# Patient Record
Sex: Female | Born: 1975 | Race: White | Hispanic: No | Marital: Single | Smoking: Never smoker
Health system: Southern US, Community
[De-identification: ages and names within clinical notes are randomized; demographics above are authoritative.]

## PROBLEM LIST (undated history)

## (undated) DIAGNOSIS — F32A Depression, unspecified: Secondary | ICD-10-CM

## (undated) DIAGNOSIS — G43909 Migraine, unspecified, not intractable, without status migrainosus: Secondary | ICD-10-CM

## (undated) DIAGNOSIS — D649 Anemia, unspecified: Secondary | ICD-10-CM

## (undated) DIAGNOSIS — R112 Nausea with vomiting, unspecified: Secondary | ICD-10-CM

## (undated) DIAGNOSIS — R011 Cardiac murmur, unspecified: Secondary | ICD-10-CM

## (undated) DIAGNOSIS — F329 Major depressive disorder, single episode, unspecified: Secondary | ICD-10-CM

## (undated) DIAGNOSIS — E039 Hypothyroidism, unspecified: Secondary | ICD-10-CM

## (undated) DIAGNOSIS — I509 Heart failure, unspecified: Secondary | ICD-10-CM

## (undated) DIAGNOSIS — Z8719 Personal history of other diseases of the digestive system: Secondary | ICD-10-CM

## (undated) DIAGNOSIS — I10 Essential (primary) hypertension: Secondary | ICD-10-CM

## (undated) DIAGNOSIS — F419 Anxiety disorder, unspecified: Secondary | ICD-10-CM

## (undated) DIAGNOSIS — Z9889 Other specified postprocedural states: Secondary | ICD-10-CM

## (undated) DIAGNOSIS — K219 Gastro-esophageal reflux disease without esophagitis: Secondary | ICD-10-CM

## (undated) HISTORY — PX: SMALL INTESTINE SURGERY: SHX150

## (undated) HISTORY — DX: Migraine, unspecified, not intractable, without status migrainosus: G43.909

## (undated) HISTORY — PX: BUNIONECTOMY: SHX129

## (undated) HISTORY — PX: CHOLECYSTECTOMY: SHX55

## (undated) HISTORY — DX: Heart failure, unspecified: I50.9

## (undated) HISTORY — DX: Cardiac murmur, unspecified: R01.1

## (undated) HISTORY — PX: HERNIA REPAIR: SHX51

## (undated) HISTORY — PX: TONSILLECTOMY: SUR1361

## (undated) HISTORY — PX: ABDOMINAL HYSTERECTOMY: SHX81

## (undated) HISTORY — PX: PARTIAL GASTRECTOMY: SHX2172

## (undated) HISTORY — PX: HIATAL HERNIA REPAIR: SHX195

---

## 1998-08-28 ENCOUNTER — Other Ambulatory Visit: Admission: RE | Admit: 1998-08-28 | Discharge: 1998-08-28 | Payer: Self-pay | Admitting: Obstetrics and Gynecology

## 1999-04-03 ENCOUNTER — Other Ambulatory Visit: Admission: RE | Admit: 1999-04-03 | Discharge: 1999-04-03 | Payer: Self-pay | Admitting: Obstetrics and Gynecology

## 1999-11-11 ENCOUNTER — Inpatient Hospital Stay (HOSPITAL_COMMUNITY): Admission: AD | Admit: 1999-11-11 | Discharge: 1999-11-14 | Payer: Self-pay | Admitting: Obstetrics and Gynecology

## 1999-12-21 ENCOUNTER — Other Ambulatory Visit: Admission: RE | Admit: 1999-12-21 | Discharge: 1999-12-21 | Payer: Self-pay | Admitting: Obstetrics and Gynecology

## 2002-06-04 ENCOUNTER — Other Ambulatory Visit: Admission: RE | Admit: 2002-06-04 | Discharge: 2002-06-04 | Payer: Self-pay | Admitting: Obstetrics and Gynecology

## 2003-07-15 ENCOUNTER — Other Ambulatory Visit: Admission: RE | Admit: 2003-07-15 | Discharge: 2003-07-15 | Payer: Self-pay | Admitting: Obstetrics and Gynecology

## 2005-04-12 ENCOUNTER — Ambulatory Visit: Payer: Self-pay | Admitting: Internal Medicine

## 2006-09-02 ENCOUNTER — Ambulatory Visit: Payer: Self-pay | Admitting: Internal Medicine

## 2006-09-19 ENCOUNTER — Ambulatory Visit: Payer: Self-pay | Admitting: Internal Medicine

## 2006-09-19 ENCOUNTER — Encounter: Payer: Self-pay | Admitting: Internal Medicine

## 2006-09-21 ENCOUNTER — Ambulatory Visit (HOSPITAL_COMMUNITY): Admission: RE | Admit: 2006-09-21 | Discharge: 2006-09-21 | Payer: Self-pay | Admitting: Internal Medicine

## 2006-10-30 ENCOUNTER — Emergency Department: Payer: Self-pay | Admitting: Emergency Medicine

## 2006-11-02 ENCOUNTER — Emergency Department: Payer: Self-pay | Admitting: Emergency Medicine

## 2006-11-06 ENCOUNTER — Emergency Department: Payer: Self-pay | Admitting: Emergency Medicine

## 2006-11-13 ENCOUNTER — Emergency Department: Payer: Self-pay | Admitting: Unknown Physician Specialty

## 2006-11-27 ENCOUNTER — Emergency Department: Payer: Self-pay | Admitting: Emergency Medicine

## 2006-12-20 ENCOUNTER — Ambulatory Visit: Payer: Self-pay | Admitting: Internal Medicine

## 2006-12-26 ENCOUNTER — Ambulatory Visit (HOSPITAL_COMMUNITY): Admission: RE | Admit: 2006-12-26 | Discharge: 2006-12-26 | Payer: Self-pay | Admitting: Surgery

## 2007-01-02 ENCOUNTER — Encounter: Admission: RE | Admit: 2007-01-02 | Discharge: 2007-02-21 | Payer: Self-pay | Admitting: Surgery

## 2007-01-05 ENCOUNTER — Ambulatory Visit (HOSPITAL_COMMUNITY): Admission: RE | Admit: 2007-01-05 | Discharge: 2007-01-05 | Payer: Self-pay | Admitting: Surgery

## 2007-01-17 ENCOUNTER — Ambulatory Visit: Payer: Self-pay | Admitting: Internal Medicine

## 2007-01-17 ENCOUNTER — Encounter: Payer: Self-pay | Admitting: Internal Medicine

## 2007-02-12 ENCOUNTER — Ambulatory Visit: Payer: Self-pay | Admitting: Internal Medicine

## 2007-03-20 ENCOUNTER — Ambulatory Visit (HOSPITAL_COMMUNITY): Admission: RE | Admit: 2007-03-20 | Discharge: 2007-03-20 | Payer: Self-pay | Admitting: Surgery

## 2007-05-31 ENCOUNTER — Ambulatory Visit (HOSPITAL_COMMUNITY): Admission: RE | Admit: 2007-05-31 | Discharge: 2007-05-31 | Payer: Self-pay | Admitting: Surgery

## 2007-09-05 ENCOUNTER — Ambulatory Visit: Payer: Self-pay | Admitting: Internal Medicine

## 2007-09-05 DIAGNOSIS — K589 Irritable bowel syndrome without diarrhea: Secondary | ICD-10-CM | POA: Insufficient documentation

## 2007-09-05 DIAGNOSIS — L409 Psoriasis, unspecified: Secondary | ICD-10-CM | POA: Insufficient documentation

## 2007-09-05 DIAGNOSIS — I10 Essential (primary) hypertension: Secondary | ICD-10-CM | POA: Insufficient documentation

## 2007-09-05 DIAGNOSIS — F341 Dysthymic disorder: Secondary | ICD-10-CM | POA: Insufficient documentation

## 2007-09-05 DIAGNOSIS — E039 Hypothyroidism, unspecified: Secondary | ICD-10-CM | POA: Insufficient documentation

## 2007-09-05 DIAGNOSIS — D131 Benign neoplasm of stomach: Secondary | ICD-10-CM | POA: Insufficient documentation

## 2007-09-05 DIAGNOSIS — A048 Other specified bacterial intestinal infections: Secondary | ICD-10-CM | POA: Insufficient documentation

## 2007-09-05 DIAGNOSIS — G43909 Migraine, unspecified, not intractable, without status migrainosus: Secondary | ICD-10-CM | POA: Insufficient documentation

## 2007-09-05 DIAGNOSIS — Z8679 Personal history of other diseases of the circulatory system: Secondary | ICD-10-CM | POA: Insufficient documentation

## 2008-05-24 HISTORY — PX: PARTIAL GASTRECTOMY: SHX2172

## 2010-02-17 ENCOUNTER — Ambulatory Visit: Payer: Self-pay

## 2010-10-06 NOTE — Assessment & Plan Note (Signed)
[facility]                         GASTROENTEROLOGY OFFICE NOTE   NAME:Carol Mahoney, Carol Mahoney                          MRN:          [id number]  DATE:12/20/2006                            DOB:          1975/08/09    CHIEF COMPLAINT:  Discuss surgery, and irritable bowel acting up.   [name] has been having problems with lower abdominal cramps and diarrhea  as with her irritable bowel before.  She is under some stress, trying to  buy a house.  There is some work stress, and she is contemplating  surgery with Dr. Ezzard Standing.  She has an intrathoracic stomach, essentially,  or at least a large portion of her stomach in her chest, with a  foreshortened esophagus.  Dr. Ezzard Standing has suggested that laparoscopic  obesity surgery with bypass would actually improve her chances of hiatal  hernia repair being more durable and also help with her life and  reducing morbidity and mortality.  She is probably going to do this.  She is concerned about the possibility of exacerbation of irritable  bowel problems.  Her mother had the surgery, so she knows that diet  restriction is an important part of that process.  She is not having any  bleeding or fever or chills.   MEDICATIONS:  1. Listed and reviewed in the chart.  2. Remains on Nexium daily.  3. Wellbutrin has been discontinued since I saw her last.  4. Not using any antispasmodics, though using some intermittent      Imodium.   PAST MEDICAL HISTORY:  See September 02, 2006 note.  See EGD, September 19, 2006, as well as upper GI series.  These show her large hiatal hernia.  There were Helicobacter pylori organisms present, September 20, 2006.  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  She is not complaining of  dysphagia at this time.   PHYSICAL EXAMINATION:  GENERAL:  Obese.  No acute distress.  VITAL SIGNS:  Weight 261 pounds, height 5 feet 7 inches, pulse 92,  blood  pressure 110/66.   ASSESSMENT:  1. Hiatal hernia, large.  2. Obesity.  3. Foreshortened esophagus.  4. Diarrhea, probably from irritable bowel.  She has never had a      colonoscopy.   PLAN:  1. I think a colonoscopy with random biopsies makes sense, given her      chronic intermittent diarrhea.  I suspect this is irritable bowel,      but if she is about to undergo the surgery mentioned above, it      would be prudent and reasonable to perform a colonoscopy and take      random biopsies.  2. Dicyclomine 10-20 mg q.6 h. p.r.n. crampy abdominal pain and      diarrhea.  She is going to take it daily right now.  3. Regarding the surgery, I think this is reasonable and actually      makes sense, given her overall problems.  I do not know of any  increased incidence of irritable bowel problems after this.  I have      told her this, though I told her she certainly could have change in      bowels and other abdominal symptoms, but usually there is an      adaptation phase.  She should ask Dr. Ezzard Standing further about this as      well.     Iva Boop, MD,FACG  Electronically Signed    CEG/MedQ  DD: 12/20/2006  DT: 12/21/2006  Job #: [id number]   cc:   [place]ell [place]. Ezzard Standing, M.D.

## 2010-10-09 NOTE — Assessment & Plan Note (Signed)
[facility]                         GASTROENTEROLOGY OFFICE NOTE   NAME:Carol Mahoney, Carol Mahoney                          MRN:          [id number]  DATE:09/02/2006                            DOB:          1976/01/18    REFERRING PHYSICIAN:  Dale Hosmer   REASON FOR CONSULTATION:  Dysphagia.   ASSESSMENT:  Dysphagia in the setting of chronic gastroesophageal reflux  disease.   RECOMMENDATIONS AND PLAN:  1. Schedule upper GI endoscopy with possible esophageal dilation. The      risks, benefits and indications are explained.  2. Continue Nexium, samples given to help her go to the b.i.d. dosing      Dr. Lorin Picket recommended.  3. She also has irritable bowel syndrome which seems to be under      reasonable control with dietary restriction of fiber i.e. low fiber      diet and occasional Imodium. Will monitor that.   HISTORY:  This is a 35 year old, white woman that I know from previous  irritable bowel and reflux disease evaluation in 2006. I have not seen  her since then. She has been to see Dr. Lorin Picket on August 17, 2006 and was  complaining of intermittent solid food dysphagia and food moving down  slowly. This has not really been a problem before. Nexium helps her  heartburn quite a bit but if she misses a dose she has a terrible acid  rebound and surge that is not pleasant to say the least. Intermittent  diarrhea as described above generally controlled by avoiding salad and  fiber, etc. Imodium will help with the diarrhea. She thinks that is  reasonable at this point. She has a stressful life being a single mom  and divorced as well as a stressful job. She has been very nauseous  lately. She says her stomach feels empty and she will end up eating and  she is gaining weight because of that. The nausea does not seem to be  related to the Nexium which she has been on for about a year. When she  was seen here previously, she was on Protonix.   MEDICATIONS:  1.  Triamterene/HCTZ 37.5/25 mg daily.  2. Synthroid 0.125 mg daily.  3. Lexapro 20 mg daily.  4. Wellbutrin XL 300 mg daily.  5. Nexium 40 mg daily.  6. Ambien CR12.5 mg 1/2 at bedtime.  7. Imitrex p.r.n.  8. Phenergan p.r.n.   ALLERGIES:  PENICILLIN and SULFA.   PAST MEDICAL HISTORY:  1. Irritable bowel syndrome.  2. Gastroesophageal reflux disease since teenage years.  Previous      endoscopy and pH probe testing in [place] (Dr. Sharolyn Douglas).  3. Hypertension.  4. Obesity.  5. Psoriasis.  6. Tonsillectomy.  7. Hypothyroidism with previous Hashimoto thyroiditis.  8. Allergies and sinus problems.  9. Prior tonsillectomy.  10.Migraine headaches.  11.Anxiety and depression.   FAMILY HISTORY:  An aunt had rectal cancer, an uncle had diabetes. No  first degree relatives with problems. Review is otherwise negative.   SOCIAL HISTORY:  She is divorced. She does drink caffeine in the form  of  coke as that helps her nausea at times. She has one daughter whose 6 and  who is strong willed. No alcohol, tobacco or drugs.   REVIEW OF SYSTEMS:  Positive for insomnia and chronic back pain. All  other systems negative except as mentioned above.   PHYSICAL EXAMINATION:  VITAL SIGNS:  Height 5 foot 7, weight 266 pounds,  blood pressure 126/80, pulse 80.  GENERAL:  She is an obese, white woman in no acute distress.  EYES:  Anicteric.  ENT:  Normal mouth, nose and pharynx.  NECK:  Supple, no thyromegaly or mass.  CHEST:  Clear.  HEART:  S1, S2. No murmurs, rubs or gallops.  ABDOMEN:  Soft, obese, nontender, no organomegaly or mass.  LYMPHATIC:  No neck or supraclavicular nodes.  EXTREMITIES:  No edema in the lower extremities.  SKIN:  Warm and dry, no rash.  PSYCH:  She is alert and oriented x3.   Note, she has this nausea problem as well as heartburn issues. I did  give her a prescription for promethazine 25 mg #12 with 1 refill to be  used as needed for severe nausea as she requested that. All  of this  could be related to reflux disease though I think there is some  irritable bowel overlay. Will need to look for the possibility of a  stricture and possible empiric dilation would be considered at her  endoscopy.   I appreciate the opportunity to care for this patient.     Iva Boop, MD,FACG  Electronically Signed    CEG/MedQ  DD: 09/02/2006  DT: 09/02/2006  Job #: [id number]   cc:   Dale Gen[place]
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
## 2011-02-01 DIAGNOSIS — W57XXXA Bitten or stung by nonvenomous insect and other nonvenomous arthropods, initial encounter: Secondary | ICD-10-CM | POA: Insufficient documentation

## 2011-02-16 DIAGNOSIS — Z9049 Acquired absence of other specified parts of digestive tract: Secondary | ICD-10-CM | POA: Insufficient documentation

## 2011-02-16 DIAGNOSIS — K449 Diaphragmatic hernia without obstruction or gangrene: Secondary | ICD-10-CM | POA: Insufficient documentation

## 2011-02-16 DIAGNOSIS — I7300 Raynaud's syndrome without gangrene: Secondary | ICD-10-CM | POA: Insufficient documentation

## 2011-02-16 DIAGNOSIS — K219 Gastro-esophageal reflux disease without esophagitis: Secondary | ICD-10-CM | POA: Insufficient documentation

## 2011-03-03 ENCOUNTER — Other Ambulatory Visit: Payer: Self-pay | Admitting: Internal Medicine

## 2011-03-04 ENCOUNTER — Inpatient Hospital Stay: Payer: Self-pay | Admitting: Specialist

## 2011-10-14 ENCOUNTER — Encounter (HOSPITAL_COMMUNITY): Payer: Self-pay | Admitting: Pharmacist

## 2011-10-20 ENCOUNTER — Encounter (HOSPITAL_COMMUNITY)
Admission: RE | Admit: 2011-10-20 | Discharge: 2011-10-20 | Disposition: A | Discharge status: Home / Self Care | Payer: Managed Care, Other (non HMO) | Source: Ambulatory Visit | Attending: Obstetrics and Gynecology | Admitting: Obstetrics and Gynecology

## 2011-10-20 ENCOUNTER — Encounter (HOSPITAL_COMMUNITY): Payer: Self-pay

## 2011-10-20 ENCOUNTER — Other Ambulatory Visit: Payer: Self-pay | Admitting: Obstetrics and Gynecology

## 2011-10-20 HISTORY — DX: Gastro-esophageal reflux disease without esophagitis: K21.9

## 2011-10-20 HISTORY — DX: Depression, unspecified: F32.A

## 2011-10-20 HISTORY — DX: Nausea with vomiting, unspecified: R11.2

## 2011-10-20 HISTORY — DX: Personal history of other diseases of the digestive system: Z87.19

## 2011-10-20 HISTORY — DX: Anxiety disorder, unspecified: F41.9

## 2011-10-20 HISTORY — DX: Major depressive disorder, single episode, unspecified: F32.9

## 2011-10-20 HISTORY — DX: Hypothyroidism, unspecified: E03.9

## 2011-10-20 HISTORY — DX: Anemia, unspecified: D64.9

## 2011-10-20 HISTORY — DX: Other specified postprocedural states: Z98.890

## 2011-10-20 HISTORY — DX: Essential (primary) hypertension: I10

## 2011-10-20 LAB — BASIC METABOLIC PANEL
BUN: 13 mg/dL (ref 6–23)
CO2: 34 mEq/L — ABNORMAL HIGH (ref 19–32)
Chloride: 96 mEq/L (ref 96–112)
GFR calc non Af Amer: >90 mL/min (ref >90)
Glucose, Bld: 76 mg/dL (ref 70–99)
Potassium: 3.2 mEq/L — ABNORMAL LOW (ref 3.5–5.1)
Sodium: 135 mEq/L (ref 135–145)

## 2011-10-20 LAB — CBC
HCT: 30.8 % — ABNORMAL LOW (ref 36.0–46.0)
Hemoglobin: 9.2 g/dL — ABNORMAL LOW (ref 12.0–15.0)
MCHC: 29.9 g/dL — ABNORMAL LOW (ref 30.0–36.0)
RBC: 4.43 MIL/uL (ref 3.87–5.11)

## 2011-10-20 LAB — SURGICAL PCR SCREEN: Staphylococcus aureus: POSITIVE — AB

## 2011-10-20 NOTE — Pre-Procedure Instructions (Signed)
I informed Dr. Arby Barrette of pt's low K+ result of 3.2 ( protocol is to notify anes.if K+ < 3.1) but surgery is week away and potential for further decrease of K+ is possible. Dr Arby Barrette instructed me to notify Dr. Billy Coast and have him order K-Dur 20 for one week. I notified pt first to obtain pharmacy info and pt prefers to have her PMD handle K+ supplement because this is a chronic issue for her & the PMD is aware of this. I notified Dr. Silvestre Mesi' office [facility]) to make them aware.

## 2011-10-20 NOTE — Pre-Procedure Instructions (Signed)
Pt is allergic to Chlorhexedine so I did not give her the Bactoshield skin prep.

## 2011-10-20 NOTE — Patient Instructions (Signed)
YOUR PROCEDURE IS SCHEDULED ON:10/29/11  ENTER THROUGH THE MAIN ENTRANCE OF [facility] AT:1145am  USE DESK PHONE AND DIAL [phone] TO INFORM us OF YOUR ARRIVAL  CALL [phone] IF YOU HAVE ANY QUESTIONS OR PROBLEMS PRIOR TO YOUR ARRIVAL.  REMEMBER: DO NOT EAT AFTER MIDNIGHT :Thursday  SPECIAL INSTRUCTIONS:clear liquids ok until 9am on Fri   YOU MAY BRUSH YOUR TEETH THE MORNING OF SURGERY   TAKE THESE MEDICINES THE DAY OF SURGERY WITH SIP OF WATER:Pristiq, Synthroid, Maxide, Protonix   DO NOT WEAR JEWELRY, EYE MAKEUP, LIPSTICK OR DARK FINGERNAIL POLISH DO NOT WEAR LOTIONS  DO NOT SHAVE FOR 48 HOURS PRIOR TO SURGERY  YOU WILL NOT BE ALLOWED TO DRIVE YOURSELF HOME.  NAME OF DRIVER:mother: Queen Blossom

## 2011-10-22 NOTE — Pre-Procedure Instructions (Signed)
Received call from pt.:( voice mail) she said her PMD wants me to call in RX to pt pharmacy,so I have left msg for Carol Mahoney to ask Dr. Billy Coast to call in rx for K-Dur as originally advised by Dr. Arby Barrette

## 2011-10-28 NOTE — H&P (Signed)
NAME:  Carol Mahoney, PAPIN NO.:  [id number]  MEDICAL RECORD NO.:  [id number]  LOCATION:  [facility]                         FACILITY:  [facility]  PHYSICIAN:  Lenoard Aden, M.D.DATE OF BIRTH:  06-08-1975  DATE OF ADMISSION:  10/12/2011 DATE OF DISCHARGE:                             HISTORY & PHYSICAL   CHIEF COMPLAINT:  Dysmenorrhea and menorrhagia.  HISTORY OF PRESENT ILLNESS:  She is a 36 year old Caucasian female G 1, P 1, who presents with worsening dysmenorrhea and menorrhagia for definitive therapy.  MEDICATIONS:  Include potassium supplementation, Pristiq, Xanax p.r.n., Protonix, HCTZ, multivitamin, Imitrex, p.r.n. and Synthroid.  She has K2, iodine, Carafate, maintain, sulfa, penicillin, ChloraPrep One-Step and vancomycin.  FAMILY HISTORY:  Rectal cancer, diabetes, varicosities.  ALLERGIES:  Normal hypertension and MI.  PAST SURGICAL HISTORY:  Vaginal delivery x1, history of cholecystectomy, partial gastrectomy, endoscopy, foot surgery and tonsillectomy.  MEDICAL HISTORY:  Remarkable for obstructive cardiomyopathy, otherwise normal cardiac preoperatively.  PHYSICAL EXAM:  General:  This is a well-developed, well-nourished white female weight female.  Height 67 inches, weight of 187 pounds.  HEENT normal.  Neck:  Supple.  Full range of motion.  Lungs:  Clear.  Heart: Regular RR.  Abdomen:  Soft, nontender.  Pelvic exam revealed an anteflexed uterus.  No adnexal masses.  Extremities:  No cords. Neurological:  Nonfocal.  Skin is intact.  IMPRESSION:  Severe dysmenorrhea, menorrhagia for definitive therapy.  PLAN:  Admit.  To proceed with TAH/bilateral salpingectomy.  Risks of anesthesia, infection, bleeding, abdominal incision is discussed. Delayed versus immediate complications to include bowel or bladder injury noted.  The cardiac records obtained discussed with the patient cleared for operative procedure.  The patient acknowledges risks of surgery and  will proceed.    Lenoard Aden, M.D.    RJT/MEDQ  D:  10/28/2011  T:  10/28/2011  Job:  [id number]

## 2011-10-29 ENCOUNTER — Encounter (HOSPITAL_COMMUNITY)
Admission: RE | Disposition: A | Discharge status: Home / Self Care | Payer: Self-pay | Source: Ambulatory Visit | Attending: Obstetrics and Gynecology

## 2011-10-29 ENCOUNTER — Ambulatory Visit (HOSPITAL_COMMUNITY)
Admission: RE | Admit: 2011-10-29 | Discharge: 2011-10-30 | Disposition: A | Discharge status: Home / Self Care | Payer: Managed Care, Other (non HMO) | Source: Ambulatory Visit | Attending: Obstetrics and Gynecology | Admitting: Obstetrics and Gynecology

## 2011-10-29 ENCOUNTER — Ambulatory Visit (HOSPITAL_COMMUNITY): Payer: Managed Care, Other (non HMO) | Admitting: Anesthesiology

## 2011-10-29 ENCOUNTER — Encounter (HOSPITAL_COMMUNITY): Payer: Self-pay | Admitting: Anesthesiology

## 2011-10-29 ENCOUNTER — Encounter (HOSPITAL_COMMUNITY): Payer: Self-pay | Admitting: *Deleted

## 2011-10-29 DIAGNOSIS — D649 Anemia, unspecified: Secondary | ICD-10-CM | POA: Insufficient documentation

## 2011-10-29 DIAGNOSIS — Z01812 Encounter for preprocedural laboratory examination: Secondary | ICD-10-CM | POA: Insufficient documentation

## 2011-10-29 DIAGNOSIS — N946 Dysmenorrhea, unspecified: Secondary | ICD-10-CM | POA: Insufficient documentation

## 2011-10-29 DIAGNOSIS — N920 Excessive and frequent menstruation with regular cycle: Secondary | ICD-10-CM | POA: Insufficient documentation

## 2011-10-29 DIAGNOSIS — I10 Essential (primary) hypertension: Secondary | ICD-10-CM | POA: Insufficient documentation

## 2011-10-29 DIAGNOSIS — N803 Endometriosis of pelvic peritoneum, unspecified: Secondary | ICD-10-CM | POA: Insufficient documentation

## 2011-10-29 DIAGNOSIS — Z01818 Encounter for other preprocedural examination: Secondary | ICD-10-CM | POA: Insufficient documentation

## 2011-10-29 LAB — ABO/RH: ABO/RH(D): A POS

## 2011-10-29 LAB — TYPE AND SCREEN
ABO/RH(D): A POS
Antibody Screen: NEGATIVE

## 2011-10-29 SURGERY — ROBOTIC ASSISTED TOTAL HYSTERECTOMY
Anesthesia: General | Site: Abdomen | Wound class: Clean Contaminated

## 2011-10-29 MED ORDER — TRAMADOL HCL 50 MG PO TABS: 50.0000 mg | ORAL_TABLET | Freq: Four times a day (QID) | ORAL | Status: DC | PRN

## 2011-10-29 MED ORDER — LACTATED RINGERS IR SOLN
Status: DC | PRN
  Administered 2011-10-29: 3000 mL

## 2011-10-29 MED ORDER — MIDAZOLAM HCL 5 MG/5ML IJ SOLN
INTRAMUSCULAR | Status: DC | PRN
  Administered 2011-10-29: 2 mg via INTRAVENOUS

## 2011-10-29 MED ORDER — LIDOCAINE HCL (CARDIAC) 20 MG/ML IV SOLN
INTRAVENOUS | Status: DC | PRN
  Administered 2011-10-29: 75 mg via INTRAVENOUS

## 2011-10-29 MED ORDER — BUPIVACAINE HCL (PF) 0.25 % IJ SOLN
INTRAMUSCULAR | Status: AC
  Filled 2011-10-29: qty 30

## 2011-10-29 MED ORDER — DEXAMETHASONE SODIUM PHOSPHATE 4 MG/ML IJ SOLN
INTRAMUSCULAR | Status: DC | PRN
  Administered 2011-10-29: 10 mg via INTRAVENOUS

## 2011-10-29 MED ORDER — HYDROMORPHONE HCL PF 1 MG/ML IJ SOLN: 0.2500 mg | INTRAMUSCULAR | Status: DC | PRN

## 2011-10-29 MED ORDER — PHENYLEPHRINE HCL 10 MG/ML IJ SOLN
INTRAMUSCULAR | Status: DC | PRN
  Administered 2011-10-29: 120 ug via INTRAVENOUS
  Administered 2011-10-29: 40 ug via INTRAVENOUS

## 2011-10-29 MED ORDER — LEVOTHYROXINE SODIUM 100 MCG PO TABS
100.0000 ug | ORAL_TABLET | Freq: Every day | ORAL | Status: DC
  Administered 2011-10-30: 100 ug via ORAL
  Filled 2011-10-29 (×2): qty 1

## 2011-10-29 MED ORDER — NEOSTIGMINE METHYLSULFATE 1 MG/ML IJ SOLN
INTRAMUSCULAR | Status: DC | PRN
  Administered 2011-10-29: 3 mg via INTRAVENOUS

## 2011-10-29 MED ORDER — ATROPINE SULFATE 0.4 MG/ML IJ SOLN
INTRAMUSCULAR | Status: DC | PRN
  Administered 2011-10-29: 0.4 mg via INTRAVENOUS

## 2011-10-29 MED ORDER — MEPERIDINE HCL 25 MG/ML IJ SOLN: 6.2500 mg | INTRAMUSCULAR | Status: DC | PRN

## 2011-10-29 MED ORDER — ZOLPIDEM TARTRATE 5 MG PO TABS: 5.0000 mg | ORAL_TABLET | Freq: Every evening | ORAL | Status: DC | PRN

## 2011-10-29 MED ORDER — CEFAZOLIN SODIUM 1-5 GM-% IV SOLN
INTRAVENOUS | Status: AC
  Administered 2011-10-29: 1 g via INTRAVENOUS
  Filled 2011-10-29: qty 50

## 2011-10-29 MED ORDER — PROPOFOL 10 MG/ML IV EMUL
INTRAVENOUS | Status: AC
  Filled 2011-10-29: qty 20

## 2011-10-29 MED ORDER — GLYCOPYRROLATE 0.2 MG/ML IJ SOLN
INTRAMUSCULAR | Status: AC
  Filled 2011-10-29: qty 1

## 2011-10-29 MED ORDER — ROCURONIUM BROMIDE 50 MG/5ML IV SOLN
INTRAVENOUS | Status: AC
  Filled 2011-10-29: qty 2

## 2011-10-29 MED ORDER — FENTANYL CITRATE 0.05 MG/ML IJ SOLN
INTRAMUSCULAR | Status: AC
  Administered 2011-10-29: 50 ug via INTRAVENOUS
  Filled 2011-10-29: qty 2

## 2011-10-29 MED ORDER — MIDAZOLAM HCL 2 MG/2ML IJ SOLN
INTRAMUSCULAR | Status: AC
  Filled 2011-10-29: qty 2

## 2011-10-29 MED ORDER — DEXAMETHASONE SODIUM PHOSPHATE 10 MG/ML IJ SOLN
INTRAMUSCULAR | Status: AC
  Filled 2011-10-29: qty 1

## 2011-10-29 MED ORDER — PHENYLEPHRINE HCL 10 MG/ML IJ SOLN
10000.0000 ug | INTRAVENOUS | Status: DC | PRN
  Administered 2011-10-29: 6.67 ug/min via INTRAVENOUS

## 2011-10-29 MED ORDER — ONDANSETRON HCL 4 MG/2ML IJ SOLN: 4.0000 mg | Freq: Four times a day (QID) | INTRAMUSCULAR | Status: DC | PRN

## 2011-10-29 MED ORDER — DIPHENHYDRAMINE HCL 50 MG/ML IJ SOLN: 12.5000 mg | Freq: Four times a day (QID) | INTRAMUSCULAR | Status: DC | PRN

## 2011-10-29 MED ORDER — OXYCODONE-ACETAMINOPHEN 5-325 MG PO TABS
1.0000 | ORAL_TABLET | ORAL | Status: DC | PRN
  Administered 2011-10-30: 1 via ORAL
  Filled 2011-10-29: qty 1

## 2011-10-29 MED ORDER — NEOSTIGMINE METHYLSULFATE 1 MG/ML IJ SOLN
INTRAMUSCULAR | Status: AC
  Filled 2011-10-29: qty 10

## 2011-10-29 MED ORDER — ROCURONIUM BROMIDE 100 MG/10ML IV SOLN
INTRAVENOUS | Status: DC | PRN
  Administered 2011-10-29 (×2): 10 mg via INTRAVENOUS
  Administered 2011-10-29: 40 mg via INTRAVENOUS

## 2011-10-29 MED ORDER — DIPHENHYDRAMINE HCL 12.5 MG/5ML PO ELIX
12.5000 mg | ORAL_SOLUTION | Freq: Four times a day (QID) | ORAL | Status: DC | PRN
  Filled 2011-10-29: qty 5

## 2011-10-29 MED ORDER — HYDROMORPHONE HCL PF 1 MG/ML IJ SOLN
INTRAMUSCULAR | Status: AC
  Administered 2011-10-29: 0.5 mg
  Filled 2011-10-29: qty 1

## 2011-10-29 MED ORDER — KCL-LACTATED RINGERS-D5W 20 MEQ/L IV SOLN
INTRAVENOUS | Status: DC
  Administered 2011-10-29 – 2011-10-30 (×2): via INTRAVENOUS
  Filled 2011-10-29 (×10): qty 1000

## 2011-10-29 MED ORDER — ONDANSETRON HCL 4 MG/2ML IJ SOLN
INTRAMUSCULAR | Status: AC
  Filled 2011-10-29: qty 2

## 2011-10-29 MED ORDER — PHENYLEPHRINE 40 MCG/ML (10ML) SYRINGE FOR IV PUSH (FOR BLOOD PRESSURE SUPPORT)
PREFILLED_SYRINGE | INTRAVENOUS | Status: AC
  Filled 2011-10-29: qty 5

## 2011-10-29 MED ORDER — SODIUM CHLORIDE 0.9 % IJ SOLN: 9.0000 mL | INTRAMUSCULAR | Status: DC | PRN

## 2011-10-29 MED ORDER — KETOROLAC TROMETHAMINE 30 MG/ML IJ SOLN
INTRAMUSCULAR | Status: DC | PRN
  Administered 2011-10-29: 30 mg via INTRAVENOUS

## 2011-10-29 MED ORDER — HYDROMORPHONE 0.3 MG/ML IV SOLN
INTRAVENOUS | Status: DC
  Administered 2011-10-29: 0.8 mg via INTRAVENOUS
  Administered 2011-10-29: 18:00:00 via INTRAVENOUS
  Administered 2011-10-30: 0.99 mg via INTRAVENOUS
  Filled 2011-10-29: qty 25

## 2011-10-29 MED ORDER — LIDOCAINE HCL (CARDIAC) 20 MG/ML IV SOLN
INTRAVENOUS | Status: AC
  Filled 2011-10-29: qty 5

## 2011-10-29 MED ORDER — GLYCOPYRROLATE 0.2 MG/ML IJ SOLN
INTRAMUSCULAR | Status: DC | PRN
  Administered 2011-10-29: .4 mg via INTRAVENOUS

## 2011-10-29 MED ORDER — ONDANSETRON HCL 4 MG/2ML IJ SOLN
INTRAMUSCULAR | Status: DC | PRN
  Administered 2011-10-29: 4 mg via INTRAVENOUS

## 2011-10-29 MED ORDER — CEFAZOLIN SODIUM 1-5 GM-% IV SOLN: 1.0000 g | INTRAVENOUS | Status: DC

## 2011-10-29 MED ORDER — BUPIVACAINE HCL (PF) 0.25 % IJ SOLN
INTRAMUSCULAR | Status: DC | PRN
  Administered 2011-10-29: 9 mL

## 2011-10-29 MED ORDER — KETOROLAC TROMETHAMINE 30 MG/ML IJ SOLN
INTRAMUSCULAR | Status: AC
  Filled 2011-10-29: qty 1

## 2011-10-29 MED ORDER — FENTANYL CITRATE 0.05 MG/ML IJ SOLN
INTRAMUSCULAR | Status: DC | PRN
  Administered 2011-10-29: 100 ug via INTRAVENOUS
  Administered 2011-10-29: 50 ug via INTRAVENOUS
  Administered 2011-10-29: 100 ug via INTRAVENOUS

## 2011-10-29 MED ORDER — METOCLOPRAMIDE HCL 5 MG/ML IJ SOLN: 10.0000 mg | Freq: Once | INTRAMUSCULAR | Status: DC | PRN

## 2011-10-29 MED ORDER — FENTANYL CITRATE 0.05 MG/ML IJ SOLN
INTRAMUSCULAR | Status: AC
  Filled 2011-10-29: qty 5

## 2011-10-29 MED ORDER — FENTANYL CITRATE 0.05 MG/ML IJ SOLN
25.0000 ug | INTRAMUSCULAR | Status: DC | PRN
  Administered 2011-10-29 (×3): 50 ug via INTRAVENOUS

## 2011-10-29 MED ORDER — LACTATED RINGERS IV SOLN
INTRAVENOUS | Status: DC | PRN
  Administered 2011-10-29 (×2): via INTRAVENOUS

## 2011-10-29 MED ORDER — NALOXONE HCL 0.4 MG/ML IJ SOLN: 0.4000 mg | INTRAMUSCULAR | Status: DC | PRN

## 2011-10-29 MED ORDER — PROPOFOL 10 MG/ML IV EMUL
INTRAVENOUS | Status: DC | PRN
  Administered 2011-10-29: 150 mg via INTRAVENOUS

## 2011-10-29 SURGICAL SUPPLY — 76 items
ADH SKN CLS APL DERMABOND .7 (GAUZE/BANDAGES/DRESSINGS) ×2
BAG URINE DRAINAGE (UROLOGICAL SUPPLIES) ×3 IMPLANT
BARRIER ADHS 3X4 INTERCEED (GAUZE/BANDAGES/DRESSINGS) IMPLANT
BRR ADH 4X3 ABS CNTRL BYND (GAUZE/BANDAGES/DRESSINGS)
CABLE HIGH FREQUENCY MONO STRZ (ELECTRODE) ×3 IMPLANT
CATH FOLEY 3WAY  5CC 16FR (CATHETERS) ×1
CATH FOLEY 3WAY 5CC 16FR (CATHETERS) ×2 IMPLANT
CHLORAPREP W/TINT 26ML (MISCELLANEOUS) ×1 IMPLANT
CLOTH BEACON ORANGE TIMEOUT ST (SAFETY) ×3 IMPLANT
CONT PATH 16OZ SNAP LID 3702 (MISCELLANEOUS) ×3 IMPLANT
COVER MAYO STAND STRL (DRAPES) ×3 IMPLANT
COVER TABLE BACK 60X90 (DRAPES) ×6 IMPLANT
COVER TIP SHEARS 8 DVNC (MISCELLANEOUS) ×2 IMPLANT
COVER TIP SHEARS 8MM DA VINCI (MISCELLANEOUS) ×1
DECANTER SPIKE VIAL GLASS SM (MISCELLANEOUS) ×3 IMPLANT
DERMABOND ADVANCED (GAUZE/BANDAGES/DRESSINGS) ×1
DERMABOND ADVANCED .7 DNX12 (GAUZE/BANDAGES/DRESSINGS) ×2 IMPLANT
DRAPE HUG U DISPOSABLE (DRAPE) ×3 IMPLANT
DRAPE LG THREE QUARTER DISP (DRAPES) ×6 IMPLANT
DRAPE MONITOR DA VINCI (DRAPE) IMPLANT
DRAPE WARM FLUID 44X44 (DRAPE) ×3 IMPLANT
ELECT REM PT RETURN 9FT ADLT (ELECTROSURGICAL) ×3
ELECTRODE REM PT RTRN 9FT ADLT (ELECTROSURGICAL) ×2 IMPLANT
EVACUATOR SMOKE 8.L (FILTER) ×3 IMPLANT
GAUZE VASELINE 3X9 (GAUZE/BANDAGES/DRESSINGS) IMPLANT
GLOVE BIO SURGEON STRL SZ7.5 (GLOVE) ×9 IMPLANT
GLOVE BIOGEL PI IND STRL 7.0 (GLOVE) ×2 IMPLANT
GLOVE BIOGEL PI IND STRL 7.5 (GLOVE) ×2 IMPLANT
GLOVE BIOGEL PI INDICATOR 7.0 (GLOVE) ×2
GLOVE BIOGEL PI INDICATOR 7.5 (GLOVE) ×2
GLOVE NEODERM STER SZ 7 (GLOVE) ×6 IMPLANT
GOWN STRL REIN XL XLG (GOWN DISPOSABLE) ×20 IMPLANT
GYRUS RUMI II 2.5CM BLUE (DISPOSABLE)
GYRUS RUMI II 3.5CM BLUE (DISPOSABLE)
GYRUS RUMI II 4.0CM BLUE (DISPOSABLE)
KIT ACCESSORY DA VINCI DISP (KITS) ×1
KIT ACCESSORY DVNC DISP (KITS) ×2 IMPLANT
KIT DISP ACCESSORY 4 ARM (KITS) IMPLANT
NDL INSUFFLATION 14GA 120MM (NEEDLE) ×1 IMPLANT
NEEDLE INSUFFLATION 14GA 120MM (NEEDLE) ×3 IMPLANT
PACK LAVH (CUSTOM PROCEDURE TRAY) ×3 IMPLANT
PAD PREP 24X48 CUFFED NSTRL (MISCELLANEOUS) ×6 IMPLANT
PLUG CATH AND CAP STER (CATHETERS) ×3 IMPLANT
PROTECTOR NERVE ULNAR (MISCELLANEOUS) ×6 IMPLANT
RUMI II 3.0CM BLUE KOH-EFFICIE (DISPOSABLE) ×2 IMPLANT
RUMI II GYRUS 2.5CM BLUE (DISPOSABLE) IMPLANT
RUMI II GYRUS 3.5CM BLUE (DISPOSABLE) IMPLANT
RUMI II GYRUS 4.0CM BLUE (DISPOSABLE) IMPLANT
SET CYSTO W/LG BORE CLAMP LF (SET/KITS/TRAYS/PACK) IMPLANT
SET IRRIG TUBING LAPAROSCOPIC (IRRIGATION / IRRIGATOR) ×3 IMPLANT
SOLUTION ELECTROLUBE (MISCELLANEOUS) ×3 IMPLANT
SPONGE LAP 18X18 X RAY DECT (DISPOSABLE) IMPLANT
SUT VIC AB 0 CT1 27 (SUTURE) ×6
SUT VIC AB 0 CT1 27XBRD ANBCTR (SUTURE) ×4 IMPLANT
SUT VIC AB 0 CT1 27XBRD ANTBC (SUTURE) IMPLANT
SUT VICRYL 0 UR6 27IN ABS (SUTURE) ×3 IMPLANT
SUT VICRYL RAPIDE 4/0 PS 2 (SUTURE) ×6 IMPLANT
SUT VLOC 180 0 9IN  GS21 (SUTURE) ×1
SUT VLOC 180 0 9IN GS21 (SUTURE) ×1 IMPLANT
SYR 50ML LL SCALE MARK (SYRINGE) ×3 IMPLANT
SYRINGE 10CC LL (SYRINGE) ×3 IMPLANT
SYSTEM CONVERTIBLE TROCAR (TROCAR) IMPLANT
TIP UTERINE 5.1X6CM LAV DISP (MISCELLANEOUS) IMPLANT
TIP UTERINE 6.7X10CM GRN DISP (MISCELLANEOUS) IMPLANT
TIP UTERINE 6.7X6CM WHT DISP (MISCELLANEOUS) IMPLANT
TIP UTERINE 6.7X8CM BLUE DISP (MISCELLANEOUS) ×2 IMPLANT
TOWEL OR 17X24 6PK STRL BLUE (TOWEL DISPOSABLE) ×9 IMPLANT
TROCAR DISP BLADELESS 8 DVNC (TROCAR) ×2 IMPLANT
TROCAR DISP BLADELESS 8MM (TROCAR) ×1
TROCAR XCEL 12X100 BLDLESS (ENDOMECHANICALS) IMPLANT
TROCAR XCEL NON-BLD 5MMX100MML (ENDOMECHANICALS) ×3 IMPLANT
TROCAR Z-THREAD 12X150 (TROCAR) ×3 IMPLANT
TROCAR Z-THREAD FIOS 12X100MM (TROCAR) IMPLANT
TUBING FILTER THERMOFLATOR (ELECTROSURGICAL) ×3 IMPLANT
WARMER LAPAROSCOPE (MISCELLANEOUS) ×3 IMPLANT
WATER STERILE IRR 1000ML POUR (IV SOLUTION) ×9 IMPLANT

## 2011-10-29 NOTE — Anesthesia Postprocedure Evaluation (Signed)
  Anesthesia Post-op Note  Patient: Carol Mahoney  Procedure(s) Performed: Procedure(s) (LRB): ROBOTIC ASSISTED TOTAL HYSTERECTOMY (N/A) ROBOTIC ASSISTED SALPINGO OOPHERECTOMY (N/A)  Patient Location: PACU  Anesthesia Type: General  Level of Consciousness: awake, alert  and oriented  Airway and Oxygen Therapy: Patient Spontanous Breathing  Post-op Pain: none  Post-op Assessment: Post-op Vital signs reviewed, Patient's Cardiovascular Status Stable, Respiratory Function Stable, Patent Airway, No signs of Nausea or vomiting and Pain level controlled  Post-op Vital Signs: Reviewed and stable  Complications: No apparent anesthesia complications

## 2011-10-29 NOTE — Progress Notes (Signed)
Patient ID: Carol Mahoney, female   DOB: Nov 23, 1975, 36 y.o.   MRN: [id number] Patient seen and examined. Consent witnessed and signed. No changes noted. Update completed.

## 2011-10-29 NOTE — Progress Notes (Signed)
PAL in Right Radial artery discontinued per order by Dr. Malen Gauze without incidence.  Pressure applied for approximately 4-5 minutes.  Pressure dressing applied after bleeding stopped.  Patient had good cap refill and pulses after discontinuation.  Tolerated well with no adverse effects.

## 2011-10-29 NOTE — OR Nursing (Signed)
Sodium Chloride 0.9% flush dispensed to field.  used.

## 2011-10-29 NOTE — Progress Notes (Signed)
Phone call to Dr Billy Coast re:PCN allergy.  Okay to give Ancef pre-op as ordered.

## 2011-10-29 NOTE — Anesthesia Preprocedure Evaluation (Signed)
Anesthesia Evaluation  Patient identified by MRN, date of birth, ID band Patient awake    Reviewed: Allergy & Precautions, H&P , Patient's Chart, lab work & pertinent test results, reviewed documented beta blocker date and time   History of Anesthesia Complications (+) PONV  Airway Mallampati: II TM Distance: >3 FB Neck ROM: full    Dental No notable dental hx.    Pulmonary  breath sounds clear to auscultation  Pulmonary exam normal       Cardiovascular hypertension, Pt. 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 Cleared by cardiologist ) Rhythm:regular Rate:Normal     Neuro/Psych    GI/Hepatic GERD-  ,  Endo/Other    Renal/GU      Musculoskeletal   Abdominal   Peds  Hematology  (+) anemia ,   Anesthesia Other Findings   Reproductive/Obstetrics                           Anesthesia Physical Anesthesia Plan  ASA: III  Anesthesia Plan: General   Post-op Pain Management:    Induction: Intravenous  Airway Management Planned: Oral ETT  Additional Equipment:   Intra-op Plan:   Post-operative Plan:   Informed Consent: I have reviewed the patients History and Physical, chart, labs and discussed the procedure including the risks, benefits and alternatives for the proposed anesthesia with the patient or authorized representative who has indicated his/her understanding and acceptance.   Dental Advisory Given  Plan Discussed with: CRNA and Surgeon  Anesthesia Plan Comments: (Plan to keep patient well hydrated, and high afterload. Plan 2 IV's, a-line, T&S for anemia.   Discussed  general anesthesia, including possible nausea, instrumentation of airway, sore throat,pulmonary aspiration, etc . I asked if the were any outstanding questions, or  concerns before we proceeded. )        Anesthesia Quick Evaluation

## 2011-10-29 NOTE — Op Note (Signed)
NAME:  Carol Mahoney, Carol Mahoney NO.:  [id number]  MEDICAL RECORD NO.:  [id number]  LOCATION:  9303                          FACILITY:  [facility]  PHYSICIAN:  Lenoard Aden, M.D.DATE OF BIRTH:  1975/11/23  DATE OF PROCEDURE: DATE OF DISCHARGE:                              OPERATIVE REPORT   SURGEON:  Lenoard Aden, MD  DESCRIPTION OF PROCEDURE:  After being apprised of risks of anesthesia, infection, bleeding, injury to abdominal organs, need for repair, delayed versus immediate complications to include bowel and bladder injury, possible need for repair, the patient was brought to the operating room and was administered general anesthetic without complications.  Prepped and draped in sterile fashion.  Foley catheter placed.  RUMI retractor was placed for vagina.  Infraumbilical incision was made with a scalpel.  Veress needle was placed, opening pressure -4 noted, 4.5 liters of CO2 was insufflated without difficulty.  Trocar was placed atraumatically.  Visualization reveals a normal atraumatic trocar entry.  Normal liver, gallbladder bled, normal appendix encased in adhesions in the right lower quadrant, but otherwise appearing normal. There was a normal-sized uterus, normal ovaries, normal anterior- posterior cul-de-sac, possible small evidence of endometriosis in the cul-de-sac.  At this time, robotic ports were placed on the right and left, 3 cm above the anterior-superior iliac spine and a 5-mm assistant port placed in the left upper quadrant.  The robot was then docked after establishing deep Trendelenburg position and standard right side docking.  At this time, attention was turned to the console whereby the ureter was identified to be peristalsing normally bilaterally.  The left tube was traced along the mesosalpinx, cauterized using the PK forceps and divided.  The retroperitoneal space was entered and the triangle bordered by the iliac vessels, the round  ligament and the infundibulopelvic vessels.  The ureter was further identified.  The tubo- ovarian ligament was clamped and cut.  The ureter was exposed on the medial leaf of the peritoneum and the round ligament was divided.  The bladder flap was developed sharply and the uterine vessels on the left were skeletonized.  On the right side, the mesosalpinx was then again cauterized and divided the triangle on the right side as anterior retroperitoneal space was entered.  The ureter was identified peristalsing along the medial leaf of the peritoneum.  The round ligament was divided, cauterized and divided.  The bladder flap was further developed.  The RUMI cup was identified after further development of the bladder flap.  The uterine vessels were bilaterally cauterized and divided.  The superior portion of the RUMI cup was cauterized in a circumferential fashion exposing the cup.  This specimen was retracted into the vagina.  The cuff was then irrigated, hemostasis was achieved and the cuff was closed using a 0 V-Loc suture, Two-layer closure.  Culdoplasty suture was placed as well for McCall culdoplasty without difficulty.  Good hemostasis was achieved.  Irrigation was accomplished.  Please note that upon removal of the specimen, the endometriosis that was apparent was removed with the specimen.  Good hemostasis was noted.  Irrigation was accomplished.  All instruments were removed directly and under direct visualization, the trocars  were also removed under direct visualization.  CO2 was released.  Good hemostasis was assured.  Vaginal exam postprocedure revealed the cuff to be well approximated.  Incisions were closed using 0 Vicryl, 4-0 Vicryl, and Dermabond.  The patient tolerated the procedure well, was awakened and transferred to recovery in good condition.     Lenoard Aden, M.D.     RJT/MEDQ  D:  10/29/2011  T:  10/29/2011  Job:  [id number]

## 2011-10-29 NOTE — Transfer of Care (Signed)
Immediate Anesthesia Transfer of Care Note  Patient: Carol Mahoney  Procedure(s) Performed: Procedure(s) (LRB): ROBOTIC ASSISTED TOTAL HYSTERECTOMY (N/A) ROBOTIC ASSISTED SALPINGO OOPHERECTOMY (N/A)  Patient Location: PACU  Anesthesia Type: General  Level of Consciousness: awake, alert  and oriented  Airway & Oxygen Therapy: Patient Spontanous Breathing and Patient connected to nasal cannula oxygen  Post-op Assessment: Report given to PACU RN and Post -op Vital signs reviewed and stable  Post vital signs: stable  Complications: No apparent anesthesia complications

## 2011-10-29 NOTE — Op Note (Signed)
10/29/2011  3:48 PM  PATIENT:  Carol Mahoney  36 y.o. female  PRE-OPERATIVE DIAGNOSIS:  Menorrhagia, Anemia  POST-OPERATIVE DIAGNOSIS:  Menorrhagia, Anemia  PROCEDURE:  Procedure(s): ROBOTIC ASSISTED TOTAL HYSTERECTOMY ROBOTIC ASSISTED SALPINGECTOMY (BILATERAL) Removal of [place] endometriosis Culdeplasty  SURGEON:  Surgeon(s): Lenoard Aden, MD  ASSISTANTSCherly Hensen MD  ANESTHESIA:   local and general  ESTIMATED BLOOD LOSS: * No blood loss amount entered *   DRAINS: Urinary Catheter (Foley)   LOCAL MEDICATIONS USED:  MARCAINE     SPECIMEN:  Source of Specimen:  uterus, cervix and tubes  DISPOSITION OF SPECIMEN:  PATHOLOGY  COUNTS:  YES  DICTATION #: [id number]  PLAN OF CARE: DC home in am  PATIENT DISPOSITION:  PACU - hemodynamically stable.

## 2011-10-30 ENCOUNTER — Encounter (HOSPITAL_COMMUNITY): Payer: Self-pay | Admitting: *Deleted

## 2011-10-30 LAB — CBC
Hemoglobin: 8.4 g/dL — ABNORMAL LOW (ref 12.0–15.0)
MCH: 20.7 pg — ABNORMAL LOW (ref 26.0–34.0)
Platelets: 152 10*3/uL (ref 150–400)
RBC: 4.06 MIL/uL (ref 3.87–5.11)
WBC: 4.3 10*3/uL (ref 4.0–10.5)

## 2011-10-30 MED ORDER — ONDANSETRON 4 MG PO TBDP: 4.0000 mg | ORAL_TABLET | Freq: Three times a day (TID) | ORAL | Status: AC | PRN

## 2011-10-30 MED ORDER — TRAMADOL HCL 50 MG PO TABS: 50.0000 mg | ORAL_TABLET | Freq: Four times a day (QID) | ORAL | Status: AC | PRN

## 2011-10-30 MED ORDER — OXYCODONE-ACETAMINOPHEN 5-325 MG PO TABS: 1.0000 | ORAL_TABLET | ORAL | Status: AC | PRN

## 2011-10-30 NOTE — Addendum Note (Signed)
Addendum  created 10/30/11 0800 by Renford Dills, CRNA   Modules edited:Notes Section

## 2011-10-30 NOTE — Progress Notes (Signed)
1 Day Post-Op Procedure(s) (LRB): ROBOTIC ASSISTED TOTAL HYSTERECTOMY (N/A) ROBOTIC ASSISTED SALPINGO OOPHERECTOMY (N/A)  Subjective: Patient reports nausea, incisional pain, tolerating PO, + flatus and no problems voiding.    Objective: BP 91/52  Pulse 75  Temp(Src) 97.9 F (36.6 C) (Oral)  Resp 17  Ht 5\' 7"  (1.702 m)  Wt 63.05 kg (139 lb)  BMI 21.77 kg/m2  SpO2 100%  LMP 10/08/2011  CBC    Component Value Date/Time   WBC 4.3 10/30/2011 0543   RBC 4.06 10/30/2011 0543   HGB 8.4* 10/30/2011 0543   HCT 28.0* 10/30/2011 0543   PLT 152 10/30/2011 0543   MCV 69.0* 10/30/2011 0543   MCH 20.7* 10/30/2011 0543   MCHC 30.0 10/30/2011 0543   RDW 15.7* 10/30/2011 0543     I have reviewed patient's vital signs, intake and output, medications and labs.  General: alert, cooperative and appears stated age Resp: clear to auscultation bilaterally and normal percussion bilaterally Cardio: regular rate and rhythm, S1, S2 normal, no murmur, click, rub or gallop GI: soft, non-tender; bowel sounds normal; no masses,  no organomegaly and incision: clean, dry and intact Extremities: extremities normal, atraumatic, no cyanosis or edema Vaginal Bleeding: minimal  Assessment: s/p Procedure(s) (LRB): ROBOTIC ASSISTED TOTAL HYSTERECTOMY (N/A) ROBOTIC ASSISTED SALPINGO OOPHERECTOMY (N/A): stable  Plan: Advance diet Encourage ambulation Advance to PO medication Discontinue IV fluids Discharge home  LOS: 1 day    Carol Mahoney 10/30/2011, 11:25 AM

## 2011-10-30 NOTE — Discharge Instructions (Signed)
Laparoscopic Assisted Vaginal Hysterectomy Care After Refer to this sheet in the next few weeks. These instructions provide you with information on caring for yourself after your procedure. Your caregiver may also give you more specific instructions. Your treatment has been planned according to current medical practices, but problems sometimes occur. Call your caregiver if you have any problems or questions after your procedure. HOME CARE INSTRUCTIONS Healing will take time. You may have discomfort, tenderness, swelling, and bruising at the surgical site for a couple of weeks. This is normal and will get better as time goes on.  Only take over-the-counter or prescription medicines for pain, discomfort, or fever as directed by your caregiver.   Do not take aspirin. It can cause bleeding.   Do not drive when taking pain medicine.   Follow your caregiver's advice regarding diet, exercise, lifting, driving, and general activities.   Resume your usual diet as directed and allowed.   Get plenty of rest and sleep.   Do not douche, use tampons, or have sexual intercourse for at least 6 weeks, or until your caregiver gives you permission.   Change your bandages (dressings) as directed by your caregiver.   Monitor your temperature and notify your caregiver of a fever.   Take showers instead of baths for 2 to 3 weeks.   Do not drink alcohol until your caregiver gives you permission.   If you develop constipation, you may take a mild laxative with your caregiver's permission. Bran foods may help with constipation problems. Drinking enough fluids to keep your urine clear or pale yellow may help as well.   Try to have someone home with you for 1 or 2 weeks to help around the house.   Keep all your follow-up appointments as directed by your caregiver.  SEEK MEDICIAL CARE IF:   You have swelling, redness, or increasing pain in the wound.   You have pus coming from the wound.   You notice a bad  smell coming from the wound or bandage (dressing).   You have swelling, redness, or pain from the intravenous (IV) site.   Your wound breaks open.   You feel dizzy or lightheaded.   You have pain or bleeding when you urinate.   You have persistent diarrhea.   You have persistent nausea and vomiting.   You have abnormal vaginal discharge.   You have a rash.   You have any type of abnormal reaction or develop an allergy to your medicine.   You have poor pain control with your prescribed medicine.  SEEK IMMEDIATE MEDICIAL CARE IF:   You have a fever 100.4 or greater.    You have severe abdominal pain.   You have chest pain.   You have shortness of breath.   You faint.   You have pain, swelling, or redness of your leg.   You have heavy vaginal bleeding with blood clots.  MAKE SURE YOU:  Understand these instructions.   Will watch your condition.   Will get help right away if you are not doing well or get worse.  Document Released: 04/29/2011 Document Reviewed: 04/26/2011 Exit[employer] Patient Information 2012 [employer]. 

## 2011-10-30 NOTE — Anesthesia Postprocedure Evaluation (Signed)
  Anesthesia Post-op Note  Patient: Carol Mahoney  Procedure(s) Performed: Procedure(s) (LRB): ROBOTIC ASSISTED TOTAL HYSTERECTOMY (N/A) ROBOTIC ASSISTED SALPINGO OOPHERECTOMY (N/A)  Patient Location: [facility]  Anesthesia Type: General  Level of Consciousness: awake  Airway and Oxygen Therapy: Patient Spontanous Breathing  Post-op Pain: mild  Post-op Assessment: Patient's Cardiovascular Status Stable and Respiratory Function Stable  Post-op Vital Signs: stable  Complications: No apparent anesthesia complications

## 2011-10-30 NOTE — Progress Notes (Signed)
Discharge instructions reviewed with patient.  Patient states understanding  Of home care and signs/symptoms to report to MD.  No home equipment needed.  Wheelchair to car with staff without incident. Discharged home with mother.

## 2012-03-10 ENCOUNTER — Other Ambulatory Visit: Payer: Self-pay | Admitting: Internal Medicine

## 2012-03-10 ENCOUNTER — Other Ambulatory Visit: Payer: Self-pay | Admitting: *Deleted

## 2012-03-10 MED ORDER — LEVOTHYROXINE SODIUM 125 MCG PO TABS: 125.0000 ug | ORAL_TABLET | Freq: Every day | ORAL | Status: DC

## 2012-03-10 MED ORDER — TRIAMTERENE-HCTZ 37.5-25 MG PO TABS: 0.5000 | ORAL_TABLET | Freq: Every day | ORAL | Status: DC

## 2012-03-10 MED ORDER — PANTOPRAZOLE SODIUM 40 MG PO TBEC: 40.0000 mg | DELAYED_RELEASE_TABLET | Freq: Every day | ORAL | Status: DC

## 2012-03-10 NOTE — Telephone Encounter (Signed)
Rx sent to pharmacy   

## 2012-03-10 NOTE — Telephone Encounter (Signed)
Ok for protonix and synthroid for 90 day supply

## 2012-03-10 NOTE — Telephone Encounter (Signed)
Pt called needing refill on  protonix 40mg  (generic) 1 daily  Synthroid 1 daily  90 day supply at time  [facility]

## 2012-03-10 NOTE — Telephone Encounter (Signed)
Rx's sent to [facility], pt informed.

## 2012-03-10 NOTE — Telephone Encounter (Signed)
Ok to refill triam/hctz 37.5/25 1/2 tab q day #30 with no refills.

## 2012-03-10 NOTE — Telephone Encounter (Signed)
R'cd fax from [facility] for refill of Triamterene/HCTZ 37.5-25 sig: take 1/2 tablet by mouth daily-pt has upcoming appointment 04/06/2012. Please advise on refill.

## 2012-04-06 ENCOUNTER — Ambulatory Visit (INDEPENDENT_AMBULATORY_CARE_PROVIDER_SITE_OTHER): Payer: Managed Care, Other (non HMO) | Admitting: Internal Medicine

## 2012-04-06 ENCOUNTER — Encounter: Payer: Self-pay | Admitting: Internal Medicine

## 2012-04-06 VITALS — BP 108/68 | HR 63 | Temp 98.1°F | Ht 67.0 in | Wt 136.5 lb

## 2012-04-06 DIAGNOSIS — R197 Diarrhea, unspecified: Secondary | ICD-10-CM

## 2012-04-06 DIAGNOSIS — D649 Anemia, unspecified: Secondary | ICD-10-CM

## 2012-04-06 DIAGNOSIS — A048 Other specified bacterial intestinal infections: Secondary | ICD-10-CM

## 2012-04-06 DIAGNOSIS — I10 Essential (primary) hypertension: Secondary | ICD-10-CM

## 2012-04-06 DIAGNOSIS — E039 Hypothyroidism, unspecified: Secondary | ICD-10-CM

## 2012-04-06 DIAGNOSIS — G43909 Migraine, unspecified, not intractable, without status migrainosus: Secondary | ICD-10-CM

## 2012-04-06 DIAGNOSIS — F341 Dysthymic disorder: Secondary | ICD-10-CM

## 2012-04-06 DIAGNOSIS — I421 Obstructive hypertrophic cardiomyopathy: Secondary | ICD-10-CM

## 2012-04-06 DIAGNOSIS — K589 Irritable bowel syndrome without diarrhea: Secondary | ICD-10-CM

## 2012-04-06 NOTE — Patient Instructions (Addendum)
It was nice seeing you today.  I am glad you have been doing ok.  Let me know if problems.

## 2012-04-09 ENCOUNTER — Encounter: Payer: Self-pay | Admitting: Internal Medicine

## 2012-04-09 DIAGNOSIS — D508 Other iron deficiency anemias: Secondary | ICD-10-CM | POA: Insufficient documentation

## 2012-04-09 DIAGNOSIS — I421 Obstructive hypertrophic cardiomyopathy: Secondary | ICD-10-CM | POA: Insufficient documentation

## 2012-04-09 NOTE — Assessment & Plan Note (Signed)
Check cbc to confirm hgb stable.     

## 2012-04-09 NOTE — Assessment & Plan Note (Signed)
Headaches are doing well.  Has imitrex if needed.  Follow.

## 2012-04-09 NOTE — Assessment & Plan Note (Signed)
On Protonix.  Follow.  

## 2012-04-09 NOTE — Assessment & Plan Note (Signed)
Intermittent flares with her bowels.  Desires referral for further evaluation.  Will refer to Dr Natalia Leatherwood.

## 2012-04-09 NOTE — Assessment & Plan Note (Signed)
Blood pressure under good control on Triam/HCTZ.  Follow.

## 2012-04-09 NOTE — Assessment & Plan Note (Signed)
On replacement.  Follow TSH.   

## 2012-04-09 NOTE — Assessment & Plan Note (Signed)
Seeing Dr Evelene Croon.  On Cymbalta now.  Has trazodone to help her sleep.  Appears to be doing better.  Still with increased family stress.  Follow.

## 2012-04-09 NOTE — Assessment & Plan Note (Signed)
Stable.  Sees cardiology at [facility].  Follow.

## 2012-04-09 NOTE — Progress Notes (Signed)
Subjective:    Patient ID: Carol Mahoney, female    DOB: May 11, 1976, 36 y.o.   MRN: [id number]  HPI 36 year old female with past history of hypothyroidism, anxiety/depression, hypertrophic obstructive cardiomyopathy and IBS who comes in today for a scheduled follow up.  She has continued to have intermittent flares with her bowels.  Has had extensive w/up in the past.  Has had EGD x 2 and colonoscopy.  Has been diagnosed with IBS.  Is s/p Roux-en-Y.  On Protonix.  Started Cymbalta two weeks ago and she feels her bowels may be a little better.  States she is not sure this is related to the medicine, because her bowel symptoms fluctuate.  She is eating and drinking.  She has had intermittent problems with hives.  First occurred at the beginning of October - after driving.  Medrol dose pack resolved.  Again reoccurred after a long drive, but has occurred in more mild flares - at other times.  She states when her bowels are better, the hives are not a problem.  No flare now.  No new soaps, detergents or other new exposures.  Still increased stress with her family situation.   Past Medical History  Diagnosis Date  . Hypertrophic obstructive cardiomyopathy     mitral regurgitation, LAE - followed at [facility]  . Hypertension   . Hypothyroidism   . Anemia   . GERD (gastroesophageal reflux disease)   . H/O hiatal hernia   . Migraine headache   . Anxiety   . Depression   . PONV (postoperative nausea and vomiting)     Review of Systems Patient denies any headache, lightheadedness or dizziness.  No chest pain, tightness or palpitations.  No increased shortness of breath, cough or congestion.  No nausea or vomiting.  Nocurrent abdominal pain or cramping.  Bowels as outlined.  No urine change.  Seeing Dr Evelene Croon.  On Cymbalta now and uses Xanax prn and Trazodone to help her sleep.       Objective:   Physical Exam Filed Vitals:   04/06/12 1157  BP: 108/68  Pulse: 63  Temp: 98.1 F (27.37 C)   36 year old  female in no acute distress.   HEENT:  Nares - clear.  OP- without lesions or erythema.  NECK:  Supple, nontender.  No audible bruit.   HEART:  Appears to be regular.  II/VI systolic murmur.   LUNGS:  Without crackles or wheezing audible.  Respirations even and unlabored.   RADIAL PULSE:  Equal bilaterally.  ABDOMEN:  Soft, nontender.  No audible abdominal bruit.   EXTREMITIES:  No increased edema to be present.                     Assessment & Plan:  HIVES.  Not present today.  No new exposures.  She did get a new car with heated seats.  States it occurs at other times - not just when she has driven on a long trip.  Will have to monitor.  Keep a diary of occurrences -so that we can see if there is a possible trigger.    GI.  Persistent intermittent flares.  Increased abdominal discomfort and loose bowel.  Better currently.  Has had EGD x 2 and colonoscopies in the past.  Was diagnosed with IBS.  Desires to have another opinion and further evaluation to confirm nothing more going on.  Will schedule an appt with Dr Natalia Leatherwood for further evaluation and treatment.  GYN.  Is s/p hysterectomy.  Sees GYN.  Had follow up 7/13.    ELECTROLYTE ABNORMALITY.  Problems with low potassium.  Recheck.  Has stopped her potassium supplements.    HEALTH MAINTENANCE.  Breasts, pelvic and pap smears are done at [facility].  States she is up to date.  Follow.

## 2012-05-08 ENCOUNTER — Telehealth: Payer: Self-pay | Admitting: Internal Medicine

## 2012-05-08 NOTE — Telephone Encounter (Signed)
The cardiologist is starting patient on new medication tomorrow. He wants an ekg done on Friday after she has been on med for a few days. Patient says that Gavin Potters will do it if you can't.

## 2012-05-08 NOTE — Telephone Encounter (Signed)
Patient wanting an EKG to be done on Friday December 20th and sent to her Cardiologist at [facility]. Can you do this for this patient.

## 2012-05-08 NOTE — Telephone Encounter (Signed)
Is she having problems.  I don't understand.  She sees cardiology regularly.  Are they seeing her.  They will probably do their own ekg.  It is not that I mind doing the EKG - just need more info - especially confirm no problems.

## 2012-05-09 NOTE — Telephone Encounter (Signed)
She needs to be seen, but I need to know who needs the ekg and to make sure someone will be there at the cardiology office to review - if we are going to do here.

## 2012-05-09 NOTE — Telephone Encounter (Signed)
Left message with appointment date and time on cell phone

## 2012-05-09 NOTE — Telephone Encounter (Signed)
She can come in at 11:45 on Friday 05/12/12.  Thanks.  Will need ekg.  Please schedule appt and notify pt

## 2012-05-09 NOTE — Telephone Encounter (Signed)
Pt called back she was having problems with pharmacy to get her rx.  She stated she needs the ekg 3 days after starting meds.  Made appointment for 12/30 to see you, but patient wanted to know if she needed to see you or can she just come in and have ekg

## 2012-05-10 NOTE — Telephone Encounter (Signed)
Dr Greig Castilla wang @ [facility].  Appointment with you dec 30 @ 2

## 2012-05-10 NOTE — Telephone Encounter (Signed)
Spoke to [place] [phone].  She said they would be in the clinic on 12.30/13 and to fax ekg to [facility] - [phone]

## 2012-05-12 ENCOUNTER — Ambulatory Visit: Payer: Managed Care, Other (non HMO) | Admitting: Internal Medicine

## 2012-05-18 ENCOUNTER — Other Ambulatory Visit: Payer: Self-pay | Admitting: Internal Medicine

## 2012-05-18 NOTE — Telephone Encounter (Signed)
Sent in to pharmacy.  

## 2012-05-22 ENCOUNTER — Encounter: Payer: Self-pay | Admitting: Internal Medicine

## 2012-05-22 ENCOUNTER — Ambulatory Visit: Payer: Managed Care, Other (non HMO) | Admitting: Internal Medicine

## 2012-05-22 ENCOUNTER — Ambulatory Visit (INDEPENDENT_AMBULATORY_CARE_PROVIDER_SITE_OTHER): Payer: Managed Care, Other (non HMO) | Admitting: Internal Medicine

## 2012-05-22 VITALS — BP 112/64 | HR 58 | Temp 97.0°F | Resp 16 | Wt 136.8 lb

## 2012-05-22 DIAGNOSIS — F341 Dysthymic disorder: Secondary | ICD-10-CM

## 2012-05-22 DIAGNOSIS — I421 Obstructive hypertrophic cardiomyopathy: Secondary | ICD-10-CM

## 2012-05-22 DIAGNOSIS — I428 Other cardiomyopathies: Secondary | ICD-10-CM

## 2012-05-22 DIAGNOSIS — D649 Anemia, unspecified: Secondary | ICD-10-CM

## 2012-05-22 DIAGNOSIS — I10 Essential (primary) hypertension: Secondary | ICD-10-CM

## 2012-05-24 ENCOUNTER — Telehealth: Payer: Self-pay | Admitting: Internal Medicine

## 2012-05-24 NOTE — Telephone Encounter (Signed)
Pt notified of [facility] labs from 05/22/12.  Hgb 9.2 (stable from last check).  Is iron deficient.  Already had GI appt.  Will start Integra.  rx called [facility] to [facility] (#[facility] with 4 refills).  Take one per day.  Recheck cbc and ferritin in one month.

## 2012-05-29 ENCOUNTER — Encounter: Payer: Self-pay | Admitting: Internal Medicine

## 2012-05-29 NOTE — Assessment & Plan Note (Signed)
Started on Norpace.  Check EKG today.  Forwarded to Dr Regino Schultze.  Discussed with his nurse practitioner regarding her ongoing symptoms.

## 2012-05-29 NOTE — Progress Notes (Signed)
Subjective:    Patient ID: Carol Mahoney, female    DOB: [birth year], 37 y.o.   MRN: [id number]  HPI 37 year old female with past history of hypothyroidism, anxiety/depression, hypertrophic obstructive cardiomyopathy and IBS who comes in today for a scheduled follow up.  She has continued to have intermittent flares with her bowels.  Has had extensive w/up in the past.  Has had EGD x 2 and colonoscopy.  Has been diagnosed with IBS.  Is s/p Roux-en-Y.  On Protonix.  States when her stomach is full, she feels more oob.  Has a follow up appt with GI at the end of the month.  hgb was recently found to be 8.9.  Has noticed some blood in her stool intermittently.   She has also been evaluated recently by Dr Regino Schultze (her cardiologist).  Was started on Norpace.  Was to follow up here today for an EKG - to confirm no change after starting the medication.  She has noticed getting a little more oob with stairs.  Some chest discomfort.  More sob walking on he treadmill.  This was discussed with Dr Regino Schultze - he decided to start Norpace.  Since starting on the medication - harder to focus.  More lethargic.    Past Medical History  Diagnosis Date  . Hypertrophic obstructive cardiomyopathy     mitral regurgitation, LAE - followed at [facility]  . Hypertension   . Hypothyroidism   . Anemia   . GERD (gastroesophageal reflux disease)   . H/O hiatal hernia   . Migraine headache   . Anxiety   . Depression   . PONV (postoperative nausea and vomiting)     Review of Systems Patient denies any headache, lightheadedness or dizziness.  [place] to focus - worsens as the day progresses.  Some chest discomfort as outlined above.  No increased shortness of breath, cough or congestion.  No nausea or vomiting.  Nocurrent abdominal pain or cramping.  Bowels as outlined.  No urine change.  Seeing Dr Evelene Croon.  On Cymbalta now and uses Xanax prn and Trazodone to help her sleep.       Objective:   Physical Exam  Filed Vitals:   05/22/12 1405    BP: 112/64  Pulse: 58  Temp: 97 F (36.1 C)  Resp: 32   37 year old female in no acute distress.   HEENT:  Nares - clear.  OP- without lesions or erythema.  NECK:  Supple, nontender.  No audible bruit.   HEART:  Appears to be regular.  II/VI systolic murmur.   LUNGS:  Without crackles or wheezing audible.  Respirations even and unlabored.   RADIAL PULSE:  Equal bilaterally.  ABDOMEN:  Soft, nontender.  No audible abdominal bruit.   EXTREMITIES:  No increased edema to be present.                     Assessment & Plan:  GI.  Persistent intermittent flares.  Increased abdominal discomfort and loose bowel.  Has had EGD x 2 and colonoscopies in the past.  Was diagnosed with IBS.  Desired to have another opinion and further evaluation to confirm nothing more going on.  Was scheduled an appt with Dr Natalia Leatherwood for further evaluation and treatment.  Scheduled an appt at the end of the month   GYN.  Is s/p hysterectomy.  Sees GYN.  Had follow up 7/13.    ELECTROLYTE ABNORMALITY.  Problems with low potassium.  Recheck was normal  HEALTH MAINTENANCE.  Breasts, pelvic and pap smears are done at [facility].  States she is up to date.  Follow.

## 2012-05-29 NOTE — Assessment & Plan Note (Signed)
Recent hgb 8.9.  Will recheck to confirm stable.  Check iron studies and B12.  Already referred to GI.

## 2012-05-29 NOTE — Assessment & Plan Note (Signed)
Blood pressure under good control.  Same medication regimen.  Check metabolic panel.    

## 2012-05-29 NOTE — Assessment & Plan Note (Signed)
Continues to see psychiatry.  Follow.

## 2012-06-02 ENCOUNTER — Telehealth: Payer: Self-pay | Admitting: Internal Medicine

## 2012-06-02 NOTE — Telephone Encounter (Signed)
She needs to call them again before stopping the medication.  Keep me posted.

## 2012-06-02 NOTE — Telephone Encounter (Signed)
Patient has not had a returned phone call from her cardiologist as of yet but she is tapering off of her Norpace 100 mg and she will keep [place] posted.

## 2012-06-02 NOTE — Telephone Encounter (Signed)
Still has not heard from the cardiologist . She is stopping the heart medication due to side effects.

## 2012-06-03 NOTE — Telephone Encounter (Signed)
Called cardiology at [facility] and left message regarding pts message and plans for stopping the norpace.  They are supposed to call back and let us know plan and follow up with pt.   Please hold message - for their call.

## 2012-06-05 ENCOUNTER — Other Ambulatory Visit: Payer: Self-pay | Admitting: General Practice

## 2012-06-05 MED ORDER — SUMATRIPTAN SUCCINATE 50 MG PO TABS: 50.0000 mg | ORAL_TABLET | ORAL | Status: DC | PRN

## 2012-06-05 MED ORDER — LEVOTHYROXINE SODIUM 100 MCG PO TABS: 100.0000 ug | ORAL_TABLET | Freq: Every day | ORAL | Status: DC

## 2012-06-12 ENCOUNTER — Encounter: Payer: Self-pay | Admitting: Internal Medicine

## 2012-06-13 ENCOUNTER — Encounter: Payer: Self-pay | Admitting: Internal Medicine

## 2012-07-05 ENCOUNTER — Telehealth: Payer: Self-pay | Admitting: Internal Medicine

## 2012-07-05 NOTE — Telephone Encounter (Signed)
Spoke with pt.  She is off her medication.  Cardiology aware.  They have scheduled a follow up appt for her.

## 2012-07-05 NOTE — Telephone Encounter (Signed)
Left message for her to call back. Dr. Lorin Picket has copy of message holding in her basket.

## 2012-07-20 DIAGNOSIS — R197 Diarrhea, unspecified: Secondary | ICD-10-CM | POA: Insufficient documentation

## 2012-08-04 ENCOUNTER — Encounter: Payer: Self-pay | Admitting: Internal Medicine

## 2012-08-04 ENCOUNTER — Ambulatory Visit (INDEPENDENT_AMBULATORY_CARE_PROVIDER_SITE_OTHER): Payer: Managed Care, Other (non HMO) | Admitting: Internal Medicine

## 2012-08-04 VITALS — BP 100/60 | HR 72 | Temp 98.4°F | Ht 67.0 in | Wt 135.2 lb

## 2012-08-04 DIAGNOSIS — I421 Obstructive hypertrophic cardiomyopathy: Secondary | ICD-10-CM

## 2012-08-04 DIAGNOSIS — D649 Anemia, unspecified: Secondary | ICD-10-CM

## 2012-08-04 DIAGNOSIS — E039 Hypothyroidism, unspecified: Secondary | ICD-10-CM

## 2012-08-04 DIAGNOSIS — F341 Dysthymic disorder: Secondary | ICD-10-CM

## 2012-08-04 DIAGNOSIS — I10 Essential (primary) hypertension: Secondary | ICD-10-CM

## 2012-08-04 MED ORDER — PANTOPRAZOLE SODIUM 40 MG PO TBEC: 40.0000 mg | DELAYED_RELEASE_TABLET | Freq: Every day | ORAL | Status: DC

## 2012-08-07 ENCOUNTER — Encounter: Payer: Self-pay | Admitting: Internal Medicine

## 2012-08-07 NOTE — Assessment & Plan Note (Signed)
Continues to see psychiatry.  Follow.

## 2012-08-07 NOTE — Assessment & Plan Note (Signed)
Has had extensive GI w/up.  Will hold on rechecking labs today.  States she just had labs through GI.  Obtain results.  States she will send to me.

## 2012-08-07 NOTE — Assessment & Plan Note (Signed)
Started on Norpace.  Did not tolerate.  She feels symptoms are stable from her last check with me.  Has follow up with Dr Regino Schultze 08/14/12.

## 2012-08-07 NOTE — Assessment & Plan Note (Signed)
On replacement.  Follow TSH.   

## 2012-08-07 NOTE — Progress Notes (Signed)
Subjective:    Patient ID: Carol Mahoney, female    DOB: [birth year], 37 y.o.   MRN: [id number]  HPI 38 year old female with past history of hypothyroidism, anxiety/depression, hypertrophic obstructive cardiomyopathy and IBS who comes in today for a scheduled follow up.  She has continued to have intermittent flares with her bowels.  Has had extensive w/up in the past.  Has had EGD x 2 and colonoscopy.  Has been diagnosed with IBS.  Is s/p Roux-en-Y.  On Protonix.  Saw Dr Evelene Croon (GI - [facility]).  He is obtaining stool studies. Also checked labs.  Planning for a hydrogen breath test 08/24/12.    She has also been evaluated recently by Dr Regino Schultze (her cardiologist).  Was started on Norpace.  Did not tolerate.  She stopped the medication.  Plans to follow up with him 08/14/12.  States she feels the cardiac symptoms are stable for her last visit with me.  Still occasionally will notice her heart flip flop.  Some questionable light headed sensation at times.  Does not occur when seated or driving.  No actual dizziness.  No headache.   No chest pain.  Breathing stable.    Past Medical History  Diagnosis Date  . Hypertrophic obstructive cardiomyopathy     mitral regurgitation, LAE - followed at [facility]  . Hypertension   . Hypothyroidism   . Anemia   . GERD (gastroesophageal reflux disease)   . H/O hiatal hernia   . Migraine headache   . Anxiety   . Depression   . PONV (postoperative nausea and vomiting)     Review of Systems Patient denies any headache.  Some minimal brief light headed sensation.  No actual dizziness.   No chest pain reported.   No increased shortness of breath, cough or congestion.  Feels her breathing is stable.  No nausea or vomiting.  No current abdominal pain or cramping.  Bowel flares intermittently.  No urine change.  Seeing Dr Evelene Croon.  On Cymbalta now and uses Xanax prn and Trazodone to help her sleep.  Seeing Dr Evelene Croon for her GI issues.  W/up planned as outlined.      Objective:   Physical Exam  Filed Vitals:   08/04/12 0829  BP: 100/60  Pulse: 72  Temp: 98.4 F (36.9 C)   Blood pressure recheck:  104/72, pulse 37  37 year old female in no acute distress.   HEENT:  Nares - clear.  OP- without lesions or erythema.  NECK:  Supple, nontender.  No audible bruit.   HEART:  Appears to be regular.  II/VI systolic murmur.   LUNGS:  Without crackles or wheezing audible.  Respirations even and unlabored.   RADIAL PULSE:  Equal bilaterally.  ABDOMEN:  Soft, nontender.  No audible abdominal bruit.   EXTREMITIES:  No increased edema to be present.                     Assessment & Plan:  GI.  Persistent intermittent flares.  Increased abdominal discomfort and loose bowel.  Has had EGD x 2 and colonoscopies in the past.  Was diagnosed with IBS.  Now seeing Dr Natalia Leatherwood (GI - [facility]).  W/up planned as outlined.    GYN.  Is s/p hysterectomy.  Sees GYN.  Had follow up 7/13.    ELECTROLYTE ABNORMALITY.  Problems with low potassium.  Recheck was normal. States labs checked through GI.  She will get me a copy of results.  Hold  on rechecking labs today.      HEALTH MAINTENANCE.  Breasts, pelvic and pap smears are done at [facility].  States she is up to date.  Follow.

## 2012-08-07 NOTE — Assessment & Plan Note (Signed)
Blood pressure under good control.  Same medication regimen.  Follow.  Obtain recent lab results.

## 2012-09-29 ENCOUNTER — Ambulatory Visit (INDEPENDENT_AMBULATORY_CARE_PROVIDER_SITE_OTHER): Payer: Managed Care, Other (non HMO) | Admitting: Internal Medicine

## 2012-09-29 ENCOUNTER — Encounter: Payer: Self-pay | Admitting: Internal Medicine

## 2012-09-29 VITALS — BP 120/70 | HR 74 | Temp 98.1°F | Ht 67.0 in | Wt 136.0 lb

## 2012-09-29 DIAGNOSIS — I421 Obstructive hypertrophic cardiomyopathy: Secondary | ICD-10-CM

## 2012-09-29 DIAGNOSIS — F341 Dysthymic disorder: Secondary | ICD-10-CM

## 2012-09-29 DIAGNOSIS — E039 Hypothyroidism, unspecified: Secondary | ICD-10-CM

## 2012-09-29 DIAGNOSIS — D649 Anemia, unspecified: Secondary | ICD-10-CM

## 2012-09-29 DIAGNOSIS — I10 Essential (primary) hypertension: Secondary | ICD-10-CM

## 2012-09-29 DIAGNOSIS — K589 Irritable bowel syndrome without diarrhea: Secondary | ICD-10-CM

## 2012-09-29 NOTE — Assessment & Plan Note (Signed)
On replacement.  Follow TSH.   

## 2012-09-29 NOTE — Progress Notes (Signed)
Subjective:    Patient ID: Carol Mahoney, female    DOB: 02-Sep-1975, 37 y.o.   MRN: [id number]  HPI 37 year old female with past history of hypothyroidism, anxiety/depression, hypertrophic obstructive cardiomyopathy and IBS who comes in today for a scheduled follow up.  She has continued to have intermittent flares with her bowels.  Has had extensive w/up in the past.  Has had EGD x 2 and colonoscopy.  Has been diagnosed with IBS.  Is s/p Roux-en-Y.  On Protonix.  Saw Dr Evelene Croon (GI - [facility]).   She has also been evaluated recently by Dr Regino Schultze (her cardiologist).  Was started on Norpace.  Did not tolerate.  She stopped the medication.  She had a recent follow up with him and he discussed open heart surgery (for the HOCM).  She had a lot of questions regarding the surgery and regarding the possibility of a mitral valve replacement as well.  Questions answered.   No chest pain.  Breathing stable.  Does get winded with exertion.     Past Medical History  Diagnosis Date  . Hypertrophic obstructive cardiomyopathy     mitral regurgitation, LAE - followed at [facility]  . Hypertension   . Hypothyroidism   . Anemia   . GERD (gastroesophageal reflux disease)   . H/O hiatal hernia   . Migraine headache   . Anxiety   . Depression   . PONV (postoperative nausea and vomiting)     Current Outpatient Prescriptions on File Prior to Visit  Medication Sig Dispense Refill  . acetaminophen (TYLENOL) 500 MG tablet Take 500 mg by mouth every 6 (six) hours as needed. For headache      . ALPRAZolam (XANAX) 0.5 MG tablet 0.5 mg 3 (three) times daily as needed.       . DULoxetine (CYMBALTA) 60 MG capsule Take 60 mg by mouth daily.      . Fe Fum-FePoly-Vit C-Vit B3 (INTEGRA) 62.5-62.5-40-3 MG CAPS Take 1 capsule by mouth daily.      Marland Kitchen levothyroxine (SYNTHROID, LEVOTHROID) 100 MCG tablet Take 100 mcg by mouth daily. Needs name brand synthyroid      . ondansetron (ZOFRAN-ODT) 4 MG disintegrating tablet Take 4 mg by mouth as  needed.      . pantoprazole (PROTONIX) 40 MG tablet Take 1 tablet (40 mg total) by mouth daily.  90 tablet  1  . SUMAtriptan (IMITREX) 50 MG tablet Take 1 tablet (50 mg total) by mouth every 2 (two) hours as needed. For migraines  54 tablet  1  . traZODone (DESYREL) 50 MG tablet 50 mg. Takes 1/2 tablet as needed at bedtime      . triamterene-hydrochlorothiazide (MAXZIDE-25) 37.5-25 MG per tablet TAKE ONE-HALF TABLET BY MOUTH DAILY  15 tablet  5   No current facility-administered medications on file prior to visit.    Review of Systems Patient denies any headache.  No chest pain reported.   No increased shortness of breath, cough or congestion.  Feels her breathing is stable.  Does get sob with exertion.  No nausea or vomiting.  No current abdominal pain or cramping.  Bowel flares intermittently.  No urine change.  Seeing Dr Evelene Croon.  On Cymbalta now and uses Xanax prn and Trazodone to help her sleep.  Seeing Dr Evelene Croon for her GI issues.  Seeing Dr Regino Schultze.  He recently discussed open heart surgery.  See above.  Questions answered.       Objective:   Physical Exam  Filed Vitals:  09/29/12 1605  BP: 120/70  Pulse: 74  Temp: 98.1 F (36.7 C)   Blood pressure recheck:  110/70, pulse 9  37 year old female in no acute distress.   HEENT:  Nares - clear.  OP- without lesions or erythema.  NECK:  Supple, nontender.  No audible bruit.   HEART:  Appears to be regular.  II/VI systolic murmur.   LUNGS:  Without crackles or wheezing audible.  Respirations even and unlabored.   RADIAL PULSE:  Equal bilaterally.  ABDOMEN:  Soft, nontender.  No audible abdominal bruit.   EXTREMITIES:  No increased edema to be present.                     Assessment & Plan:  GI.  Persistent intermittent flares.  Increased abdominal discomfort and loose bowel.  Has had EGD x 2 and colonoscopies in the past.  Was diagnosed with IBS.  Now seeing Dr Natalia Leatherwood (GI - [facility]).  W/up planned as outlined.    GYN.  Is s/p  hysterectomy.  Sees GYN.  Last evaluated 7/13.  ELECTROLYTE ABNORMALITY.  Problems with low potassium.  Recheck metabolic panel.      HEALTH MAINTENANCE.  Breasts, pelvic and pap smears are done at [facility].  States she is up to date.  Follow.

## 2012-09-29 NOTE — Assessment & Plan Note (Signed)
Has had extensive GI w/up.  Will recheck cbc.  She was on iron for a while.  Off now.  Will restart.  If unable to tolerate, will need to consider iron injections.  Will need to get her hgb built up before surgery.  Also, want to see if any symptoms improved with improved hgb.

## 2012-09-29 NOTE — Assessment & Plan Note (Signed)
Continues to see psychiatry.  Follow.   

## 2012-09-29 NOTE — Assessment & Plan Note (Signed)
Started on Norpace.  Did not tolerate.  She feels symptoms are stable from her last check with me.  Saw Dr Regino Schultze 08/14/12.   He discussed surgery.  Had lots of questions.  She will continue to follow up with him.

## 2012-09-29 NOTE — Assessment & Plan Note (Signed)
Seeing Dr Natalia Leatherwood.  Bowels stable.

## 2012-09-29 NOTE — Assessment & Plan Note (Signed)
Blood pressure under good control.  Same medication regimen.  Follow.  Check metabolic panel.   

## 2012-10-06 ENCOUNTER — Other Ambulatory Visit: Payer: Self-pay | Admitting: Internal Medicine

## 2012-10-07 LAB — CBC WITH DIFFERENTIAL
Basophils Absolute: 0.1 10*3/uL (ref 0.0–0.2)
Eosinophils Absolute: 0.1 10*3/uL (ref 0.0–0.4)
HCT: 30.1 % — ABNORMAL LOW (ref 34.0–46.6)
Lymphs: 33 % (ref 14–46)
MCH: 21.5 pg — ABNORMAL LOW (ref 26.6–33.0)
MCHC: 31.9 g/dL (ref 31.5–35.7)
Monocytes Absolute: 0.4 10*3/uL (ref 0.1–0.9)
Neutrophils Absolute: 2.3 10*3/uL (ref 1.4–7.0)
RDW: 16.5 % — ABNORMAL HIGH (ref 12.3–15.4)

## 2012-10-07 LAB — TSH: TSH: 0.694 u[IU]/mL (ref 0.450–4.500)

## 2012-10-07 LAB — COMPREHENSIVE METABOLIC PANEL
ALT: 13 IU/L (ref 0–32)
AST: 21 IU/L (ref 0–40)
Albumin/Globulin Ratio: 2.1 (ref 1.1–2.5)
Alkaline Phosphatase: 77 IU/L (ref 39–117)
BUN/Creatinine Ratio: 14 (ref 8–20)
Chloride: 96 mmol/L — ABNORMAL LOW (ref 97–108)
GFR calc Af Amer: 106 mL/min/{1.73_m2} (ref >59)
GFR calc non Af Amer: 92 mL/min/{1.73_m2} (ref >59)
Glucose: 98 mg/dL (ref 65–99)
Potassium: 3.3 mmol/L — ABNORMAL LOW (ref 3.5–5.2)
Sodium: 136 mmol/L (ref 134–144)
Total Bilirubin: 0.5 mg/dL (ref 0.0–1.2)
Total Protein: 5.9 g/dL — ABNORMAL LOW (ref 6.0–8.5)

## 2012-11-01 ENCOUNTER — Telehealth: Payer: Self-pay | Admitting: Internal Medicine

## 2012-11-01 NOTE — Telephone Encounter (Signed)
I spoke with patient & she stated that she has a hard time remembering to take the Iron. She feels like the injections would be better for her until she can get her levels back to normal, & then maybe try the pills again. She has not had any labs drawn (CBC/Ferritin) since last month. Please advise

## 2012-11-01 NOTE — Telephone Encounter (Signed)
Pt called and wanted to see if she could go ahead and get the iron shots  Please advise

## 2012-11-02 NOTE — Telephone Encounter (Signed)
Not remembering to take the iron is not an indication for iron injections.  I don't know that insurance would cover.  She needs to take the iron daily as we discussed and will then need to have a f/u cbc/ferritin in 6-8 weeks.

## 2012-11-02 NOTE — Telephone Encounter (Signed)
Pt.notified

## 2012-11-20 ENCOUNTER — Other Ambulatory Visit: Payer: Self-pay | Admitting: Internal Medicine

## 2012-12-05 ENCOUNTER — Encounter: Payer: Self-pay | Admitting: Internal Medicine

## 2012-12-05 ENCOUNTER — Ambulatory Visit (INDEPENDENT_AMBULATORY_CARE_PROVIDER_SITE_OTHER): Payer: Managed Care, Other (non HMO) | Admitting: Internal Medicine

## 2012-12-05 VITALS — BP 100/60 | HR 74 | Temp 98.3°F | Ht 67.0 in | Wt 137.5 lb

## 2012-12-05 DIAGNOSIS — E039 Hypothyroidism, unspecified: Secondary | ICD-10-CM

## 2012-12-05 DIAGNOSIS — D649 Anemia, unspecified: Secondary | ICD-10-CM

## 2012-12-05 DIAGNOSIS — F341 Dysthymic disorder: Secondary | ICD-10-CM

## 2012-12-05 DIAGNOSIS — K589 Irritable bowel syndrome without diarrhea: Secondary | ICD-10-CM

## 2012-12-05 DIAGNOSIS — I421 Obstructive hypertrophic cardiomyopathy: Secondary | ICD-10-CM

## 2012-12-05 DIAGNOSIS — I10 Essential (primary) hypertension: Secondary | ICD-10-CM

## 2012-12-05 MED ORDER — LEVOTHYROXINE SODIUM 100 MCG PO TABS: 100.0000 ug | ORAL_TABLET | Freq: Every day | ORAL | Status: DC

## 2012-12-05 MED ORDER — PANTOPRAZOLE SODIUM 40 MG PO TBEC: 40.0000 mg | DELAYED_RELEASE_TABLET | Freq: Every day | ORAL | Status: DC

## 2012-12-06 ENCOUNTER — Encounter: Payer: Self-pay | Admitting: Internal Medicine

## 2012-12-09 ENCOUNTER — Telehealth: Payer: Self-pay | Admitting: Internal Medicine

## 2012-12-09 ENCOUNTER — Encounter: Payer: Self-pay | Admitting: Internal Medicine

## 2012-12-09 NOTE — Assessment & Plan Note (Signed)
Continues to see psychiatry.  Follow.  Stable.

## 2012-12-09 NOTE — Progress Notes (Signed)
Subjective:    Patient ID: Carol Mahoney, female    DOB: 08-28-1975, 37 y.o.   MRN: [id number]  HPI 37 year old female with past history of hypothyroidism, anxiety/depression, hypertrophic obstructive cardiomyopathy and IBS who comes in today for a scheduled follow up.  She has continued to have intermittent flares with her bowels.  Has had extensive w/up in the past.  Has had EGD x 2 and colonoscopy.  Has been diagnosed with IBS.  Is s/p Roux-en-Y.  On Protonix.  Saw Dr Evelene Croon (GI - [facility]).   Has been trying to take iron supplements.  This effects her bowels.  She has also been evaluated recently by Dr Regino Schultze (her cardiologist).  Was started on Norpace.  Did not tolerate.  She stopped the medication.  She had a recent follow up with him and he discussed open heart surgery (for the HOCM).   No chest pain.  Breathing stable.  Does get winded with exertion.  She is planning to have the surgery in 11/14.  We have discussed my desire to have her hemoglobin at a higher level prior to her surgery.  She has been trying to take the iron.  Effects her bowels - as outlined.   She does report some fatigue.     Past Medical History  Diagnosis Date  . Hypertrophic obstructive cardiomyopathy     mitral regurgitation, LAE - followed at [facility]  . Hypertension   . Hypothyroidism   . Anemia   . GERD (gastroesophageal reflux disease)   . H/O hiatal hernia   . Migraine headache   . Anxiety   . Depression   . PONV (postoperative nausea and vomiting)     Current Outpatient Prescriptions on File Prior to Visit  Medication Sig Dispense Refill  . acetaminophen (TYLENOL) 500 MG tablet Take 500 mg by mouth every 6 (six) hours as needed. For headache      . ALPRAZolam (XANAX) 0.5 MG tablet 0.5 mg 3 (three) times daily as needed.       . DULoxetine (CYMBALTA) 60 MG capsule Take 60 mg by mouth daily.      . Fe Fum-FePoly-Vit C-Vit B3 (INTEGRA) 62.5-62.5-40-3 MG CAPS Take 1 capsule by mouth daily.      . ondansetron  (ZOFRAN-ODT) 4 MG disintegrating tablet Take 4 mg by mouth as needed.      . SUMAtriptan (IMITREX) 50 MG tablet Take 1 tablet (50 mg total) by mouth every 2 (two) hours as needed. For migraines  54 tablet  1  . traZODone (DESYREL) 50 MG tablet 50 mg. Takes 1/2 tablet as needed at bedtime      . triamterene-hydrochlorothiazide (MAXZIDE-25) 37.5-25 MG per tablet TAKE ONE-HALF TABLET BY MOUTH DAILY  15 tablet  5   No current facility-administered medications on file prior to visit.    Review of Systems Patient denies any headache.  No chest pain reported.   No increased shortness of breath, cough or congestion.  Feels her breathing is stable.  Does get sob with exertion.  No nausea or vomiting.  No current abdominal pain or cramping.  Bowels flare intermittently.  Worse with the iron.  No urine change.  Seeing Dr Evelene Croon.  On Cymbalta now and uses Xanax prn and Trazodone to help her sleep.  Seeing Dr Evelene Croon for her GI issues.  Seeing Dr Regino Schultze.  He recently discussed open heart surgery.  See above.        Objective:   Physical Exam  Filed Vitals:  12/05/12 1629  BP: 100/60  Pulse: 74  Temp: 98.3 F (36.8 C)   Blood pressure recheck:  71/60  37 year old female in no acute distress.   HEENT:  Nares - clear.  OP- without lesions or erythema.  NECK:  Supple, nontender.  No audible bruit.   HEART:  Appears to be regular.  II/VI systolic murmur.   LUNGS:  Without crackles or wheezing audible.  Respirations even and unlabored.   RADIAL PULSE:  Equal bilaterally.  ABDOMEN:  Soft, nontender.  No audible abdominal bruit.   EXTREMITIES:  No increased edema to be present.                     Assessment & Plan:  GI.  Persistent intermittent flares.  Supplemental iron supplements aggravate.  Has had EGD x 2 and colonoscopies in the past.  Was diagnosed with IBS.  Now seeing Dr Natalia Leatherwood (GI - [facility]).    GYN.  Is s/p hysterectomy.  Sees GYN.   ELECTROLYTE ABNORMALITY.  Problems with low potassium.   Recent potassium wnl.      HEALTH MAINTENANCE.  Breasts, pelvic and pap smears are done at [facility].  States she is up to date.  Follow.

## 2012-12-09 NOTE — Assessment & Plan Note (Signed)
Seeing Dr Natalia Leatherwood.  Bowels symptoms as outlined.  Worsens with iron supplements.

## 2012-12-09 NOTE — Assessment & Plan Note (Signed)
Blood pressure as outlined.  Would like to get her off the triam/hctz.  Have tried.  She has increased swelling when she stops.  Have tried various blood pressure and cardiac meds.  Had intolerance.  Will have her decrease the triam/hctz to qod.  Monitor blood pressure.  If low, hold.  Follow metabolic panel.

## 2012-12-09 NOTE — Assessment & Plan Note (Signed)
On replacement.  Follow TSH.   

## 2012-12-09 NOTE — Assessment & Plan Note (Signed)
Has had extensive GI w/up.  Recent hgb stable at 9.5.   Trying to take iron supplements.  Has had intolerance to multiple iron supplements.  Feel need to get her hgb built up before surgery.  Also, want to see if any symptoms improved with improved hgb.  Will refer to hematology for evaluation and treatment recommendations.  Question if she would benefit form iron injections.

## 2012-12-09 NOTE — Telephone Encounter (Signed)
Schedule a follow up appt in 3 months (30 min).  Thanks.

## 2012-12-09 NOTE — Assessment & Plan Note (Signed)
Started on Norpace.  Did not tolerate.  She feels symptoms are stable from her last check with me.  Seeing Dr Regino Schultze.  Planning for surgery as outlined.

## 2012-12-11 NOTE — Telephone Encounter (Signed)
Sent my chart message letting pt know about appointment also mailed appointment card

## 2012-12-13 ENCOUNTER — Encounter: Payer: Self-pay | Admitting: Internal Medicine

## 2012-12-18 ENCOUNTER — Ambulatory Visit: Payer: Self-pay | Admitting: Oncology

## 2012-12-22 ENCOUNTER — Ambulatory Visit: Payer: Self-pay | Admitting: Oncology

## 2013-01-01 ENCOUNTER — Other Ambulatory Visit: Payer: Self-pay | Admitting: *Deleted

## 2013-01-01 NOTE — Telephone Encounter (Signed)
What does she need this for?  This is not one of her regular medicines.  Not aware that I have prescribed.  Acute problem?

## 2013-01-01 NOTE — Telephone Encounter (Signed)
Pt called requesting a new RX for Flexeril. She states that its been a long time since she has had it filled. Old Rx no longer effective.

## 2013-01-02 NOTE — Telephone Encounter (Signed)
Left detailed message to call back.

## 2013-01-03 ENCOUNTER — Encounter: Payer: Self-pay | Admitting: *Deleted

## 2013-01-03 NOTE — Telephone Encounter (Signed)
Sent my chart message with questions.

## 2013-01-19 NOTE — Telephone Encounter (Signed)
Pt never returned my call or replied to the mychart message-denied Flexeril request

## 2013-01-22 ENCOUNTER — Ambulatory Visit: Payer: Self-pay | Admitting: Oncology

## 2013-02-19 LAB — CBC CANCER CENTER
Basophil #: 0 x10 3/mm (ref 0.0–0.1)
Basophil %: 0.3 %
Eosinophil #: 0.2 x10 3/mm (ref 0.0–0.7)
Eosinophil %: 4.3 %
HCT: 40.6 % (ref 35.0–47.0)
Lymphocyte #: 1.6 x10 3/mm (ref 1.0–3.6)
MCV: 80 fL (ref 80–100)
Monocyte #: 0.4 x10 3/mm (ref 0.2–0.9)
Monocyte %: 7.7 %
Neutrophil #: 2.6 x10 3/mm (ref 1.4–6.5)
Neutrophil %: 54.5 %
Platelet: 146 x10 3/mm — ABNORMAL LOW (ref 150–440)
RBC: 5.11 10*6/uL (ref 3.80–5.20)
RDW: 23.1 % — ABNORMAL HIGH (ref 11.5–14.5)
WBC: 4.8 x10 3/mm (ref 3.6–11.0)

## 2013-02-19 LAB — FERRITIN: Ferritin (ARMC): 42 ng/mL (ref 8–388)

## 2013-02-19 LAB — IRON AND TIBC
Iron Bind.Cap.(Total): 332 ug/dL (ref 250–450)
Unbound Iron-Bind.Cap.: 247 ug/dL

## 2013-02-21 ENCOUNTER — Ambulatory Visit: Payer: Self-pay | Admitting: Oncology

## 2013-02-27 ENCOUNTER — Encounter: Payer: Self-pay | Admitting: Internal Medicine

## 2013-03-02 ENCOUNTER — Encounter: Payer: Self-pay | Admitting: Internal Medicine

## 2013-03-13 ENCOUNTER — Ambulatory Visit (INDEPENDENT_AMBULATORY_CARE_PROVIDER_SITE_OTHER): Payer: Managed Care, Other (non HMO) | Admitting: Internal Medicine

## 2013-03-13 ENCOUNTER — Encounter: Payer: Self-pay | Admitting: Internal Medicine

## 2013-03-13 VITALS — BP 110/80 | HR 64 | Temp 98.2°F | Ht 67.0 in | Wt 139.2 lb

## 2013-03-13 DIAGNOSIS — D649 Anemia, unspecified: Secondary | ICD-10-CM

## 2013-03-13 DIAGNOSIS — K589 Irritable bowel syndrome without diarrhea: Secondary | ICD-10-CM

## 2013-03-13 DIAGNOSIS — F341 Dysthymic disorder: Secondary | ICD-10-CM

## 2013-03-13 DIAGNOSIS — Z23 Encounter for immunization: Secondary | ICD-10-CM

## 2013-03-13 DIAGNOSIS — E039 Hypothyroidism, unspecified: Secondary | ICD-10-CM

## 2013-03-13 DIAGNOSIS — G43909 Migraine, unspecified, not intractable, without status migrainosus: Secondary | ICD-10-CM

## 2013-03-13 DIAGNOSIS — I10 Essential (primary) hypertension: Secondary | ICD-10-CM

## 2013-03-13 DIAGNOSIS — I421 Obstructive hypertrophic cardiomyopathy: Secondary | ICD-10-CM

## 2013-03-13 DIAGNOSIS — A048 Other specified bacterial intestinal infections: Secondary | ICD-10-CM

## 2013-03-13 NOTE — Progress Notes (Signed)
Subjective:    Patient ID: Carol Mahoney, female    DOB: May 17, 1976, 37 y.o.   MRN: [id number]  HPI 37 year old female with past history of hypothyroidism, anxiety/depression, hypertrophic obstructive cardiomyopathy and IBS who comes in today for a scheduled follow up.  She has continued to have intermittent flares with her bowels.  Has had extensive w/up in the past.  Has had EGD x 2 and colonoscopy.  Has been diagnosed with IBS.  Is s/p Roux-en-Y.  On Protonix.  Saw Carol Mahoney (GI - [facility]).   She has also been evaluated recently by Carol Mahoney (her cardiologist).  Was started on Norpace.  Did not tolerate.  She stopped the medication.  She had a recent follow up with him and he discussed open heart surgery (for the HOCM).   This is planned for November.  Does get winded with exertion.  She has been under an increased amount or stress recently.  When stressed, she has more physical symptoms.  Sees Carol Mahoney.  Planning to discuss with her.  Bowels are worse with increased stress.  She did see Carol Mahoney.  Is s/p iron infusion.  Hemoglobin now in the normal range.  Headaches have been more frequent with the increased stress as well.  We discussed this at length.  Discussed need to limit imitrex usage, especially with her underlying heart issues, etc.       Past Medical History  Diagnosis Date  . Hypertrophic obstructive cardiomyopathy     mitral regurgitation, LAE - followed at [facility]  . Hypertension   . Hypothyroidism   . Anemia   . GERD (gastroesophageal reflux disease)   . H/O hiatal hernia   . Migraine headache   . Anxiety   . Depression   . PONV (postoperative nausea and vomiting)     Current Outpatient Prescriptions on File Prior to Visit  Medication Sig Dispense Refill  . acetaminophen (TYLENOL) 500 MG tablet Take 500 mg by mouth every 6 (six) hours as needed. For headache      . ALPRAZolam (XANAX) 0.5 MG tablet 0.5 mg 3 (three) times daily as needed.       . DULoxetine (CYMBALTA) 60 MG capsule  Take 60 mg by mouth daily.      Marland Kitchen levothyroxine (SYNTHROID) 100 MCG tablet Take 1 tablet (100 mcg total) by mouth daily before breakfast. Needs name brand synthroid only  90 tablet  3  . ondansetron (ZOFRAN-ODT) 4 MG disintegrating tablet Take 4 mg by mouth as needed.      . pantoprazole (PROTONIX) 40 MG tablet Take 1 tablet (40 mg total) by mouth daily.  90 tablet  1  . SUMAtriptan (IMITREX) 50 MG tablet Take 1 tablet (50 mg total) by mouth every 2 (two) hours as needed. For migraines  54 tablet  1  . traZODone (DESYREL) 50 MG tablet 50 mg. Takes 1/2 tablet as needed at bedtime      . triamterene-hydrochlorothiazide (MAXZIDE-25) 37.5-25 MG per tablet TAKE ONE-HALF TABLET BY MOUTH DAILY  15 tablet  5   No current facility-administered medications on file prior to visit.    Review of Systems increased frequency of headaches as outlined.   Increased stress.  Some increased heart racing with increased stress and anxiety.   Feels her breathing is stable.  Does get sob with exertion.  No nausea or vomiting.  No current abdominal pain or cramping.  Bowels flare intermittently.  Worse with stress.  No urine change.  Seeing  Carol Mahoney.  On Cymbalta now and uses Xanax prn and Trazodone to help her sleep.  Seeing Carol Mahoney for her GI issues.  Seeing Carol Mahoney.  He recently discussed open heart surgery.  Planning to have the surgery in November.  See above.  She tried to take the triam/hctz on an every other day schedule.  Had increased lower extremity swelling.       Objective:   Physical Exam  Filed Vitals:   03/13/13 1432  BP: 110/80  Pulse: 64  Temp: 98.2 F (39.17 C)   37 year old female in no acute distress.   HEENT:  Nares - clear.  OP- without lesions or erythema.  NECK:  Supple, nontender.  No audible bruit.   HEART:  Appears to be regular.  II/VI systolic murmur.   LUNGS:  Without crackles or wheezing audible.  Respirations even and unlabored.   RADIAL PULSE:  Equal bilaterally.  ABDOMEN:   Soft, nontender.  No audible abdominal bruit.   EXTREMITIES:  No increased edema to be present.  Stable.                    Assessment & Plan:  GI.  Persistent intermittent flares.   Has had EGD x 2 and colonoscopies in the past.  Was diagnosed with IBS.  Now seeing Carol Mahoney (GI - [facility]).    GYN.  Is s/p hysterectomy.  Sees GYN.   ELECTROLYTE ABNORMALITY.  Problems with low potassium.  Recent potassium wnl.   Recheck to confirm remaining normal.    HEALTH MAINTENANCE.  Breasts, pelvic and pap smears are done at [facility].  States she is up to date.  Follow.

## 2013-03-16 ENCOUNTER — Telehealth: Payer: Self-pay | Admitting: Internal Medicine

## 2013-03-16 NOTE — Telephone Encounter (Signed)
Pt notified of lab results via mychart. 

## 2013-03-18 ENCOUNTER — Encounter: Payer: Self-pay | Admitting: Internal Medicine

## 2013-03-18 NOTE — Assessment & Plan Note (Signed)
On replacement.  Follow TSH.   

## 2013-03-18 NOTE — Assessment & Plan Note (Signed)
More frequent.  She relates to the increased stress.  Have her f/u with Dr Evelene Croon.  Limit/stop imitrex.  Follow.

## 2013-03-18 NOTE — Assessment & Plan Note (Signed)
On Protonix.  Follow.  

## 2013-03-18 NOTE — Assessment & Plan Note (Signed)
Started on Norpace.  Did not tolerate.  Seeing Dr Regino Schultze.  Planning for surgery as outlined.   Treat stress as outlined.

## 2013-03-18 NOTE — Assessment & Plan Note (Signed)
Seeing Dr Andy Wolff.  Bowels symptoms as outlined.  Worsens with increased stress.  Follow.    

## 2013-03-18 NOTE — Assessment & Plan Note (Signed)
Continues to see psychiatry. Is followed by Dr Evelene Croon.  Discussed at length with her today.  She is going to follow up with Dr Evelene Croon.

## 2013-03-18 NOTE — Assessment & Plan Note (Signed)
Blood pressure as outlined.  Would like to get her off the triam/hctz.  Have tried.  She has increased swelling when she stops.  Have tried various blood pressure and cardiac meds.  Had intolerance. Follow.

## 2013-03-18 NOTE — Assessment & Plan Note (Signed)
Has had extensive GI w/up.  Is s/p iron infusion.  hgb normal now.  Follow.

## 2013-04-05 ENCOUNTER — Encounter: Payer: Self-pay | Admitting: Internal Medicine

## 2013-04-11 ENCOUNTER — Telehealth: Payer: Self-pay | Admitting: *Deleted

## 2013-04-11 DIAGNOSIS — Z9889 Other specified postprocedural states: Secondary | ICD-10-CM | POA: Insufficient documentation

## 2013-04-11 NOTE — Telephone Encounter (Signed)
Noted  

## 2013-04-11 NOTE — Telephone Encounter (Signed)
Mother called, wanted to update you on her surgery-had it yesterday (long & complicated) had to have a few units of blood. She is only complaining of the chest tubes hurting. They will continue to keep is updated.

## 2013-04-23 ENCOUNTER — Telehealth: Payer: Self-pay | Admitting: Internal Medicine

## 2013-04-23 NOTE — Telephone Encounter (Signed)
Please schedule her for 11:45 on 05/07/13.   Thanks.

## 2013-04-23 NOTE — Telephone Encounter (Signed)
Pt had open heart surgery 11/18.  Needs f/u within next two weeks.  No slots available.  Please advise.

## 2013-04-23 NOTE — Telephone Encounter (Signed)
Please advise 

## 2013-04-24 NOTE — Telephone Encounter (Signed)
Pt aware of appointment 

## 2013-05-07 ENCOUNTER — Ambulatory Visit (INDEPENDENT_AMBULATORY_CARE_PROVIDER_SITE_OTHER): Payer: Managed Care, Other (non HMO) | Admitting: Internal Medicine

## 2013-05-07 ENCOUNTER — Encounter: Payer: Self-pay | Admitting: Internal Medicine

## 2013-05-07 VITALS — BP 96/56 | HR 89 | Temp 98.2°F | Resp 12 | Ht 67.0 in | Wt 136.0 lb

## 2013-05-07 DIAGNOSIS — K589 Irritable bowel syndrome without diarrhea: Secondary | ICD-10-CM

## 2013-05-07 DIAGNOSIS — D649 Anemia, unspecified: Secondary | ICD-10-CM

## 2013-05-07 DIAGNOSIS — M898X1 Other specified disorders of bone, shoulder: Secondary | ICD-10-CM | POA: Insufficient documentation

## 2013-05-07 DIAGNOSIS — E039 Hypothyroidism, unspecified: Secondary | ICD-10-CM

## 2013-05-07 DIAGNOSIS — G43909 Migraine, unspecified, not intractable, without status migrainosus: Secondary | ICD-10-CM

## 2013-05-07 DIAGNOSIS — A048 Other specified bacterial intestinal infections: Secondary | ICD-10-CM

## 2013-05-07 DIAGNOSIS — F341 Dysthymic disorder: Secondary | ICD-10-CM

## 2013-05-07 DIAGNOSIS — M25512 Pain in left shoulder: Secondary | ICD-10-CM

## 2013-05-07 DIAGNOSIS — M899 Disorder of bone, unspecified: Secondary | ICD-10-CM

## 2013-05-07 DIAGNOSIS — I421 Obstructive hypertrophic cardiomyopathy: Secondary | ICD-10-CM

## 2013-05-07 DIAGNOSIS — I10 Essential (primary) hypertension: Secondary | ICD-10-CM

## 2013-05-07 NOTE — Assessment & Plan Note (Signed)
Has had extensive GI w/up.  Is s/p iron infusion.  hgb was normal.  Some decrease during surgery requiring transfusion.  Follow.

## 2013-05-07 NOTE — Assessment & Plan Note (Signed)
S/p surgery as outlined.  On metoprolol.  Due to follow up with Dr Regino Schultze next month.

## 2013-05-07 NOTE — Progress Notes (Signed)
Subjective:    Patient ID: Carol Mahoney, female    DOB: Oct 31, 1975, 37 y.o.   MRN: [id number]  HPI 37 year old female with past history of hypothyroidism, anxiety/depression, hypertrophic obstructive cardiomyopathy and IBS who comes in today for a hospital follow up.   She just recently had open heart surgery - myectomy septum and mitral plasty.  Surgery performed by Dr Silvestre Mesi.  She has been placed on metoprolol and her dose has been decreased to 12.5mg  bid.   She is still on her triam/hctz.  Has f/u with Dr Regino Schultze 06/18/13.  Since her surgery she has had pain in her left collar bon.  No pain with palpation.  Only certain movements aggravate.  Still recovering from her surgery.   She has continued to have intermittent flares with her bowels.  Has had extensive w/up in the past.  Has had EGD x 2 and colonoscopy.  Has been diagnosed with IBS.  Is s/p Roux-en-Y.  On Protonix.  Saw Dr Evelene Croon (GI - [facility]).  She has been under an increased amount or stress recently.  When stressed, she has more physical symptoms.  Sees Dr Evelene Croon.  Most of her stress is related to her daughter issues.  Daughter seeing a counselor and psychiatrist.        Past Medical History  Diagnosis Date  . Hypertrophic obstructive cardiomyopathy     mitral regurgitation, LAE - followed at [facility]  . Hypertension   . Hypothyroidism   . Anemia   . GERD (gastroesophageal reflux disease)   . H/O hiatal hernia   . Migraine headache   . Anxiety   . Depression   . PONV (postoperative nausea and vomiting)     Current Outpatient Prescriptions on File Prior to Visit  Medication Sig Dispense Refill  . acetaminophen (TYLENOL) 500 MG tablet Take 500 mg by mouth every 6 (six) hours as needed. For headache      . ALPRAZolam (XANAX) 0.5 MG tablet 0.5 mg 3 (three) times daily as needed.       . cyclobenzaprine (FLEXERIL) 10 MG tablet Take 10 mg by mouth 3 (three) times daily as needed for muscle spasms.      . DULoxetine (CYMBALTA) 60 MG capsule Take  60 mg by mouth daily.      Marland Kitchen levothyroxine (SYNTHROID) 100 MCG tablet Take 1 tablet (100 mcg total) by mouth daily before breakfast. Needs name brand synthroid only  90 tablet  3  . ondansetron (ZOFRAN-ODT) 4 MG disintegrating tablet Take 4 mg by mouth as needed.      . pantoprazole (PROTONIX) 40 MG tablet Take 1 tablet (40 mg total) by mouth daily.  90 tablet  1  . SUMAtriptan (IMITREX) 50 MG tablet Take 1 tablet (50 mg total) by mouth every 2 (two) hours as needed. For migraines  54 tablet  1  . traZODone (DESYREL) 50 MG tablet 50 mg. Takes 1/2 tablet as needed at bedtime      . triamterene-hydrochlorothiazide (MAXZIDE-25) 37.5-25 MG per tablet TAKE ONE-HALF TABLET BY MOUTH DAILY  15 tablet  5   No current facility-administered medications on file prior to visit.    Review of Systems Increased stress.  Some increased heart racing with increased stress and anxiety.   Feels her breathing is stable.  No nausea or vomiting.  No current abdominal pain or cramping.  Bowels flare intermittently.  Worse with stress.  No urine change.  Seeing Dr Evelene Croon.  On Cymbalta now and uses Xanax  prn and Trazodone to help her sleep.  Seeing Dr Evelene Croon for her GI issues.  Seeing Dr Regino Schultze.  Is s/p surgery as outlined above.  On low dose metoprolol.   She tried to take the triam/hctz on an every other day schedule.  Had increased lower extremity swelling.  No increased swelling now.  Left collar bone pain as outlined.       Objective:   Physical Exam  Filed Vitals:   05/07/13 1148  BP: 96/56  Pulse: 89  Temp: 98.2 F (36.8 C)  Resp: 12   Blood pressure recheck;  53/57  37 year old female in no acute distress.   HEENT:  Nares - clear.  OP- without lesions or erythema.  NECK:  Supple, nontender.  No audible bruit.   HEART:  Appears to be regular.  II/VI systolic murmur.   LUNGS:  Without crackles or wheezing audible.  Respirations even and unlabored.   RADIAL PULSE:  Equal bilaterally.  ABDOMEN:  Soft,  nontender.  No audible abdominal bruit.   EXTREMITIES:  No increased edema to be present.  Stable.                    Assessment & Plan:  GI.  Persistent intermittent flares.   Has had EGD x 2 and colonoscopies in the past.  Was diagnosed with IBS.  Now seeing Dr Natalia Leatherwood (GI - [facility]).    GYN.  Is s/p hysterectomy.  Sees GYN.   ELECTROLYTE ABNORMALITY.  Problems with low potassium.  Not on potassium supplements now.  Just checked at [facility].     HEALTH MAINTENANCE.  Breasts, pelvic and pap smears are done at [facility].  States she is up to date.  Follow.

## 2013-05-07 NOTE — Assessment & Plan Note (Signed)
Not reported as an issue today.  Have discussed the need to limit/stop imitrex.  Follow.

## 2013-05-07 NOTE — Assessment & Plan Note (Signed)
Pain as outlined.  No pain to palpation.  Only certain movements aggravate.  Discussed further w/up.  She wants to monitor for now.  Follow.

## 2013-05-07 NOTE — Assessment & Plan Note (Signed)
Continues to see psychiatry. Is followed by Dr Kaur.  Discussed at length with her today.    

## 2013-05-07 NOTE — Progress Notes (Signed)
Pre visit review using our clinic review tool, if applicable. No additional management support is needed unless otherwise documented below in the visit note. 

## 2013-05-07 NOTE — Assessment & Plan Note (Signed)
Seeing Dr Andy Wolff.  Bowels symptoms as outlined.  Worsens with increased stress.  Follow.    

## 2013-05-07 NOTE — Assessment & Plan Note (Signed)
On Protonix.  Follow.  

## 2013-05-07 NOTE — Assessment & Plan Note (Signed)
On replacement.  Follow TSH.   

## 2013-05-07 NOTE — Assessment & Plan Note (Signed)
Blood pressure as outlined.  Would like to get her off the triam/hctz.  Have tried.  She has increased swelling when she stops.  Have tried various blood pressure and cardiac meds.  Had intolerance.  Now on low dose metoprolol.  Follow.

## 2013-05-23 ENCOUNTER — Ambulatory Visit: Payer: Managed Care, Other (non HMO) | Admitting: Internal Medicine

## 2013-06-02 ENCOUNTER — Other Ambulatory Visit: Payer: Self-pay | Admitting: Internal Medicine

## 2013-06-12 ENCOUNTER — Ambulatory Visit: Payer: Self-pay | Admitting: Oncology

## 2013-06-21 ENCOUNTER — Encounter: Payer: Self-pay | Admitting: Internal Medicine

## 2013-06-21 ENCOUNTER — Ambulatory Visit (INDEPENDENT_AMBULATORY_CARE_PROVIDER_SITE_OTHER): Payer: 59 | Admitting: Internal Medicine

## 2013-06-21 VITALS — BP 100/70 | HR 86 | Temp 97.9°F | Ht 67.0 in | Wt 129.0 lb

## 2013-06-21 DIAGNOSIS — E039 Hypothyroidism, unspecified: Secondary | ICD-10-CM

## 2013-06-21 DIAGNOSIS — F341 Dysthymic disorder: Secondary | ICD-10-CM

## 2013-06-21 DIAGNOSIS — I421 Obstructive hypertrophic cardiomyopathy: Secondary | ICD-10-CM

## 2013-06-21 DIAGNOSIS — I10 Essential (primary) hypertension: Secondary | ICD-10-CM

## 2013-06-21 DIAGNOSIS — K589 Irritable bowel syndrome without diarrhea: Secondary | ICD-10-CM

## 2013-06-21 DIAGNOSIS — D649 Anemia, unspecified: Secondary | ICD-10-CM

## 2013-06-21 DIAGNOSIS — G43909 Migraine, unspecified, not intractable, without status migrainosus: Secondary | ICD-10-CM

## 2013-06-21 DIAGNOSIS — A048 Other specified bacterial intestinal infections: Secondary | ICD-10-CM

## 2013-06-21 MED ORDER — LEVOTHYROXINE SODIUM 100 MCG PO TABS: 100.0000 ug | ORAL_TABLET | Freq: Every day | ORAL | Status: DC

## 2013-06-21 MED ORDER — PANTOPRAZOLE SODIUM 40 MG PO TBEC: 40.0000 mg | DELAYED_RELEASE_TABLET | Freq: Every day | ORAL | Status: DC

## 2013-06-21 NOTE — Assessment & Plan Note (Addendum)
Blood pressure as outlined.  Off triam/hctz.  Discussed with her today regarding the need to remain off.  Follow.

## 2013-06-21 NOTE — Progress Notes (Signed)
Subjective:    Patient ID: Carol Mahoney, female    DOB: Oct 03, 1975, 38 y.o.   MRN: [id number]  HPI 38 year old female with past history of hypothyroidism, anxiety/depression, hypertrophic obstructive cardiomyopathy and IBS who comes in today for a scheduled follow up.   She just recently had open heart surgery - myectomy septum and mitral plasty.  Surgery performed by Dr Evelina Dun.  She is on metoprolol.   Had f/u with Dr Mina Marble 06/18/13.  He stopped her triam/hctz.  She is wearing support hose.   Wearing a 30 day event monitor now.  Off aspirin.  Has f/u in 3 months.  She has continued to have intermittent flares with her bowels.  Has had extensive w/up in the past.  Has had EGD x 2 and colonoscopy.  Has been diagnosed with IBS. Has been flaring more recently.   Is s/p Roux-en-Y.  On Protonix.  Saw Dr Yves Dill ([place]).  She has been under an increased amount or stress recently.  When stressed, she has more physical symptoms.  Sees Dr Toy Care.  Most of her stress is related to her daughter issues.  Daughter seeing a counselor and psychiatrist.  Planning to start cardiac rehab.  Working part time now.  Has a history of headaches.  Not frequent now.       Past Medical History  Diagnosis Date  . Hypertrophic obstructive cardiomyopathy     mitral regurgitation, LAE - followed at [facility]  . Hypertension   . Hypothyroidism   . Anemia   . GERD (gastroesophageal reflux disease)   . H/O hiatal hernia   . Migraine headache   . Anxiety   . Depression   . PONV (postoperative nausea and vomiting)     Current Outpatient Prescriptions on File Prior to Visit  Medication Sig Dispense Refill  . acetaminophen (TYLENOL) 500 MG tablet Take 500 mg by mouth every 6 (six) hours as needed. For headache      . ALPRAZolam (XANAX) 0.5 MG tablet 0.5 mg 3 (three) times daily as needed.       . cyclobenzaprine (FLEXERIL) 10 MG tablet Take 10 mg by mouth 3 (three) times daily as needed for muscle spasms.      . DULoxetine  (CYMBALTA) 60 MG capsule Take 60 mg by mouth daily.      Marland Kitchen levothyroxine (SYNTHROID) 100 MCG tablet Take 1 tablet (100 mcg total) by mouth daily before breakfast. Needs name brand synthroid only  90 tablet  3  . metoprolol tartrate (LOPRESSOR) 25 MG tablet Take 12.5 mg by mouth 2 (two) times daily.      . ondansetron (ZOFRAN-ODT) 4 MG disintegrating tablet Take 4 mg by mouth as needed.      . pantoprazole (PROTONIX) 40 MG tablet Take 1 tablet (40 mg total) by mouth daily.  90 tablet  1  . SUMAtriptan (IMITREX) 50 MG tablet Take 1 tablet (50 mg total) by mouth every 2 (two) hours as needed. For migraines  54 tablet  1  . traZODone (DESYREL) 50 MG tablet 50 mg. Takes 1/2 tablet as needed at bedtime      . triamterene-hydrochlorothiazide (MAXZIDE-25) 37.5-25 MG per tablet TAKE ONE-HALF TABLET BY MOUTH DAILY   15 tablet  4   No current facility-administered medications on file prior to visit.    Review of Systems Increased stress.  Some increased heart racing with increased stress and anxiety.   Feels her breathing is stable.  No nausea or vomiting.  No current abdominal pain or cramping.  Bowels flare intermittently.  Worse with stress.  No urine change.  Seeing Dr Toy Care.  On Cymbalta now and uses Xanax prn and Trazodone to help her sleep.  Seeing Dr Yves Dill for her GI issues.  Seeing Dr Mina Marble.  Is s/p surgery as outlined above.  On low dose metoprolol.  Off triam/hctz now.  wearing support hose.        Objective:   Physical Exam  Filed Vitals:   06/21/13 0803  BP: 100/70  Pulse: 86  Temp: 97.9 F (36.6 C)   Blood pressure recheck;  71/63  38 year old female in no acute distress.   HEENT:  Nares - clear.  OP- without lesions or erythema.  NECK:  Supple, nontender.  No audible bruit.   HEART:  Appears to be regular.  II/VI systolic murmur.   LUNGS:  Without crackles or wheezing audible.  Respirations even and unlabored.   RADIAL PULSE:  Equal bilaterally.  ABDOMEN:  Soft, nontender.  No  audible abdominal bruit.   EXTREMITIES:  No increased edema to be present.  Stable.                    Assessment & Plan:  GI.  Persistent intermittent flares.   Has had EGD x 2 and colonoscopies in the past.  Was diagnosed with IBS.  Now seeing Dr Sigurd Sos (GI - [facility]).    GYN.  Is s/p hysterectomy.  Sees GYN.   ELECTROLYTE ABNORMALITY.  Problems with low potassium.  Not on potassium supplements now.  Just checked at [facility].   Should be better off triam/hctz.    HEALTH MAINTENANCE.  Breasts, pelvic and pap smears are done at [facility].  States she is up to date.  Follow.

## 2013-06-21 NOTE — Assessment & Plan Note (Addendum)
S/p surgery as outlined.  On metoprolol.  Saw Dr Mina Marble this week.  He stopped her triam/hctz.  Wearing support hose.  Also wearing a 30 day event monitor.  Follow.

## 2013-06-21 NOTE — Assessment & Plan Note (Addendum)
Not as frequent.  Have discussed the need to limit/stop imitrex.  Follow.   

## 2013-06-21 NOTE — Assessment & Plan Note (Addendum)
Has had extensive GI w/up.  Is s/p iron infusion.  hgb was normal.  Some decrease during surgery requiring transfusion.  Follow.  Hgb 06/07/13 wnl.

## 2013-06-21 NOTE — Progress Notes (Signed)
Pre-visit discussion using our clinic review tool. No additional management support is needed unless otherwise documented below in the visit note.  

## 2013-06-24 ENCOUNTER — Encounter: Payer: Self-pay | Admitting: Internal Medicine

## 2013-06-24 ENCOUNTER — Ambulatory Visit: Payer: Self-pay | Admitting: Oncology

## 2013-06-24 NOTE — Assessment & Plan Note (Signed)
Seeing Dr Sigurd Sos.  Bowels symptoms as outlined.  Worsens with increased stress.  Follow.

## 2013-06-24 NOTE — Assessment & Plan Note (Signed)
Continues to see psychiatry. Is followed by Dr Toy Care.  Discussed at length with her today.

## 2013-06-24 NOTE — Assessment & Plan Note (Signed)
On replacement.  Follow TSH.   

## 2013-06-24 NOTE — Assessment & Plan Note (Signed)
On Protonix.  Follow.

## 2013-06-27 ENCOUNTER — Encounter: Payer: Self-pay | Admitting: Internal Medicine

## 2013-07-04 ENCOUNTER — Encounter: Payer: Self-pay | Admitting: Internal Medicine

## 2013-07-22 ENCOUNTER — Encounter: Payer: Self-pay | Admitting: Internal Medicine

## 2013-08-06 ENCOUNTER — Encounter: Payer: Self-pay | Admitting: Internal Medicine

## 2013-08-09 NOTE — Telephone Encounter (Signed)
Thursday 08/30/13 @ 11am

## 2013-08-09 NOTE — Telephone Encounter (Signed)
The patient is needing a 2 week follow up . Please advise as to where to put this patient. Thank you.

## 2013-08-10 ENCOUNTER — Encounter: Payer: Self-pay | Admitting: Internal Medicine

## 2013-08-10 MED ORDER — ONDANSETRON 4 MG PO TBDP: 4.0000 mg | ORAL_TABLET | Freq: Two times a day (BID) | ORAL | Status: DC | PRN

## 2013-08-10 NOTE — Telephone Encounter (Signed)
See mychart message, ok to refill?

## 2013-08-10 NOTE — Telephone Encounter (Signed)
Refilled zofran #[id number] with no refills.

## 2013-08-22 ENCOUNTER — Encounter: Payer: Self-pay | Admitting: Internal Medicine

## 2013-08-30 ENCOUNTER — Ambulatory Visit: Payer: 59 | Admitting: Internal Medicine

## 2013-09-10 DIAGNOSIS — Z95810 Presence of automatic (implantable) cardiac defibrillator: Secondary | ICD-10-CM | POA: Insufficient documentation

## 2013-09-21 ENCOUNTER — Ambulatory Visit (INDEPENDENT_AMBULATORY_CARE_PROVIDER_SITE_OTHER): Payer: 59 | Admitting: Internal Medicine

## 2013-09-21 ENCOUNTER — Encounter: Payer: Self-pay | Admitting: Internal Medicine

## 2013-09-21 VITALS — BP 100/70 | HR 81 | Temp 98.1°F | Ht 67.0 in | Wt 134.8 lb

## 2013-09-21 DIAGNOSIS — E039 Hypothyroidism, unspecified: Secondary | ICD-10-CM

## 2013-09-21 DIAGNOSIS — I421 Obstructive hypertrophic cardiomyopathy: Secondary | ICD-10-CM

## 2013-09-21 DIAGNOSIS — K589 Irritable bowel syndrome without diarrhea: Secondary | ICD-10-CM

## 2013-09-21 DIAGNOSIS — G43909 Migraine, unspecified, not intractable, without status migrainosus: Secondary | ICD-10-CM

## 2013-09-21 DIAGNOSIS — I10 Essential (primary) hypertension: Secondary | ICD-10-CM

## 2013-09-21 DIAGNOSIS — D649 Anemia, unspecified: Secondary | ICD-10-CM

## 2013-09-21 DIAGNOSIS — F341 Dysthymic disorder: Secondary | ICD-10-CM

## 2013-09-21 MED ORDER — ONDANSETRON HCL 4 MG PO TABS: 4.0000 mg | ORAL_TABLET | Freq: Two times a day (BID) | ORAL | Status: DC | PRN

## 2013-09-21 NOTE — Progress Notes (Signed)
Pre visit review using our clinic review tool, if applicable. No additional management support is needed unless otherwise documented below in the visit note. 

## 2013-09-21 NOTE — Progress Notes (Signed)
Subjective:    Patient ID: Carol Mahoney, female    DOB: 08-25-1975, 38 y.o.   MRN: [id number]  HPI 38 year old female with past history of hypothyroidism, anxiety/depression, hypertrophic obstructive cardiomyopathy and IBS who comes in today for a scheduled follow up.   She just recently had open heart surgery - myectomy septum and mitral plasty.  Surgery performed by Dr Evelina Dun.  She is on metoprolol.   Sees Dr Mina Marble.  Just evaluated.  Felt to be doing well.  He stopped her triam/hctz.  She is wearing support hose.   Taking furosemide now.   Off aspirin.  She has continued to have intermittent flares with her bowels.  Has had extensive w/up in the past.  Has had EGD x 2 and colonoscopy.  Has been diagnosed with IBS. Has been flaring more recently.   Is s/p Roux-en-Y.  On Protonix.  Saw Dr Yves Dill ([place]).  She has been under an increased amount or stress recently.  When stressed, she has more physical symptoms.  Sees Dr Toy Care.  Most of her stress is related to her daughter issues.  Daughter seeing a counselor and psychiatrist.  Going to cardiac rehab.   Has a history of headaches.  Not frequent now.  Did have ICD placed.  Did well with this procedure.  She does report some increased fatigue.  Not due to see hematology until 10/12/13.      Past Medical History  Diagnosis Date  . Hypertrophic obstructive cardiomyopathy     mitral regurgitation, LAE - followed at [facility]  . Hypertension   . Hypothyroidism   . Anemia   . GERD (gastroesophageal reflux disease)   . H/O hiatal hernia   . Migraine headache   . Anxiety   . Depression   . PONV (postoperative nausea and vomiting)     Current Outpatient Prescriptions on File Prior to Visit  Medication Sig Dispense Refill  . acetaminophen (TYLENOL) 500 MG tablet Take 500 mg by mouth every 6 (six) hours as needed. For headache      . ALPRAZolam (XANAX) 0.5 MG tablet 0.5 mg 3 (three) times daily as needed.       . cyclobenzaprine (FLEXERIL) 10 MG tablet Take  10 mg by mouth 3 (three) times daily as needed for muscle spasms.      . DULoxetine (CYMBALTA) 60 MG capsule Take 60 mg by mouth daily.      Marland Kitchen levothyroxine (SYNTHROID) 100 MCG tablet Take 1 tablet (100 mcg total) by mouth daily before breakfast. Needs name brand synthroid only  90 tablet  3  . metoprolol tartrate (LOPRESSOR) 25 MG tablet Take 12.5 mg by mouth 2 (two) times daily.      . ondansetron (ZOFRAN-ODT) 4 MG disintegrating tablet Take 1 tablet (4 mg total) by mouth 2 (two) times daily as needed.  20 tablet  0  . pantoprazole (PROTONIX) 40 MG tablet Take 1 tablet (40 mg total) by mouth daily.  90 tablet  2  . SUMAtriptan (IMITREX) 50 MG tablet Take 1 tablet (50 mg total) by mouth every 2 (two) hours as needed. For migraines  54 tablet  1  . traZODone (DESYREL) 50 MG tablet 50 mg. Takes 1/2 tablet as needed at bedtime       No current facility-administered medications on file prior to visit.    Review of Systems Increased stress.  Some intermittent increased heart racing.  Feels her breathing is stable.  No nausea or vomiting.  No current abdominal pain or cramping.  Bowels flare intermittently.  Worse with stress.  No urine change.  Seeing Dr Toy Care.  On Cymbalta now and uses Xanax prn and Trazodone to help her sleep.  Has seen Dr Yves Dill for her GI issues.  Seeing Dr Mina Marble.  Is s/p surgery as outlined above.  On low dose metoprolol.  Off triam/hctz now.  Wearing support hose.  Taking lasix.  S/p ICD placement.         Objective:   Physical Exam  Filed Vitals:   09/21/13 0800  BP: 100/70  Pulse: 81  Temp: 98.1 F (36.7 C)   Blood pressure recheck;  17/75  38 year old female in no acute distress.   HEENT:  Nares - clear.  OP- without lesions or erythema.  NECK:  Supple, nontender.  No audible bruit.   HEART:  Appears to be regular.  II/VI systolic murmur.   LUNGS:  Without crackles or wheezing audible.  Respirations even and unlabored.   RADIAL PULSE:  Equal bilaterally.  ABDOMEN:   Soft, nontender.  No audible abdominal bruit.   EXTREMITIES:  No increased edema to be present.  Stable.                    Assessment & Plan:  GI.  Persistent intermittent flares.   Has had EGD x 2 and colonoscopies in the past.  Was diagnosed with IBS.  Has seen Dr Sigurd Sos (GI - [facility]).    GYN.  Is s/p hysterectomy.  Sees GYN.   ELECTROLYTE ABNORMALITY.  Problems with low potassium.  Not on potassium supplements now.  Just checked at [facility].  On lasix now.  Recheck to confirm wnl.    HEALTH MAINTENANCE.  Breasts, pelvic and pap smears are done at [facility].  States she is up to date.  Follow.

## 2013-09-23 ENCOUNTER — Encounter: Payer: Self-pay | Admitting: Internal Medicine

## 2013-09-23 NOTE — Assessment & Plan Note (Signed)
Blood pressure as outlined.  Off triam/hctz.  Taking lasix.   Follow.

## 2013-09-23 NOTE — Assessment & Plan Note (Signed)
S/p surgery as outlined.  On metoprolol.  Seeing Dr Mina Marble.   Is s/p ICD.  Just evaluated.  Felt stable.

## 2013-09-23 NOTE — Assessment & Plan Note (Addendum)
Continues to see psychiatry. Is followed by Dr Toy Care.  Increased stress with her daughter.  Follow.

## 2013-09-23 NOTE — Assessment & Plan Note (Signed)
Has seen Dr Andy Wolff.  Bowels symptoms as outlined.  Worsens with increased stress.  Follow.   

## 2013-09-23 NOTE — Assessment & Plan Note (Signed)
Has had extensive GI w/up.  Is s/p iron infusion.  hgb was normal last check.  Some increased fatigue.  Recheck cbc.

## 2013-09-23 NOTE — Assessment & Plan Note (Signed)
On replacement.  Follow TSH.   

## 2013-09-23 NOTE — Assessment & Plan Note (Signed)
Not as frequent.  Have discussed the need to limit/stop imitrex.  Follow.   

## 2013-10-03 ENCOUNTER — Telehealth: Payer: Self-pay | Admitting: Internal Medicine

## 2013-10-03 ENCOUNTER — Encounter: Payer: Self-pay | Admitting: Internal Medicine

## 2013-10-03 NOTE — Telephone Encounter (Signed)
Pt notified of lab results via my chart.  Needs a f/u liver panel week of 10/01/13.

## 2013-10-05 NOTE — Telephone Encounter (Signed)
Pt was aware of unread message, see other mychart message with response, stating she would pick up lab slip and have labs done today. Called to verify pt had picked up slip, but VM was full, unable to leave message.

## 2013-10-05 NOTE — Telephone Encounter (Signed)
PT came to office and picked up lab slip

## 2013-10-10 ENCOUNTER — Encounter: Payer: Self-pay | Admitting: Internal Medicine

## 2013-10-10 ENCOUNTER — Telehealth: Payer: Self-pay | Admitting: Internal Medicine

## 2013-10-10 DIAGNOSIS — R945 Abnormal results of liver function studies: Secondary | ICD-10-CM

## 2013-10-10 DIAGNOSIS — R7989 Other specified abnormal findings of blood chemistry: Secondary | ICD-10-CM

## 2013-10-10 NOTE — Telephone Encounter (Signed)
Pt notified of lab results via my chart.  To notify me if agreeable to abdominal US

## 2013-10-11 NOTE — Telephone Encounter (Signed)
Order placed for abdominal ultrasound.   

## 2013-10-12 ENCOUNTER — Ambulatory Visit: Payer: Self-pay | Admitting: Oncology

## 2013-10-12 NOTE — Telephone Encounter (Signed)
Unread mychart message mailed to patient 

## 2013-10-19 ENCOUNTER — Encounter: Payer: Self-pay | Admitting: Internal Medicine

## 2013-10-22 ENCOUNTER — Ambulatory Visit: Payer: Self-pay | Admitting: Internal Medicine

## 2013-10-26 ENCOUNTER — Telehealth: Payer: Self-pay | Admitting: Internal Medicine

## 2013-10-26 NOTE — Telephone Encounter (Signed)
Pt left voicemail asking for results. Pt was notified of results & states that she had just read her mychart message too. Pt asked that we mail lab order.

## 2013-10-26 NOTE — Telephone Encounter (Signed)
Pt notified abdominal US results.  Ok.

## 2013-11-15 LAB — ICD DEVICE OBSERVATION

## 2013-11-19 ENCOUNTER — Encounter: Payer: Self-pay | Admitting: Internal Medicine

## 2013-11-19 ENCOUNTER — Other Ambulatory Visit: Payer: Self-pay | Admitting: Internal Medicine

## 2013-11-20 ENCOUNTER — Encounter: Payer: Self-pay | Admitting: Internal Medicine

## 2013-11-28 LAB — LIPID PANEL
Cholesterol: 167 mg/dL (ref 0–200)
HDL: 72 mg/dL — AB (ref 35–70)
LDL CALC: 81 mg/dL
LDl/HDL Ratio: 2.3
Triglycerides: 72 mg/dL (ref 40–160)

## 2013-11-28 LAB — BASIC METABOLIC PANEL
Creatinine: 1 mg/dL (ref 0.5–1.1)
Glucose: 69 mg/dL

## 2013-11-28 LAB — HEMOGLOBIN A1C: HEMOGLOBIN A1C: 5 % (ref 4.0–6.0)

## 2013-11-30 ENCOUNTER — Encounter: Payer: Self-pay | Admitting: Internal Medicine

## 2013-12-04 NOTE — Telephone Encounter (Signed)
Left pt a voicemail on her cell to inform her that labslip has been placed up front for pick up

## 2013-12-05 ENCOUNTER — Other Ambulatory Visit: Payer: Self-pay | Admitting: Internal Medicine

## 2013-12-05 LAB — HEPATIC FUNCTION PANEL
ALK PHOS: 81 U/L (ref 25–125)
ALT: 42 U/L — AB (ref 7–35)
AST: 39 U/L — AB (ref 13–35)
BILIRUBIN DIRECT: 0.18 mg/dL (ref 0.01–0.4)
Bilirubin, Total: 0.5 mg/dL

## 2013-12-11 ENCOUNTER — Encounter: Payer: Self-pay | Admitting: Internal Medicine

## 2014-01-07 LAB — CBC AND DIFFERENTIAL
HEMATOCRIT: 36 % (ref 36–46)
HEMOGLOBIN: 13 g/dL (ref 12.0–16.0)
Neutrophils Absolute: 2 /uL
Platelets: 187 10*3/uL (ref 150–399)
WBC: 4.8 10^3/mL

## 2014-01-11 ENCOUNTER — Ambulatory Visit: Payer: Self-pay | Admitting: Oncology

## 2014-01-22 ENCOUNTER — Ambulatory Visit: Payer: Self-pay | Admitting: Oncology

## 2014-01-22 ENCOUNTER — Ambulatory Visit (INDEPENDENT_AMBULATORY_CARE_PROVIDER_SITE_OTHER): Payer: 59 | Admitting: Internal Medicine

## 2014-01-22 ENCOUNTER — Encounter: Payer: Self-pay | Admitting: Internal Medicine

## 2014-01-22 VITALS — BP 80/42 | HR 71 | Temp 98.1°F | Ht 67.0 in | Wt 132.5 lb

## 2014-01-22 DIAGNOSIS — G43909 Migraine, unspecified, not intractable, without status migrainosus: Secondary | ICD-10-CM

## 2014-01-22 DIAGNOSIS — Z1239 Encounter for other screening for malignant neoplasm of breast: Secondary | ICD-10-CM

## 2014-01-22 DIAGNOSIS — F341 Dysthymic disorder: Secondary | ICD-10-CM

## 2014-01-22 DIAGNOSIS — K589 Irritable bowel syndrome without diarrhea: Secondary | ICD-10-CM

## 2014-01-22 DIAGNOSIS — E039 Hypothyroidism, unspecified: Secondary | ICD-10-CM

## 2014-01-22 DIAGNOSIS — D649 Anemia, unspecified: Secondary | ICD-10-CM

## 2014-01-22 DIAGNOSIS — I10 Essential (primary) hypertension: Secondary | ICD-10-CM

## 2014-01-22 DIAGNOSIS — I421 Obstructive hypertrophic cardiomyopathy: Secondary | ICD-10-CM

## 2014-01-22 DIAGNOSIS — R5381 Other malaise: Secondary | ICD-10-CM

## 2014-01-22 DIAGNOSIS — R5383 Other fatigue: Secondary | ICD-10-CM

## 2014-01-22 NOTE — Progress Notes (Signed)
Subjective:    Patient ID: Carol Mahoney, female    DOB: 1975/08/07, 38 y.o.   MRN: [id number]  HPI 38 year old female with past history of hypothyroidism, anxiety/depression, hypertrophic obstructive cardiomyopathy and IBS who comes in today for a scheduled follow up.   She just recently had open heart surgery - myectomy septum and mitral plasty.  Surgery performed by Dr Evelina Dun.  She is on metoprolol.   Sees Dr Mina Marble.  Felt to be doing well.  He stopped her triam/hctz.  She is wearing support hose.   Taking furosemide now.   Off aspirin.  She has continued to have intermittent flares with her bowels.  Has had extensive w/up in the past.  Has had EGD x 2 and colonoscopy.  Has been diagnosed with IBS. Has been flaring more recently.   Is s/p Roux-en-Y.  On Protonix.  Saw Dr Yves Dill ([place]).  Planning to f/u with a GI physician that her mother knows.  Will get me the information.  She has been under an increased amount or stress recently.  When stressed, she has more physical symptoms.  Sees Dr Toy Care.  Most of her stress is related to her daughter's issues and her work.   Daughter seeing a counselor and psychiatrist.  Is doing better now.  Decreased stress at home.  Did have ICD placed.  Due to f/u in 11/15 for her ICD.  She does report some increased fatigue.  Saw hematology.  hgb stable.  Has been released.      Past Medical History  Diagnosis Date  . Hypertrophic obstructive cardiomyopathy     mitral regurgitation, LAE - followed at [facility]  . Hypertension   . Hypothyroidism   . Anemia   . GERD (gastroesophageal reflux disease)   . H/O hiatal hernia   . Migraine headache   . Anxiety   . Depression   . PONV (postoperative nausea and vomiting)     Current Outpatient Prescriptions on File Prior to Visit  Medication Sig Dispense Refill  . acetaminophen (TYLENOL) 500 MG tablet Take 500 mg by mouth every 6 (six) hours as needed. For headache      . ALPRAZolam (XANAX) 0.5 MG tablet 0.5 mg 3 (three)  times daily as needed.       . cyclobenzaprine (FLEXERIL) 10 MG tablet Take 10 mg by mouth 3 (three) times daily as needed for muscle spasms.      . DULoxetine (CYMBALTA) 60 MG capsule Take 60 mg by mouth daily.      . furosemide (LASIX) 20 MG tablet Take 20 mg by mouth daily.      Marland Kitchen levothyroxine (SYNTHROID) 100 MCG tablet Take 1 tablet (100 mcg total) by mouth daily before breakfast. Needs name brand synthroid only  90 tablet  3  . metoprolol tartrate (LOPRESSOR) 25 MG tablet Take 12.5 mg by mouth 2 (two) times daily.      . ondansetron (ZOFRAN) 4 MG tablet Take 1 tablet (4 mg total) by mouth 2 (two) times daily as needed for nausea or vomiting.  20 tablet  0  . pantoprazole (PROTONIX) 40 MG tablet Take 1 tablet (40 mg total) by mouth daily.  90 tablet  2  . SUMAtriptan (IMITREX) 50 MG tablet Take 1 tablet (50 mg total) by mouth every 2 (two) hours as needed. For migraines  54 tablet  1  . traZODone (DESYREL) 50 MG tablet 50 mg. Takes 1/2 tablet as needed at bedtime  No current facility-administered medications on file prior to visit.    Review of Systems Increased stress.  Feels her breathing is stable.  No nausea or vomiting.  No current abdominal pain or cramping.  Bowels flare intermittently.  Worse with stress.  No urine change.  Seeing Dr Toy Care.  On Cymbalta now and uses Xanax prn and Trazodone to help her sleep.  Has seen Dr Yves Dill for her GI issues.  Seeing Dr Mina Marble.  Is s/p surgery as outlined above.  On low dose metoprolol.  Off triam/hctz now.  Wearing support hose.  Taking lasix.  S/p ICD placement.  Increased stress at work.  Daughter doing better.        Objective:   Physical Exam  Filed Vitals:   01/22/14 0756  BP: 80/42  Pulse: 71  Temp: 98.1 F (36.7 C)   Blood pressure recheck:  55/55  38 year old female in no acute distress.   HEENT:  Nares - clear.  OP- without lesions or erythema.  NECK:  Supple, nontender.  No audible bruit.   HEART:  Appears to be regular.   II/VI systolic murmur.   LUNGS:  Without crackles or wheezing audible.  Respirations even and unlabored.   RADIAL PULSE:  Equal bilaterally.  ABDOMEN:  Soft, nontender.  No audible abdominal bruit.   EXTREMITIES:  No increased edema to be present.  Stable.   DP pulses palpable and equal bilaterally.                  Assessment & Plan:  GI.  Persistent intermittent flares.   Has had EGD x 2 and colonoscopies in the past.  Was diagnosed with IBS.  Has seen Dr Sigurd Sos (GI - [facility]).  Planning to f/u with a GI physician her mother knows.  Will get me the information.   GYN.  Is s/p hysterectomy.  Sees GYN.  Overdue.  Will schedule.    ELECTROLYTE ABNORMALITY.  Previous problems with low potassium.  Not on potassium supplements now.  On lasix now.  Follow potassium to confirm remains wnl.      HEALTH MAINTENANCE.  Breasts, pelvic and pap smears are done at [facility]. .  States she is overdue.  Plans to schedule.  Schedule mammogram.   Follow.    I spent 25 minutes with the patient and more than 50% of the time was spent in consultation regarding the above.

## 2014-01-22 NOTE — Progress Notes (Signed)
Pre visit review using our clinic review tool, if applicable. No additional management support is needed unless otherwise documented below in the visit note. 

## 2014-01-27 ENCOUNTER — Encounter: Payer: Self-pay | Admitting: Internal Medicine

## 2014-01-27 DIAGNOSIS — R5383 Other fatigue: Secondary | ICD-10-CM | POA: Insufficient documentation

## 2014-01-27 NOTE — Assessment & Plan Note (Signed)
Blood pressure as outlined.  Off triam/hctz.  Taking lasix.   Follow. Needs to stay hydrated.

## 2014-01-27 NOTE — Assessment & Plan Note (Signed)
On replacement.  Follow TSH.

## 2014-01-27 NOTE — Assessment & Plan Note (Signed)
Continues to see psychiatry. Is followed by Dr Toy Care.  Increased stress with her daughter and with her work.  Daughter is doing better.  Follow.

## 2014-01-27 NOTE — Assessment & Plan Note (Signed)
Not as frequent.  Have discussed the need to limit/stop imitrex.  Follow.

## 2014-01-27 NOTE — Assessment & Plan Note (Signed)
S/p surgery as outlined.  On metoprolol.  Seeing Dr Mina Marble.   Is s/p ICD.  Felt stable.  Sees her yearly.

## 2014-01-27 NOTE — Assessment & Plan Note (Signed)
Overall stable.  Stress better at home, but still with increased stress at work.  Check hgb and electrolytes.  Heart stable.  Follow.

## 2014-01-27 NOTE — Assessment & Plan Note (Signed)
Has had extensive GI w/up.  Is s/p iron infusion.  Hgb was normal last check.  Some increased fatigue.  Recheck cbc.  Has been released by hematology.

## 2014-01-27 NOTE — Assessment & Plan Note (Signed)
Has seen Dr Sigurd Sos.  Bowels symptoms as outlined.  Worsens with increased stress.  Follow.

## 2014-02-06 ENCOUNTER — Encounter: Payer: Self-pay | Admitting: *Deleted

## 2014-02-06 ENCOUNTER — Ambulatory Visit: Payer: Self-pay | Admitting: Internal Medicine

## 2014-02-06 LAB — HM MAMMOGRAPHY: HM Mammogram: NEGATIVE

## 2014-02-11 ENCOUNTER — Other Ambulatory Visit: Payer: Self-pay | Admitting: Internal Medicine

## 2014-02-23 LAB — HEPATIC FUNCTION PANEL
ALK PHOS: 94 U/L (ref 25–125)
ALT: 31 U/L (ref 7–35)
AST: 31 U/L (ref 13–35)
Bilirubin, Direct: 0.21 mg/dL (ref 0.01–0.4)
Bilirubin, Total: 0.8 mg/dL

## 2014-02-23 LAB — BASIC METABOLIC PANEL
BUN: 15 mg/dL (ref 4–21)
CREATININE: 0.9 mg/dL (ref 0.5–1.1)
Glucose: 110 mg/dL
POTASSIUM: 3.7 mmol/L (ref 3.4–5.3)
Sodium: 141 mmol/L (ref 137–147)

## 2014-02-23 LAB — TSH: TSH: 3.75 u[IU]/mL (ref 0.41–5.90)

## 2014-02-25 ENCOUNTER — Encounter: Payer: Self-pay | Admitting: Internal Medicine

## 2014-03-13 ENCOUNTER — Telehealth: Payer: Self-pay | Admitting: Internal Medicine

## 2014-03-13 NOTE — Telephone Encounter (Signed)
Pt notified of lab results via mychart. 

## 2014-03-15 NOTE — Telephone Encounter (Signed)
Unread mychart message mailed to patient 

## 2014-04-01 DIAGNOSIS — Z4502 Encounter for adjustment and management of automatic implantable cardiac defibrillator: Secondary | ICD-10-CM | POA: Insufficient documentation

## 2014-04-09 ENCOUNTER — Encounter: Payer: Self-pay | Admitting: Internal Medicine

## 2014-04-15 ENCOUNTER — Encounter: Payer: Self-pay | Admitting: Internal Medicine

## 2014-04-16 NOTE — Telephone Encounter (Signed)
FYI from patient

## 2014-04-16 NOTE — Telephone Encounter (Signed)
FYI

## 2014-05-15 ENCOUNTER — Other Ambulatory Visit: Payer: Self-pay | Admitting: Internal Medicine

## 2014-05-21 HISTORY — PX: IMPLANTABLE CARDIOVERTER DEFIBRILLATOR REVISION: SHX5861

## 2014-05-22 DIAGNOSIS — T82198A Other mechanical complication of other cardiac electronic device, initial encounter: Secondary | ICD-10-CM | POA: Insufficient documentation

## 2014-05-27 ENCOUNTER — Encounter: Payer: Self-pay | Admitting: Internal Medicine

## 2014-05-27 ENCOUNTER — Telehealth: Payer: Self-pay | Admitting: *Deleted

## 2014-05-27 ENCOUNTER — Ambulatory Visit (INDEPENDENT_AMBULATORY_CARE_PROVIDER_SITE_OTHER): Payer: 59 | Admitting: Internal Medicine

## 2014-05-27 VITALS — BP 100/60 | HR 81 | Temp 98.2°F | Ht 67.0 in | Wt 137.8 lb

## 2014-05-27 DIAGNOSIS — M7989 Other specified soft tissue disorders: Secondary | ICD-10-CM

## 2014-05-27 DIAGNOSIS — I421 Obstructive hypertrophic cardiomyopathy: Secondary | ICD-10-CM

## 2014-05-27 DIAGNOSIS — I82402 Acute embolism and thrombosis of unspecified deep veins of left lower extremity: Secondary | ICD-10-CM | POA: Insufficient documentation

## 2014-05-27 DIAGNOSIS — F341 Dysthymic disorder: Secondary | ICD-10-CM

## 2014-05-27 DIAGNOSIS — K589 Irritable bowel syndrome without diarrhea: Secondary | ICD-10-CM

## 2014-05-27 DIAGNOSIS — I10 Essential (primary) hypertension: Secondary | ICD-10-CM

## 2014-05-27 MED ORDER — CYCLOBENZAPRINE HCL 10 MG PO TABS: 10.0000 mg | ORAL_TABLET | Freq: Every day | ORAL | Status: DC | PRN

## 2014-05-27 NOTE — Progress Notes (Signed)
Pre visit review using our clinic review tool, if applicable. No additional management support is needed unless otherwise documented below in the visit note. 

## 2014-05-27 NOTE — Progress Notes (Signed)
Subjective:    Patient ID: Carol Mahoney, female    DOB: 09-10-1975, 39 y.o.   MRN: [id number]  HPI 39 year old female with past history of hypothyroidism, anxiety/depression, hypertrophic obstructive cardiomyopathy and IBS who comes in today for a scheduled follow up.   She recently had open heart surger.  Surgery performed by Dr Evelina Dun.  She is on metoprolol.   Sees Dr Mina Marble.  He stopped her triam/hctz.  She is wearing support hose.   Taking furosemide now.   Had ICD placed.  Recently went in to "have a wire changed".  Blood pressure was low after the procedure.  Was given IVFs.  Had stat ECHO.  No acute abnormality found.  Prior to her discharge she was noted to have left arm swelling.  Upper extremity duplex was negative for a clot.  She was discharged with instructions to elevate the arm.  She has been doing this and reports the arm is no better.  Hand is discolored..  No increased pain.  Able to move the arm.  Otherwise feels symptoms are stable.    She has continued to have intermittent flares with her bowels.  Has had extensive w/up in the past.  Has had EGD x 2 and colonoscopy.  Has been diagnosed with IBS. Is s/p Roux-en-Y.  On Protonix.  Saw Dr Yves Dill ([facility]).   She has been under an increased amount or stress recently.  When stressed, she has more physical symptoms.  Sees Dr Toy Care.  Most of her stress is related to her daughter's issues and her work.        Past Medical History  Diagnosis Date  . Hypertrophic obstructive cardiomyopathy     mitral regurgitation, LAE - followed at [facility]  . Hypertension   . Hypothyroidism   . Anemia   . GERD (gastroesophageal reflux disease)   . H/O hiatal hernia   . Migraine headache   . Anxiety   . Depression   . PONV (postoperative nausea and vomiting)     Current Outpatient Prescriptions on File Prior to Visit  Medication Sig Dispense Refill  . acetaminophen (TYLENOL) 500 MG tablet Take 500 mg by mouth every 6 (six) hours as needed. For  headache    . ALPRAZolam (XANAX) 0.5 MG tablet 0.5 mg 3 (three) times daily as needed.     . cyclobenzaprine (FLEXERIL) 10 MG tablet Take 10 mg by mouth 3 (three) times daily as needed for muscle spasms.    . DULoxetine (CYMBALTA) 60 MG capsule Take 60 mg by mouth daily.    . furosemide (LASIX) 20 MG tablet Take 20 mg by mouth daily.    . metoprolol tartrate (LOPRESSOR) 25 MG tablet Take 12.5 mg by mouth 2 (two) times daily.    . ondansetron (ZOFRAN) 4 MG tablet Take 1 tablet (4 mg total) by mouth 2 (two) times daily as needed for nausea or vomiting. 20 tablet 0  . pantoprazole (PROTONIX) 40 MG tablet Take 1 tablet (40 mg total) by mouth daily. 90 tablet 2  . SUMAtriptan (IMITREX) 50 MG tablet Take 1 tablet (50 mg total) by mouth every 2 (two)  hours as needed. For  migraines 54 tablet 5  . SYNTHROID 100 MCG tablet Take 1 tablet by mouth  daily before breakfast 90 tablet 2  . traZODone (DESYREL) 50 MG tablet 50 mg. Takes 1/2 tablet as needed at bedtime     No current facility-administered medications on file prior to visit.  Review of Systems Feels her breathing is stable.  No nausea or vomiting.  No current abdominal pain or cramping.  Bowels flare intermittently.  Worse with stress.  No urine change.  Seeing Dr Toy Care.  On Cymbalta now and uses Xanax prn and Trazodone to help her sleep.  Has seen Dr Yves Dill for her GI issues.  Seeing Dr Mina Marble.  Is s/p surgery as outlined above.  On low dose metoprolol.  Off triam/hctz now.  Wearing support hose.  Taking lasix.  S/p ICD placement.  Had a procedure last week.  States had "wire changed".  Now with left arm swelling as outlined.  Has kept elevated.  No change.  Hand discolored.       Objective:   Physical Exam  Filed Vitals:   05/27/14 0703  BP: 100/60  Pulse: 81  Temp: 98.2 F (36.8 C)   Blood pressure recheck:  47/69  39 year old female in no acute distress.   HEENT:  OP- without lesions or erythema.  NECK:  Supple, nontender.  No  audible bruit.   HEART:  Appears to be regular.  II/VI systolic murmur.   LUNGS:  Without crackles or wheezing audible.  Respirations even and unlabored.   RADIAL PULSE:  Appears to be equal bilaterally.   ABDOMEN:  Soft, nontender.  No audible abdominal bruit.   EXTREMITIES:  No increased lower extremity edema present.  Stable. Left upper extremity - swollen.  Non tender to palpation.  Palpable radial pulse on left that appears to be equal to right.                 Assessment & Plan:  Essential hypertension Blood pressure runs low now.  On lasix.  Unable to be off diuretics.  Followed by Dr Mina Marble.    Hypertrophic obstructive cardiomyopathy S/p surgery.  Continue f/u with cardiology.  Stable.    Left arm swelling Acute swelling after procedure last week.  Venous duplex negative.  Called cardiology.  Question of need for venogram.  Does have palpable pulse.  Discussed with cardiology at [facility].  They request pt come to the ER for further evaluation and treatment.    IBS Stable.    ANXIETY DEPRESSION Continues to see psychiatry.  Followed by Dr Toy Care.    GI.  Persistent intermittent flares.   Has had EGD x 2 and colonoscopies in the past.  Was diagnosed with IBS.  Has seen Dr Sigurd Sos (GI - [facility]).   GYN.  Is s/p hysterectomy.  Sees GYN.      ELECTROLYTE ABNORMALITY.  Previous problems with low potassium.  Not on potassium supplements now.  On lasix now.  Follow potassium to confirm remains wnl.      HEALTH MAINTENANCE.  Breasts, pelvic and pap smears are done at [facility]. .  Mammogram 02/06/14 - Birads I.      I spent 25 minutes with the patient and more than 50% of the time was spent in consultation regarding the above.

## 2014-05-27 NOTE — Telephone Encounter (Signed)
Pt called to give an update after she was seen in the office this morning & instructed to go to the ER due to arm & hand swelling post-op surgery (defib revision) on 05/11/14.  She went to [facility] & was admitted for clots in the subclavian vein & internal jugular. They are going to start her on Heprin IV & she will go home with a blood thinner & was informed that she will be on it for one year.

## 2014-07-08 ENCOUNTER — Other Ambulatory Visit: Payer: Self-pay | Admitting: Internal Medicine

## 2014-07-11 ENCOUNTER — Ambulatory Visit (INDEPENDENT_AMBULATORY_CARE_PROVIDER_SITE_OTHER): Payer: 59 | Admitting: Internal Medicine

## 2014-07-11 ENCOUNTER — Encounter: Payer: Self-pay | Admitting: Internal Medicine

## 2014-07-11 VITALS — BP 100/60 | HR 66 | Temp 98.1°F | Ht 67.0 in | Wt 135.4 lb

## 2014-07-11 DIAGNOSIS — G43809 Other migraine, not intractable, without status migrainosus: Secondary | ICD-10-CM

## 2014-07-11 DIAGNOSIS — F341 Dysthymic disorder: Secondary | ICD-10-CM

## 2014-07-11 DIAGNOSIS — Z0000 Encounter for general adult medical examination without abnormal findings: Secondary | ICD-10-CM

## 2014-07-11 DIAGNOSIS — D649 Anemia, unspecified: Secondary | ICD-10-CM

## 2014-07-11 DIAGNOSIS — K589 Irritable bowel syndrome without diarrhea: Secondary | ICD-10-CM

## 2014-07-11 DIAGNOSIS — E039 Hypothyroidism, unspecified: Secondary | ICD-10-CM

## 2014-07-11 DIAGNOSIS — I10 Essential (primary) hypertension: Secondary | ICD-10-CM

## 2014-07-11 DIAGNOSIS — I8290 Acute embolism and thrombosis of unspecified vein: Secondary | ICD-10-CM

## 2014-07-11 DIAGNOSIS — I421 Obstructive hypertrophic cardiomyopathy: Secondary | ICD-10-CM

## 2014-07-11 NOTE — Progress Notes (Signed)
Pre visit review using our clinic review tool, if applicable. No additional management support is needed unless otherwise documented below in the visit note. 

## 2014-07-14 ENCOUNTER — Encounter: Payer: Self-pay | Admitting: Internal Medicine

## 2014-07-14 DIAGNOSIS — Z0000 Encounter for general adult medical examination without abnormal findings: Secondary | ICD-10-CM | POA: Insufficient documentation

## 2014-07-14 DIAGNOSIS — I8290 Acute embolism and thrombosis of unspecified vein: Secondary | ICD-10-CM | POA: Insufficient documentation

## 2014-07-14 NOTE — Progress Notes (Signed)
Patient ID: Carol Mahoney, female   DOB: 1975/08/02, 39 y.o.   MRN: [id number]   Subjective:    Patient ID: Carol Mahoney, female    DOB: Oct 02, 1975, 39 y.o.   MRN: [id number]  HPI  Patient here for a scheduled follow up.  Recently found to have an occlusive thrombus in her internal jugular.  On eloquis.  Arm is better.  Has decreased in size.  Color better.  Feels better.  Increased stress.  Tearful intermittently.  Sees Dr Toy Care.  Discussed seeing a counselor.  No cardiac symptoms with increased activity or exertion.  Breathing stable.  Bowels still flare.     Past Medical History  Diagnosis Date  . Hypertrophic obstructive cardiomyopathy     mitral regurgitation, LAE - followed at [facility]  . Hypertension   . Hypothyroidism   . Anemia   . GERD (gastroesophageal reflux disease)   . H/O hiatal hernia   . Migraine headache   . Anxiety   . Depression   . PONV (postoperative nausea and vomiting)     Current Outpatient Prescriptions on File Prior to Visit  Medication Sig Dispense Refill  . acetaminophen (TYLENOL) 500 MG tablet Take 500 mg by mouth every 6 (six) hours as needed. For headache    . ALPRAZolam (XANAX) 0.5 MG tablet 0.5 mg 3 (three) times daily as needed.     . cyclobenzaprine (FLEXERIL) 10 MG tablet Take 1 tablet (10 mg total) by mouth daily as needed for muscle spasms. 30 tablet 0  . DULoxetine (CYMBALTA) 60 MG capsule Take 60 mg by mouth daily.    . furosemide (LASIX) 20 MG tablet Take 20 mg by mouth daily.    . metoprolol tartrate (LOPRESSOR) 25 MG tablet Take 12.5 mg by mouth 2 (two) times daily.    . ondansetron (ZOFRAN) 4 MG tablet Take 1 tablet (4 mg total) by mouth 2 (two) times daily as needed for nausea or vomiting. 20 tablet 0  . oxyCODONE-acetaminophen (PERCOCET/ROXICET) 5-325 MG per tablet as needed.    . pantoprazole (PROTONIX) 40 MG tablet Take 1 tablet by mouth  daily 90 tablet 2  . SUMAtriptan (IMITREX) 50 MG tablet Take 1 tablet (50 mg total) by mouth every 2  (two)  hours as needed. For  migraines 54 tablet 5  . SYNTHROID 100 MCG tablet Take 1 tablet by mouth  daily before breakfast 90 tablet 2  . traZODone (DESYREL) 50 MG tablet 50 mg. Takes 1/2 tablet as needed at bedtime     No current facility-administered medications on file prior to visit.    Review of Systems  Constitutional: Positive for fatigue (some fatigue with the increased stress.  ). Negative for unexpected weight change.  HENT: Negative for congestion and sinus pressure.   Respiratory: Negative for cough, chest tightness and shortness of breath.   Cardiovascular: Negative for chest pain, palpitations and leg swelling.  Gastrointestinal: Positive for diarrhea (intermittent flares of her bowels.  ). Negative for nausea and vomiting.  Genitourinary: Negative for dysuria and difficulty urinating.  Musculoskeletal: Negative for joint swelling and neck pain.  Skin: Negative for color change and rash.  Neurological: Negative for dizziness, light-headedness and headaches.  Psychiatric/Behavioral:       Increased stress.  Seeing psychiatry.  Discussed counseling.  She is in agreement.         Objective:    Physical Exam  Constitutional: She appears well-developed and well-nourished. No distress.  HENT:  Nose: Nose normal.  Mouth/Throat: Oropharynx is clear and moist.  Neck: Neck supple. No thyromegaly present.  Cardiovascular: Normal rate and regular rhythm.   Pulmonary/Chest: Breath sounds normal. No respiratory distress. She has no wheezes.  Abdominal: Soft. Bowel sounds are normal. There is no tenderness.  Musculoskeletal: She exhibits no edema or tenderness.  Lymphadenopathy:    She has no cervical adenopathy.  Skin: No rash noted. No erythema.    BP 100/60 mmHg  Pulse 66  Temp(Src) 98.1 F (36.7 C) (Oral)  Ht 5\' 7"  (1.702 m)  Wt 135 lb 6 oz (61.406 kg)  BMI 21.20 kg/m2  SpO2 99%  LMP 10/08/2011 Wt Readings from Last 3 Encounters:  07/11/14 135 lb 6 oz (61.406  kg)  05/27/14 137 lb 12 oz (62.483 kg)  01/22/14 132 lb 8 oz (60.102 kg)     Lab Results  Component Value Date   WBC 4.8 01/07/2014   HGB 13.0 01/07/2014   HCT 36 01/07/2014   PLT 187 01/07/2014   GLUCOSE 98 10/06/2012   CHOL 167 11/28/2013   TRIG 72 11/28/2013   HDL 72* 11/28/2013   LDLCALC 81 11/28/2013   ALT 31 02/23/2014   AST 31 02/23/2014   NA 141 02/23/2014   K 3.7 02/23/2014   CL 96* 10/06/2012   CREATININE 0.9 02/23/2014   BUN 15 02/23/2014   CO2 27 10/06/2012   TSH 3.75 02/23/2014   HGBA1C 5.0 11/28/2013       Assessment & Plan:   Problem List Items Addressed This Visit    Anemia    Has had extensive GI w/up.  Is s/p iron infusion.  Last hgb check was normal.  Follow.        ANXIETY DEPRESSION    Sees psychiatry.  Followed by Dr Toy Care.  Increased stress recently.  We discussed counseling.  She is in agreement.  Gave her names of two counselors.  Follow.        Essential hypertension - Primary    Blood pressure doing well.  Taking lasix most days.  Follow.        Relevant Medications   ELIQUIS 5 MG TABS tablet   Health care maintenance    Mammogram 01/27/14 - Birads I.   Breast, pelvic and pap smear - by gyn.        Hypertrophic obstructive cardiomyopathy    S/p surgery.  On metoprolol.  Seeing Dr Mina Marble.  Is s/p ICD.  Stable.        Relevant Medications   ELIQUIS 5 MG TABS tablet   Hypothyroidism    On thyroid replacement.  Follow tsh.        IBS    Still with flares.  Follow.        Migraine    Stable.  Follow.  Not as frequent.        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  Also has nonocclusive thrombus surrounding the pacemaker leads in the left subclavian and left brachiocephalic veins into the left axillary vein.  Occurred s/p intervention.  On eloquis.  Continues to f/u with cardiology.  Saw vascular surgery and they felt no  surgical intervention warranted.  Will have hematology evaluate regarding length of time for blood thinner and to confirm no other reason for the clotting.        Relevant Medications   ELIQUIS 5 MG TABS tablet  Other Relevant Orders   Ambulatory referral to Hematology     I spent 25 minutes with the patient and more than 50% of the time was spent in consultation regarding the above.     Einar Pheasant, MD

## 2014-07-14 NOTE — Assessment & Plan Note (Signed)
Still with flares.  Follow.

## 2014-07-14 NOTE — Assessment & Plan Note (Signed)
S/p surgery.  On metoprolol.  Seeing Dr Mina Marble.  Is s/p ICD.  Stable.

## 2014-07-14 NOTE — Assessment & Plan Note (Signed)
Has had extensive GI w/up.  Is s/p iron infusion.  Last hgb check was normal.  Follow.

## 2014-07-14 NOTE — Assessment & Plan Note (Signed)
On thyroid replacement.  Follow tsh.  

## 2014-07-14 NOTE — Assessment & Plan Note (Signed)
Mammogram 01/27/14 - Birads I.   Breast, pelvic and pap smear - by gyn.

## 2014-07-14 NOTE — Assessment & Plan Note (Signed)
Blood pressure doing well.  Taking lasix most days.  Follow.

## 2014-07-14 NOTE — Assessment & Plan Note (Signed)
Stable.  Follow.  Not as frequent.

## 2014-07-14 NOTE — Assessment & Plan Note (Signed)
Sees psychiatry.  Followed by Dr Toy Care.  Increased stress recently.  We discussed counseling.  She is in agreement.  Gave her names of two counselors.  Follow.

## 2014-07-14 NOTE — Assessment & Plan Note (Signed)
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  Also has nonocclusive thrombus surrounding the pacemaker leads in the left subclavian and left brachiocephalic veins into the left axillary vein.  Occurred s/p intervention.  On eloquis.  Continues to f/u with cardiology.  Saw vascular surgery and they felt no surgical intervention warranted.  Will have hematology evaluate regarding length of time for blood thinner and to confirm no other reason for the clotting.

## 2014-07-19 ENCOUNTER — Telehealth: Payer: Self-pay | Admitting: *Deleted

## 2014-07-19 NOTE — Telephone Encounter (Signed)
Spoke with pt, she states recently she has been taking more, in the last month she has taken 4 Imitrex due to increased headaches.  Prior to that she states she had taken maybe 1 in three months.

## 2014-07-19 NOTE — Telephone Encounter (Signed)
Since having increased headaches, I would like to refer her to neurology.  Maybe something else we could be using with less side effects and less interactions.  If agreeable, let me know and I will place the order for the referral

## 2014-07-19 NOTE — Telephone Encounter (Signed)
Spoke with pt, she is agreeable to the referral.

## 2014-07-21 ENCOUNTER — Other Ambulatory Visit: Payer: Self-pay | Admitting: Internal Medicine

## 2014-07-21 DIAGNOSIS — G43809 Other migraine, not intractable, without status migrainosus: Secondary | ICD-10-CM

## 2014-07-21 NOTE — Telephone Encounter (Signed)
Order placed for referral to neurology 

## 2014-07-21 NOTE — Progress Notes (Signed)
Order placed for referral to neurology 

## 2014-08-05 ENCOUNTER — Ambulatory Visit
Admit: 2014-08-05 | Disposition: A | Discharge status: Home / Self Care | Payer: Self-pay | Attending: Oncology | Admitting: Oncology

## 2014-08-21 ENCOUNTER — Encounter: Payer: Self-pay | Admitting: Internal Medicine

## 2014-08-23 ENCOUNTER — Ambulatory Visit
Admit: 2014-08-23 | Disposition: A | Discharge status: Home / Self Care | Payer: Self-pay | Attending: Oncology | Admitting: Oncology

## 2014-09-15 ENCOUNTER — Encounter: Payer: Self-pay | Admitting: Internal Medicine

## 2014-09-25 ENCOUNTER — Ambulatory Visit (INDEPENDENT_AMBULATORY_CARE_PROVIDER_SITE_OTHER): Payer: 59 | Admitting: Internal Medicine

## 2014-09-25 ENCOUNTER — Encounter: Payer: Self-pay | Admitting: Internal Medicine

## 2014-09-25 VITALS — BP 90/60 | HR 74 | Temp 97.9°F | Ht 67.0 in | Wt 137.2 lb

## 2014-09-25 DIAGNOSIS — I8290 Acute embolism and thrombosis of unspecified vein: Secondary | ICD-10-CM | POA: Diagnosis not present

## 2014-09-25 DIAGNOSIS — I421 Obstructive hypertrophic cardiomyopathy: Secondary | ICD-10-CM | POA: Diagnosis not present

## 2014-09-25 DIAGNOSIS — T148 Other injury of unspecified body region: Secondary | ICD-10-CM

## 2014-09-25 DIAGNOSIS — T148XXA Other injury of unspecified body region, initial encounter: Secondary | ICD-10-CM

## 2014-09-25 NOTE — Progress Notes (Signed)
Patient ID: Carol Mahoney, female   DOB: 05/10/1976, 39 y.o.   MRN: [id number]   Subjective:    Patient ID: Carol Mahoney, female    DOB: Mar 04, 1976, 39 y.o.   MRN: [id number]  HPI  Patient here as a work in with concerns regarding "knot" on her arm. Has a history of DVT - affecting left arm.  On eliquis.  Arm better.  Noticed increased bruising.  Has two areas - one on each arm - increased bruising and a knot in the center of the one on her right arm.  Minimal tenderness.  No increased redness or warmth.  Has started exercising.  Metoprolol recently increased.  Some fatigue related to this.  Feels breathing is better.  Trying to only take the lasix q 2-3 days.     Past Medical History  Diagnosis Date  . Hypertrophic obstructive cardiomyopathy     mitral regurgitation, LAE - followed at [facility]  . Hypertension   . Hypothyroidism   . Anemia   . GERD (gastroesophageal reflux disease)   . H/O hiatal hernia   . Migraine headache   . Anxiety   . Depression   . PONV (postoperative nausea and vomiting)     Current Outpatient Prescriptions on File Prior to Visit  Medication Sig Dispense Refill  . acetaminophen (TYLENOL) 500 MG tablet Take 500 mg by mouth every 6 (six) hours as needed. For headache    . ALPRAZolam (XANAX) 0.5 MG tablet 0.5 mg 3 (three) times daily as needed.     . cyclobenzaprine (FLEXERIL) 10 MG tablet Take 1 tablet (10 mg total) by mouth daily as needed for muscle spasms. 30 tablet 0  . ELIQUIS 5 MG TABS tablet Take 5 mg by mouth 2 (two) times daily.    . furosemide (LASIX) 20 MG tablet Take 20 mg by mouth daily as needed.     . metoprolol tartrate (LOPRESSOR) 25 MG tablet Take 25 mg by mouth 2 (two) times daily.     . ondansetron (ZOFRAN) 4 MG tablet Take 1 tablet (4 mg total) by mouth 2 (two) times daily as needed for nausea or vomiting. 20 tablet 0  . oxyCODONE-acetaminophen (PERCOCET/ROXICET) 5-325 MG per tablet as needed.    . pantoprazole (PROTONIX) 40 MG tablet Take 1  tablet by mouth  daily 90 tablet 2  . SUMAtriptan (IMITREX) 50 MG tablet Take 1 tablet (50 mg total) by mouth every 2 (two)  hours as needed. For  migraines 54 tablet 5  . SYNTHROID 100 MCG tablet Take 1 tablet by mouth  daily before breakfast 90 tablet 2  . traZODone (DESYREL) 50 MG tablet 50 mg. Takes 1/2 tablet as needed at bedtime     No current facility-administered medications on file prior to visit.    Review of Systems  Constitutional: Positive for fatigue. Negative for unexpected weight change.  HENT: Negative for congestion and sinus pressure.   Respiratory: Negative for cough, chest tightness and shortness of breath (breathing better with increased metoprolol. ).   Cardiovascular: Negative for chest pain and palpitations.  Gastrointestinal: Negative for nausea and vomiting.       Bowels same.  Still with issues.    Neurological: Negative for dizziness, light-headedness and headaches.  Hematological: Negative for adenopathy. Bruises/bleeds easily.       Objective:    Physical Exam  Constitutional: No distress.  HENT:  Nose: Nose normal.  Mouth/Throat: Oropharynx is clear and moist.  Neck: Neck supple. No thyromegaly  present.  Cardiovascular: Normal rate and regular rhythm.   Pulmonary/Chest: Breath sounds normal. No respiratory distress. She has no wheezes.  Abdominal: Soft. Bowel sounds are normal. There is no tenderness.  Musculoskeletal: She exhibits no edema or tenderness.  Lymphadenopathy:    She has no cervical adenopathy.  Skin:  Increased bruising over there upper and lower extremities.  Small firm palpable nodule - center of hematoma right arm.  No increased erythema or warmth.     BP 90/60 mmHg  Pulse 74  Temp(Src) 97.9 F (36.6 C) (Oral)  Ht 5\' 7"  (1.702 m)  Wt 137 lb 4 oz (62.256 kg)  BMI 21.49 kg/m2  SpO2 100%  LMP 10/08/2011 Wt Readings from Last 3 Encounters:  09/25/14 137 lb 4 oz (62.256 kg)  07/11/14 135 lb 6 oz (61.406 kg)  05/27/14 137 lb  12 oz (62.483 kg)     Lab Results  Component Value Date   WBC 5.5 09/26/2014   HGB 12.4 09/26/2014   HCT 36 09/26/2014   PLT 226 09/26/2014   GLUCOSE 98 10/06/2012   CHOL 167 11/28/2013   TRIG 72 11/28/2013   HDL 72* 11/28/2013   LDLCALC 81 11/28/2013   ALT 51* 09/26/2014   AST 44* 09/26/2014   NA 141 09/26/2014   K 4.1 09/26/2014   CL 96* 10/06/2012   CREATININE 0.8 09/26/2014   BUN 13 09/26/2014   CO2 27 10/06/2012   TSH 3.75 02/23/2014   INR 1.0 09/26/2014   HGBA1C 5.0 11/28/2013       Assessment & Plan:   Problem List Items Addressed This Visit    Bruising    Multiple bruises as outlined.  On eliquis.  No evidence of infection.  Check cbc, pt/ptt and liver panel.  Follow.        Hypertrophic obstructive cardiomyopathy - Primary    Followed by cardiology.  Sees Dr Mina Marble.  Recently had metoprolol increased.  Also trying to cut back on lasix.  Stable.  Follow.        Occlusive thrombus    Occlusive thrombus left internal jugular.  Non occlusive thrombus surrounding the pacemaker leads in the left subclavian and left brachiocehalic veins in to the left axillary vein.  On eliquis.  Hematology evaluated.            Einar Pheasant, MD

## 2014-09-25 NOTE — Progress Notes (Signed)
Pre visit review using our clinic review tool, if applicable. No additional management support is needed unless otherwise documented below in the visit note. 

## 2014-09-26 LAB — HEPATIC FUNCTION PANEL
ALT: 51 U/L — AB (ref 7–35)
AST: 44 U/L — AB (ref 13–35)
Alkaline Phosphatase: 98 U/L (ref 25–125)
BILIRUBIN DIRECT: 0.16 mg/dL (ref 0.01–0.4)
Bilirubin, Total: 0.5 mg/dL

## 2014-09-26 LAB — PROTIME-INR: Protime: 10.6 seconds (ref 10.0–13.8)

## 2014-09-26 LAB — BASIC METABOLIC PANEL
BUN: 13 mg/dL (ref 4–21)
Creatinine: 0.8 mg/dL (ref 0.5–1.1)
GLUCOSE: 68 mg/dL
POTASSIUM: 4.1 mmol/L (ref 3.4–5.3)
SODIUM: 141 mmol/L (ref 137–147)

## 2014-09-26 LAB — CBC AND DIFFERENTIAL
HEMATOCRIT: 36 % (ref 36–46)
HEMOGLOBIN: 12.4 g/dL (ref 12.0–16.0)
NEUTROS ABS: 3 /uL
Platelets: 226 10*3/uL (ref 150–399)
WBC: 5.5 10^3/mL

## 2014-09-26 LAB — POCT INR: INR: 1 (ref 0.9–1.1)

## 2014-09-27 ENCOUNTER — Encounter: Payer: Self-pay | Admitting: Internal Medicine

## 2014-09-29 ENCOUNTER — Encounter: Payer: Self-pay | Admitting: Internal Medicine

## 2014-09-29 DIAGNOSIS — T148XXA Other injury of unspecified body region, initial encounter: Secondary | ICD-10-CM | POA: Insufficient documentation

## 2014-09-29 NOTE — Assessment & Plan Note (Signed)
Followed by cardiology.  Sees Dr Mina Marble.  Recently had metoprolol increased.  Also trying to cut back on lasix.  Stable.  Follow.

## 2014-09-29 NOTE — Assessment & Plan Note (Signed)
Multiple bruises as outlined.  On eliquis.  No evidence of infection.  Check cbc, pt/ptt and liver panel.  Follow.

## 2014-09-29 NOTE — Assessment & Plan Note (Signed)
Occlusive thrombus left internal jugular.  Non occlusive thrombus surrounding the pacemaker leads in the left subclavian and left brachiocehalic veins in to the left axillary vein.  On eliquis.  Hematology evaluated.

## 2014-10-09 ENCOUNTER — Ambulatory Visit: Payer: 59 | Admitting: Internal Medicine

## 2014-10-21 ENCOUNTER — Telehealth: Payer: Self-pay | Admitting: Internal Medicine

## 2014-10-21 NOTE — Telephone Encounter (Signed)
Pt notified of lab results via my chart.  Recheck liver panel in 1-2 weeks.

## 2014-10-23 NOTE — Telephone Encounter (Signed)
Unread mychart message & [facility] slip mailed to patient

## 2014-11-04 ENCOUNTER — Encounter: Payer: Self-pay | Admitting: Internal Medicine

## 2014-11-16 LAB — HEPATIC FUNCTION PANEL
ALK PHOS: 71 U/L (ref 25–125)
ALT: 29 U/L (ref 7–35)
AST: 39 U/L — AB (ref 13–35)
Bilirubin, Direct: 0.14 mg/dL (ref 0.01–0.4)
Bilirubin, Total: 0.5 mg/dL

## 2014-11-19 ENCOUNTER — Encounter: Payer: Self-pay | Admitting: Internal Medicine

## 2014-11-19 NOTE — Telephone Encounter (Signed)
Pt notified of normal liver function tests via my chart.

## 2014-11-21 NOTE — Telephone Encounter (Signed)
Unread mychart message mailed to patient 

## 2014-12-12 LAB — LIPID PANEL
CHOLESTEROL: 161 mg/dL (ref 0–200)
HDL: 75 mg/dL — AB (ref 35–70)
LDL CALC: 77 mg/dL
TRIGLYCERIDES: 44 mg/dL (ref 40–160)

## 2014-12-12 LAB — HEMOGLOBIN A1C: Hgb A1c MFr Bld: 4.9 % (ref 4.0–6.0)

## 2014-12-12 LAB — BASIC METABOLIC PANEL: Glucose: 33 mg/dL

## 2014-12-17 ENCOUNTER — Encounter: Payer: Self-pay | Admitting: Internal Medicine

## 2014-12-17 DIAGNOSIS — T7840XA Allergy, unspecified, initial encounter: Secondary | ICD-10-CM

## 2014-12-18 NOTE — Telephone Encounter (Signed)
Order placed for referral.  

## 2015-01-17 ENCOUNTER — Encounter: Payer: Self-pay | Admitting: Internal Medicine

## 2015-01-17 ENCOUNTER — Ambulatory Visit (INDEPENDENT_AMBULATORY_CARE_PROVIDER_SITE_OTHER): Payer: 59 | Admitting: Internal Medicine

## 2015-01-17 ENCOUNTER — Telehealth: Payer: Self-pay | Admitting: *Deleted

## 2015-01-17 VITALS — BP 110/60 | HR 75 | Temp 98.3°F | Ht 67.0 in | Wt 148.1 lb

## 2015-01-17 DIAGNOSIS — I421 Obstructive hypertrophic cardiomyopathy: Secondary | ICD-10-CM | POA: Diagnosis not present

## 2015-01-17 DIAGNOSIS — D649 Anemia, unspecified: Secondary | ICD-10-CM

## 2015-01-17 DIAGNOSIS — I10 Essential (primary) hypertension: Secondary | ICD-10-CM

## 2015-01-17 DIAGNOSIS — F341 Dysthymic disorder: Secondary | ICD-10-CM

## 2015-01-17 DIAGNOSIS — I8290 Acute embolism and thrombosis of unspecified vein: Secondary | ICD-10-CM

## 2015-01-17 DIAGNOSIS — Z1239 Encounter for other screening for malignant neoplasm of breast: Secondary | ICD-10-CM

## 2015-01-17 DIAGNOSIS — E039 Hypothyroidism, unspecified: Secondary | ICD-10-CM

## 2015-01-17 DIAGNOSIS — K589 Irritable bowel syndrome without diarrhea: Secondary | ICD-10-CM

## 2015-01-17 NOTE — Progress Notes (Signed)
Patient ID: Carol Mahoney, female   DOB: 04-Nov-1975, 39 y.o.   MRN: [id number]   Subjective:    Patient ID: Carol Mahoney, female    DOB: 02-04-1976, 39 y.o.   MRN: [id number]  HPI  Patient here for a scheduled follow up.  She is concerned regarding her persistent weight gain.  She was started on a new medication by psychiatry.  States discussed if the medication could be contributing to weight gain.  Psychiatry does not think coming from medication.  Just saw cardiology.  Felt stable.  No increased fluid.  Breathing stable.  Eating and drinking well.  Bowels unchanged.  She is concerned that her left arm remains larger than her right.  No pain.  Is better.  Color better.  Handling stress well.  Does not feel needs intervention at this time.     Past Medical History  Diagnosis Date  . Hypertrophic obstructive cardiomyopathy     mitral regurgitation, LAE - followed at [facility]  . Hypertension   . Hypothyroidism   . Anemia   . GERD (gastroesophageal reflux disease)   . H/O hiatal hernia   . Migraine headache   . Anxiety   . Depression   . PONV (postoperative nausea and vomiting)    Past Surgical History  Procedure Laterality Date  . Tonsillectomy    . Bunionectomy    . Partial gastrectomy    . Hiatal hernia repair    . Cholecystectomy    . Implantable cardioverter defibrillator revision  05/21/14    [facility]   Family History  Problem Relation Age of Onset  . Anesthesia problems Father   . Hypertension Mother   . Hashimoto's thyroiditis Maternal Grandmother   . Hypertension Maternal Grandmother   . Breast cancer Neg Hx   . Colon cancer Neg Hx    Social History   Social History  . Marital Status: Single    Spouse Name: N/A  . Number of Children: 1  . Years of Education: N/A   Occupational History  . [employer]   Social History Main Topics  . Smoking status: Never Smoker   . Smokeless tobacco: Never Used  . Alcohol Use: No  . Drug Use: No  . Sexual Activity:  Not Currently   Other Topics Concern  . None   Social History Narrative    Outpatient Encounter Prescriptions as of 01/17/2015  Medication Sig  . acetaminophen (TYLENOL) 500 MG tablet Take 500 mg by mouth every 6 (six) hours as needed. For headache  . ALPRAZolam (XANAX) 0.5 MG tablet 0.5 mg 3 (three) times daily as needed.   . cyclobenzaprine (FLEXERIL) 10 MG tablet Take 1 tablet (10 mg total) by mouth daily as needed for muscle spasms.  . furosemide (LASIX) 20 MG tablet Take 20 mg by mouth daily as needed.   . metoprolol tartrate (LOPRESSOR) 25 MG tablet Take 25 mg by mouth 2 (two) times daily.   . ondansetron (ZOFRAN) 4 MG tablet Take 1 tablet (4 mg total) by mouth 2 (two) times daily as needed for nausea or vomiting.  Marland Kitchen oxyCODONE-acetaminophen (PERCOCET/ROXICET) 5-325 MG per tablet as needed.  . pantoprazole (PROTONIX) 40 MG tablet Take 1 tablet by mouth  daily  . SUMAtriptan (IMITREX) 50 MG tablet Take 1 tablet (50 mg total) by mouth every 2 (two)  hours as needed. For  migraines  . SYNTHROID 100 MCG tablet Take 1 tablet by mouth  daily before breakfast  . traZODone (DESYREL)  50 MG tablet 50 mg. Takes 1/2 tablet as needed at bedtime  . TRINTELLIX 20 MG TABS Take 20 mg by mouth daily.  . [DISCONTINUED] DULoxetine (CYMBALTA) 30 MG capsule Take 30 mg by mouth daily.  . [DISCONTINUED] ELIQUIS 5 MG TABS tablet Take 5 mg by mouth 2 (two) times daily.   No facility-administered encounter medications on file as of 01/17/2015.    Review of Systems  Constitutional: Negative for appetite change.       Increased weight as outlined.    HENT: Negative for congestion and sinus pressure.   Respiratory: Negative for cough, chest tightness and shortness of breath.   Cardiovascular: Negative for chest pain, palpitations and leg swelling.  Gastrointestinal: Negative for nausea and vomiting.       Bowels stable.    Genitourinary: Negative for dysuria and difficulty urinating.  Musculoskeletal:  Negative for back pain and joint swelling.  Skin: Negative for color change and rash.  Neurological: Negative for dizziness, light-headedness and headaches.  Hematological: Negative for adenopathy. Does not bruise/bleed easily.  Psychiatric/Behavioral: Negative for dysphoric mood and agitation.       Objective:    Physical Exam  Constitutional: She appears well-developed and well-nourished. No distress.  HENT:  Nose: Nose normal.  Mouth/Throat: Oropharynx is clear and moist.  Eyes: Conjunctivae are normal. Right eye exhibits no discharge. Left eye exhibits no discharge.  Neck: Neck supple. No thyromegaly present.  Cardiovascular: Normal rate and regular rhythm.   Pulmonary/Chest: Breath sounds normal. No respiratory distress. She has no wheezes.  Abdominal: Soft. Bowel sounds are normal. There is no tenderness.  Musculoskeletal: She exhibits no edema or tenderness.  Left arm larger than right.    Lymphadenopathy:    She has no cervical adenopathy.  Skin: No rash noted. No erythema.  Psychiatric: She has a normal mood and affect. Her behavior is normal.    BP 110/60 mmHg  Pulse 75  Temp(Src) 98.3 F (36.8 C) (Oral)  Ht 5\' 7"  (1.702 m)  Wt 148 lb 2 oz (67.189 kg)  BMI 23.19 kg/m2  SpO2 97%  LMP 10/08/2011 Wt Readings from Last 3 Encounters:  01/17/15 148 lb 2 oz (67.189 kg)  09/25/14 137 lb 4 oz (62.256 kg)  07/11/14 135 lb 6 oz (61.406 kg)     Lab Results  Component Value Date   WBC 5.5 09/26/2014   HGB 12.4 09/26/2014   HCT 36 09/26/2014   PLT 226 09/26/2014   GLUCOSE 98 10/06/2012   CHOL 161 12/12/2014   TRIG 44 12/12/2014   HDL 75* 12/12/2014   LDLCALC 77 12/12/2014   ALT 29 11/16/2014   AST 39* 11/16/2014   NA 141 09/26/2014   K 4.1 09/26/2014   CL 96* 10/06/2012   CREATININE 0.8 09/26/2014   BUN 13 09/26/2014   CO2 27 10/06/2012   TSH 3.75 02/23/2014   INR 1.0 09/26/2014   HGBA1C 4.9 12/12/2014       Assessment & Plan:   Problem List Items  Addressed This Visit    Anemia    Has had extensive w/up.  Has had iron infusion.  Last hgb wnl.  Follow.       ANXIETY DEPRESSION    Followed by psychiatry.  Seeing Dr Toy Care.  She is planning to discuss the medication with Dr Toy Care.        Relevant Medications   TRINTELLIX 20 MG TABS   Essential hypertension    Blood pressure under good control.  Continue  same medication regimen.  Follow pressures.  Follow metabolic panel.        Hypertrophic obstructive cardiomyopathy    Followed by cardiology.  Sees Dr Mina Marble.  Was trying to cut back diuretic.  Has restarted.  Follow.       Hypothyroidism    On thyroid replacement.  Follow tsh.        IBS    Bowels stable.       Occlusive thrombus    Had occlusive thrombus left internal jugular.  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  Saw hematology.  Off eliquis now.  She is concerned about the persistent increase in her left arm compared to right.  Will see if vascular surgery can reevaluate.        Other Visit Diagnoses    Screening breast examination    -  Primary    Relevant Orders    MM DIGITAL SCREENING BILATERAL        Einar Pheasant, MD

## 2015-01-17 NOTE — Progress Notes (Signed)
Pre-visit discussion using our clinic review tool. No additional management support is needed unless otherwise documented below in the visit note.  

## 2015-01-20 ENCOUNTER — Ambulatory Visit: Payer: 59 | Admitting: Internal Medicine

## 2015-01-25 LAB — HEPATIC FUNCTION PANEL
ALT: 27 U/L (ref 7–35)
AST: 34 U/L (ref 13–35)
Alkaline Phosphatase: 83 U/L (ref 25–125)
BILIRUBIN DIRECT: 0.17 mg/dL (ref 0.01–0.4)
Bilirubin, Total: 0.6 mg/dL

## 2015-01-25 LAB — CBC AND DIFFERENTIAL
HEMATOCRIT: 36 % (ref 36–46)
Hemoglobin: 11.4 g/dL — AB (ref 12.0–16.0)
NEUTROS ABS: 2 /uL
Platelets: 184 10*3/uL (ref 150–399)
WBC: 4.1 10^3/mL

## 2015-01-25 LAB — TSH: TSH: 0.86 u[IU]/mL (ref 0.41–5.90)

## 2015-01-26 ENCOUNTER — Encounter: Payer: Self-pay | Admitting: Internal Medicine

## 2015-01-26 NOTE — Assessment & Plan Note (Addendum)
Blood pressure under good control.  Continue same medication regimen.  Follow pressures.  Follow metabolic panel.   

## 2015-01-26 NOTE — Assessment & Plan Note (Signed)
Bowels stable.  

## 2015-01-26 NOTE — Assessment & Plan Note (Signed)
On thyroid replacement.  Follow tsh.  

## 2015-01-26 NOTE — Assessment & Plan Note (Signed)
Followed by psychiatry.  Seeing Dr Toy Care.  She is planning to discuss the medication with Dr Toy Care.

## 2015-01-26 NOTE — Assessment & Plan Note (Signed)
Had occlusive thrombus left internal jugular.  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  Saw hematology.  Off eliquis now.  She is concerned about the persistent increase in her left arm compared to right.  Will see if vascular surgery can reevaluate.

## 2015-01-26 NOTE — Assessment & Plan Note (Signed)
Followed by cardiology.  Sees Dr Mina Marble.  Was trying to cut back diuretic.  Has restarted.  Follow.

## 2015-01-26 NOTE — Assessment & Plan Note (Signed)
Has had extensive w/up.  Has had iron infusion.  Last hgb wnl.  Follow.

## 2015-01-28 ENCOUNTER — Encounter: Payer: Self-pay | Admitting: Internal Medicine

## 2015-01-29 NOTE — Telephone Encounter (Signed)
Opened in error

## 2015-02-11 ENCOUNTER — Ambulatory Visit: Payer: 59 | Attending: Internal Medicine

## 2015-02-11 ENCOUNTER — Encounter: Payer: Self-pay | Admitting: Internal Medicine

## 2015-03-10 ENCOUNTER — Encounter (INDEPENDENT_AMBULATORY_CARE_PROVIDER_SITE_OTHER): Payer: Self-pay

## 2015-03-10 ENCOUNTER — Encounter: Payer: Self-pay | Admitting: Internal Medicine

## 2015-03-12 NOTE — Telephone Encounter (Signed)
Please schedule her for earlier appt with Dr Mina Marble - for left arm swelling - persistent and weight gain.  Thanks.  (this is the one we talked about)

## 2015-03-17 ENCOUNTER — Other Ambulatory Visit: Payer: Self-pay | Admitting: Internal Medicine

## 2015-03-24 ENCOUNTER — Ambulatory Visit: Payer: 59 | Admitting: Internal Medicine

## 2015-03-26 DIAGNOSIS — I447 Left bundle-branch block, unspecified: Secondary | ICD-10-CM | POA: Insufficient documentation

## 2015-03-27 ENCOUNTER — Telehealth: Payer: Self-pay | Admitting: *Deleted

## 2015-03-27 NOTE — Telephone Encounter (Signed)
Patient had to reschedule appt 11/11, patient was placed on the schedule for 05/29/2015, patient was okay with the appt, but wanted to know if Dr. Nicki Reaper would be okay, due to appt being rescheduled twice.

## 2015-03-28 NOTE — Telephone Encounter (Signed)
See if she can come in on 04/03/15 at 8:30 - 30 minute appt.  Block the slot.  Thanks

## 2015-03-28 NOTE — Telephone Encounter (Signed)
Called pt appt has been changed to 11/10 at 8:30.Marland Kitchen

## 2015-04-03 ENCOUNTER — Ambulatory Visit (INDEPENDENT_AMBULATORY_CARE_PROVIDER_SITE_OTHER): Payer: 59 | Admitting: Internal Medicine

## 2015-04-03 ENCOUNTER — Encounter: Payer: Self-pay | Admitting: Internal Medicine

## 2015-04-03 VITALS — BP 100/60 | HR 67 | Temp 98.0°F | Resp 18 | Ht 67.0 in | Wt 146.0 lb

## 2015-04-03 DIAGNOSIS — G43809 Other migraine, not intractable, without status migrainosus: Secondary | ICD-10-CM

## 2015-04-03 DIAGNOSIS — I10 Essential (primary) hypertension: Secondary | ICD-10-CM

## 2015-04-03 DIAGNOSIS — E039 Hypothyroidism, unspecified: Secondary | ICD-10-CM

## 2015-04-03 DIAGNOSIS — I8290 Acute embolism and thrombosis of unspecified vein: Secondary | ICD-10-CM

## 2015-04-03 DIAGNOSIS — F341 Dysthymic disorder: Secondary | ICD-10-CM

## 2015-04-03 DIAGNOSIS — I421 Obstructive hypertrophic cardiomyopathy: Secondary | ICD-10-CM | POA: Diagnosis not present

## 2015-04-03 DIAGNOSIS — D649 Anemia, unspecified: Secondary | ICD-10-CM

## 2015-04-03 DIAGNOSIS — K580 Irritable bowel syndrome with diarrhea: Secondary | ICD-10-CM

## 2015-04-03 NOTE — Progress Notes (Signed)
Patient ID: Carol Mahoney, female   DOB: 06-06-1975, 39 y.o.   MRN: [id number]   Subjective:    Patient ID: Carol Mahoney, female    DOB: 19-Oct-1975, 39 y.o.   MRN: [id number]  HPI  Patient with past history of HOCM, hypertension, GERD, hypothyroidism and depression/anxiety.  She comes in today to follow up on these issues.  Is s/p occlusive thrombus - left internal jugular.  S/p eliquis therapy.  Off now.  Persistent problems with left arm being larger than right.  Just evaluated by EP.  Felt no further w/up warranted. Weight is down a few pounds.  She occasionally is able to skip a dose of lasix.  Does report some fatigue.  She feels from a cardiac standpoint, things are stable.  breathing stable.   No acid reflux.  Bowels are stable.  Still with flares. Handling stress relatively well.      Past Medical History  Diagnosis Date  . Hypertrophic obstructive cardiomyopathy     mitral regurgitation, LAE - followed at [facility]  . Hypertension   . Hypothyroidism   . Anemia   . GERD (gastroesophageal reflux disease)   . H/O hiatal hernia   . Migraine headache   . Anxiety   . Depression   . PONV (postoperative nausea and vomiting)    Past Surgical History  Procedure Laterality Date  . Tonsillectomy    . Bunionectomy    . Partial gastrectomy    . Hiatal hernia repair    . Cholecystectomy    . Implantable cardioverter defibrillator revision  05/21/14    [facility]   Family History  Problem Relation Age of Onset  . Anesthesia problems Father   . Hypertension Mother   . Hashimoto's thyroiditis Maternal Grandmother   . Hypertension Maternal Grandmother   . Breast cancer Neg Hx   . Colon cancer Neg Hx    Social History   Social History  . Marital Status: Single    Spouse Name: N/A  . Number of Children: 1  . Years of Education: N/A   Occupational History  . [place]   Social History Main Topics  . Smoking status: Never Smoker   . Smokeless tobacco: Never Used  .  Alcohol Use: No  . Drug Use: No  . Sexual Activity: Not Currently   Other Topics Concern  . None   Social History Narrative    Outpatient Encounter Prescriptions as of 04/03/2015  Medication Sig  . acetaminophen (TYLENOL) 500 MG tablet Take 500 mg by mouth every 6 (six) hours as needed. For headache  . ALPRAZolam (XANAX) 0.5 MG tablet 0.5 mg 3 (three) times daily as needed.   . cyclobenzaprine (FLEXERIL) 10 MG tablet Take 1 tablet (10 mg total) by mouth daily as needed for muscle spasms.  Marland Kitchen FLUoxetine (PROZAC) 40 MG capsule Take 40 mg by mouth daily.  . furosemide (LASIX) 20 MG tablet Take 20 mg by mouth daily as needed.   . metoprolol tartrate (LOPRESSOR) 25 MG tablet Take 25 mg by mouth 2 (two) times daily.   . ondansetron (ZOFRAN) 4 MG tablet Take 1 tablet (4 mg total) by mouth 2 (two) times daily as needed for nausea or vomiting.  . pantoprazole (PROTONIX) 40 MG tablet Take 1 tablet by mouth  daily  . SUMAtriptan (IMITREX) 50 MG tablet Take 1 tablet by mouth  every 2 hours as needed for migraine(s)  . SYNTHROID 100 MCG tablet Take 1 tablet by mouth  daily before breakfast  . traZODone (DESYREL) 50 MG tablet 50 mg. Takes 1/2 tablet as needed at bedtime  . [DISCONTINUED] oxyCODONE-acetaminophen (PERCOCET/ROXICET) 5-325 MG per tablet as needed.  . [DISCONTINUED] TRINTELLIX 20 MG TABS Take 20 mg by mouth daily.   No facility-administered encounter medications on file as of 04/03/2015.    Review of Systems  Constitutional: Negative for appetite change.       Weight down from last check.    HENT: Negative for congestion and sinus pressure.   Respiratory: Negative for cough, chest tightness and shortness of breath.   Cardiovascular: Positive for leg swelling (intermittent flares.  takes lasix. ). Negative for chest pain and palpitations.  Gastrointestinal: Negative for nausea, vomiting and abdominal pain.       Bowels flare intermittently.   Genitourinary: Negative for dysuria and  difficulty urinating.  Musculoskeletal: Negative for back pain and joint swelling.  Skin: Negative for color change and rash.  Neurological: Negative for dizziness, light-headedness and headaches.  Hematological: Negative for adenopathy. Does not bruise/bleed easily.  Psychiatric/Behavioral: Negative for dysphoric mood and agitation.       Objective:    Physical Exam  Constitutional: She appears well-developed and well-nourished. No distress.  HENT:  Nose: Nose normal.  Mouth/Throat: Oropharynx is clear and moist.  Eyes: Conjunctivae are normal. Right eye exhibits no discharge. Left eye exhibits no discharge.  Neck: Neck supple. No thyromegaly present.  Cardiovascular: Normal rate and regular rhythm.   Pulmonary/Chest: Breath sounds normal. No respiratory distress. She has no wheezes.  Abdominal: Soft. Bowel sounds are normal. There is no tenderness.  Musculoskeletal: She exhibits no edema or tenderness.  Lymphadenopathy:    She has no cervical adenopathy.  Skin: No rash noted. No erythema.  Psychiatric: She has a normal mood and affect. Her behavior is normal.    BP 100/60 mmHg  Pulse 67  Temp(Src) 98 F (36.7 C) (Oral)  Resp 18  Ht 5\' 7"  (1.702 m)  Wt 146 lb (66.225 kg)  BMI 22.86 kg/m2  SpO2 97%  LMP 10/08/2011 Wt Readings from Last 3 Encounters:  04/03/15 146 lb (66.225 kg)  01/17/15 148 lb 2 oz (67.189 kg)  09/25/14 137 lb 4 oz (62.256 kg)     Lab Results  Component Value Date   WBC 4.1 01/25/2015   HGB 11.4* 01/25/2015   HCT 36 01/25/2015   PLT 184 01/25/2015   GLUCOSE 98 10/06/2012   CHOL 161 12/12/2014   TRIG 44 12/12/2014   HDL 75* 12/12/2014   LDLCALC 77 12/12/2014   ALT 27 01/25/2015   AST 34 01/25/2015   NA 141 09/26/2014   K 4.1 09/26/2014   CL 96* 10/06/2012   CREATININE 0.8 09/26/2014   BUN 13 09/26/2014   CO2 27 10/06/2012   TSH 0.86 01/25/2015   INR 1.0 09/26/2014   HGBA1C 4.9 12/12/2014       Assessment & Plan:   Problem List  Items Addressed This Visit    Anemia    Has had extensive w/up.  Is s/p iron infusion.  Follow hgb to confirm stable.      ANXIETY DEPRESSION    Followed by psychiatry.  Sees Dr Toy Care.        Relevant Medications   FLUoxetine (PROZAC) 40 MG capsule   Essential hypertension    Blood pressure as outlined.  Taking lasix prn.  Follow.  Check metabolic panel.       Hypertrophic obstructive cardiomyopathy ([place])    Followed  by cardiology.  Sees Dr Mina Marble.        Hypothyroidism    Continue thyroid replacement.  Follow tsh.       IBS    Bowels overall stable.  Still with intermittent flares.  Follow.        Migraine    Stable.  Follow.       Relevant Medications   FLUoxetine (PROZAC) 40 MG capsule   Occlusive thrombus - Primary    Had occlusive thrombus left internal jugular.  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  Saw hematology.  Off eliquis.  Was just evaluated for persistent left arm swelling.  Saw EP recently.  Felt no further w/up warranted.  Follow.            Einar Pheasant, MD

## 2015-04-03 NOTE — Progress Notes (Signed)
Pre-visit discussion using our clinic review tool. No additional management support is needed unless otherwise documented below in the visit note.  

## 2015-04-04 ENCOUNTER — Ambulatory Visit: Payer: 59 | Admitting: Internal Medicine

## 2015-04-06 ENCOUNTER — Encounter: Payer: Self-pay | Admitting: Internal Medicine

## 2015-04-06 NOTE — Assessment & Plan Note (Signed)
Stable.  Follow.   

## 2015-04-06 NOTE — Assessment & Plan Note (Signed)
Followed by cardiology.  Sees Dr Mina Marble.

## 2015-04-06 NOTE — Assessment & Plan Note (Signed)
Followed by psychiatry.  Sees Dr Toy Care.

## 2015-04-06 NOTE — Assessment & Plan Note (Signed)
Blood pressure as outlined.  Taking lasix prn.  Follow.  Check metabolic panel.

## 2015-04-06 NOTE — Assessment & Plan Note (Signed)
Has had extensive w/up.  Is s/p iron infusion.  Follow hgb to confirm stable.

## 2015-04-06 NOTE — Assessment & Plan Note (Signed)
Had occlusive thrombus left internal jugular.  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  Saw hematology.  Off eliquis.  Was just evaluated for persistent left arm swelling.  Saw EP recently.  Felt no further w/up warranted.  Follow.

## 2015-04-06 NOTE — Assessment & Plan Note (Signed)
Continue thyroid replacement.  Follow tsh.   

## 2015-04-06 NOTE — Assessment & Plan Note (Signed)
Bowels overall stable.  Still with intermittent flares.  Follow.

## 2015-04-07 ENCOUNTER — Other Ambulatory Visit: Payer: Self-pay | Admitting: Internal Medicine

## 2015-05-29 ENCOUNTER — Ambulatory Visit: Payer: 59 | Admitting: Internal Medicine

## 2015-07-07 ENCOUNTER — Ambulatory Visit: Payer: 59 | Admitting: Internal Medicine

## 2015-07-11 ENCOUNTER — Ambulatory Visit: Payer: 59 | Admitting: Internal Medicine

## 2015-07-23 ENCOUNTER — Ambulatory Visit (INDEPENDENT_AMBULATORY_CARE_PROVIDER_SITE_OTHER): Payer: 59 | Admitting: Internal Medicine

## 2015-07-23 ENCOUNTER — Encounter: Payer: Self-pay | Admitting: Internal Medicine

## 2015-07-23 VITALS — BP 100/70 | HR 68 | Temp 98.0°F | Resp 18 | Ht 67.0 in | Wt 143.2 lb

## 2015-07-23 DIAGNOSIS — I421 Obstructive hypertrophic cardiomyopathy: Secondary | ICD-10-CM

## 2015-07-23 DIAGNOSIS — I10 Essential (primary) hypertension: Secondary | ICD-10-CM | POA: Diagnosis not present

## 2015-07-23 DIAGNOSIS — E039 Hypothyroidism, unspecified: Secondary | ICD-10-CM

## 2015-07-23 DIAGNOSIS — D649 Anemia, unspecified: Secondary | ICD-10-CM

## 2015-07-23 DIAGNOSIS — I8290 Acute embolism and thrombosis of unspecified vein: Secondary | ICD-10-CM | POA: Diagnosis not present

## 2015-07-23 DIAGNOSIS — F341 Dysthymic disorder: Secondary | ICD-10-CM

## 2015-07-23 DIAGNOSIS — K580 Irritable bowel syndrome with diarrhea: Secondary | ICD-10-CM

## 2015-07-23 DIAGNOSIS — R5383 Other fatigue: Secondary | ICD-10-CM

## 2015-07-23 MED ORDER — SYNTHROID 100 MCG PO TABS: ORAL_TABLET | ORAL | Status: DC

## 2015-07-23 NOTE — Progress Notes (Signed)
Pre-visit discussion using our clinic review tool. No additional management support is needed unless otherwise documented below in the visit note.  

## 2015-07-23 NOTE — Progress Notes (Signed)
Patient ID: Carol Mahoney, female   DOB: 11/01/1975, 40 y.o.   MRN: [id number]   Subjective:    Patient ID: Carol Mahoney, female    DOB: 1975-07-14, 40 y.o.   MRN: [id number]  HPI  Patient with past history of HOCM s/p septal myomectomy with partial repair of the mitral valve.  She also has ICD in place.  Followed by cardiology.  Also has a history of DVT (left upper extremity), IBS and anxiety/depression.  She comes in today for a scheduled follow up.  She is followed by cardiology.  She is off prozac.  Taking fetzima now.  Had increased dose to 120.  Blood pressure low.  Since on lower dose of fetzima blood pressure doing better.  Still having increased problems with her bowels.  Some days worse than others.  She has seen GI previously.  Nothing has helped symptoms.  No abdominal pain or cramping.  Breathing stable.  Increased stress.     Past Medical History  Diagnosis Date  . Hypertrophic obstructive cardiomyopathy     mitral regurgitation, LAE - followed at [facility]  . Hypertension   . Hypothyroidism   . Anemia   . GERD (gastroesophageal reflux disease)   . H/O hiatal hernia   . Migraine headache   . Anxiety   . Depression   . PONV (postoperative nausea and vomiting)    Past Surgical History  Procedure Laterality Date  . Tonsillectomy    . Bunionectomy    . Partial gastrectomy    . Hiatal hernia repair    . Cholecystectomy    . Implantable cardioverter defibrillator revision  05/21/14    [facility]   Family History  Problem Relation Age of Onset  . Anesthesia problems Father   . Hypertension Mother   . Hashimoto's thyroiditis Maternal Grandmother   . Hypertension Maternal Grandmother   . Breast cancer Neg Hx   . Colon cancer Neg Hx    Social History   Social History  . Marital Status: Single    Spouse Name: N/A  . Number of Children: 1  . Years of Education: N/A   Occupational History  . [place]   Social History Main Topics  . Smoking status: Never  Smoker   . Smokeless tobacco: Never Used  . Alcohol Use: No  . Drug Use: No  . Sexual Activity: Not Currently   Other Topics Concern  . None   Social History Narrative    Outpatient Encounter Prescriptions as of 07/23/2015  Medication Sig  . acetaminophen (TYLENOL) 500 MG tablet Take 500 mg by mouth every 6 (six) hours as needed. For headache  . ALPRAZolam (XANAX) 0.5 MG tablet 0.5 mg 3 (three) times daily as needed.   . cyclobenzaprine (FLEXERIL) 10 MG tablet Take 1 tablet (10 mg total) by mouth daily as needed for muscle spasms.  Marland Kitchen FETZIMA 80 MG CP24   . furosemide (LASIX) 20 MG tablet Take 20 mg by mouth daily as needed.   . metoprolol tartrate (LOPRESSOR) 25 MG tablet Take 25 mg by mouth 2 (two) times daily.   . ondansetron (ZOFRAN) 4 MG tablet Take 1 tablet (4 mg total) by mouth 2 (two) times daily as needed for nausea or vomiting.  . pantoprazole (PROTONIX) 40 MG tablet Take 1 tablet by mouth  daily  . SUMAtriptan (IMITREX) 50 MG tablet Take 1 tablet by mouth  every 2 hours as needed for migraine(s)  . SYNTHROID 100 MCG tablet Take  1 tablet by mouth  daily before breakfast  . traZODone (DESYREL) 50 MG tablet 50 mg. Takes 1/2 tablet as needed at bedtime  . [DISCONTINUED] FLUoxetine (PROZAC) 40 MG capsule Take 40 mg by mouth daily.  . [DISCONTINUED] SYNTHROID 100 MCG tablet Take 1 tablet by mouth  daily before breakfast   No facility-administered encounter medications on file as of 07/23/2015.    Review of Systems  Constitutional: Negative for appetite change and unexpected weight change.  HENT: Negative for congestion and sinus pressure.   Respiratory: Negative for cough and chest tightness.        Breathing stable.    Cardiovascular: Negative for chest pain and palpitations.       States will have swelling if does not take fluid pill.    Gastrointestinal: Negative for nausea, vomiting and abdominal pain.       Persistent bowel change as outlined.  Intermittent flares of loose  stool.  Affects her quality of life.    Genitourinary: Negative for dysuria and difficulty urinating.  Musculoskeletal: Negative for myalgias and back pain.  Skin: Negative for color change and rash.  Neurological: Negative for dizziness, light-headedness and headaches.  Psychiatric/Behavioral: Negative for dysphoric mood and agitation.       Increased stress as outlined.         Objective:    Physical Exam  Constitutional: She appears well-developed and well-nourished. No distress.  HENT:  Nose: Nose normal.  Mouth/Throat: Oropharynx is clear and moist.  Neck: Neck supple. No thyromegaly present.  Cardiovascular: Normal rate and regular rhythm.   Pulmonary/Chest: Breath sounds normal. No respiratory distress. She has no wheezes.  Abdominal: Soft. Bowel sounds are normal. There is no tenderness.  Musculoskeletal: She exhibits no edema or tenderness.  Lymphadenopathy:    She has no cervical adenopathy.  Skin: No rash noted. No erythema.  Psychiatric: She has a normal mood and affect. Her behavior is normal.    BP 100/70 mmHg  Pulse 68  Temp(Src) 98 F (36.7 C) (Oral)  Resp 18  Ht 5\' 7"  (1.702 m)  Wt 143 lb 4 oz (64.978 kg)  BMI 22.43 kg/m2  SpO2 98%  LMP 10/08/2011 Wt Readings from Last 3 Encounters:  07/23/15 143 lb 4 oz (64.978 kg)  04/03/15 146 lb (66.225 kg)  01/17/15 148 lb 2 oz (67.189 kg)     Lab Results  Component Value Date   WBC 4.1 01/25/2015   HGB 11.4* 01/25/2015   HCT 36 01/25/2015   PLT 184 01/25/2015   GLUCOSE 98 10/06/2012   CHOL 161 12/12/2014   TRIG 44 12/12/2014   HDL 75* 12/12/2014   LDLCALC 77 12/12/2014   ALT 27 01/25/2015   AST 34 01/25/2015   NA 141 09/26/2014   K 4.1 09/26/2014   CL 96* 10/06/2012   CREATININE 0.8 09/26/2014   BUN 13 09/26/2014   CO2 27 10/06/2012   TSH 0.86 01/25/2015   INR 1.0 09/26/2014   HGBA1C 4.9 12/12/2014       Assessment & Plan:   Problem List Items Addressed This Visit    Anemia    Has had  extensive w/up.  Recheck cbc.        ANXIETY DEPRESSION    Followed by psychiatry.        Relevant Medications   FETZIMA 80 MG CP24   Essential hypertension    Blood pressure runs on the low side now.  She reports having to take the diuretic secondary to  lower extremity swelling.  Discussed trying to minimize amount taking.        Fatigue    Felt to be multifactorial.  Check cbc and metabolic panel.  Check tsh.  Try to limit amount of diuretic use.  Keep f/u with cardiology.  Keep f/u with psychiatry.  Changing medication.        Hypertrophic obstructive cardiomyopathy ([place])    Followed by cardiology.        Hypothyroidism    On thyroid replacement.  Follow tsh.       Relevant Medications   SYNTHROID 100 MCG tablet   IBS    Persistent intermittent bowel flares.  Has see GI.  Discussed with her today.  She declines further evaluation at this point.  Does affect her quality of life.        Occlusive thrombus - Primary    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  Saw hematology.  Off eliquis.  Follow.            Einar Pheasant, MD

## 2015-07-28 ENCOUNTER — Encounter: Payer: Self-pay | Admitting: Internal Medicine

## 2015-07-28 ENCOUNTER — Ambulatory Visit (INDEPENDENT_AMBULATORY_CARE_PROVIDER_SITE_OTHER): Payer: 59 | Admitting: Internal Medicine

## 2015-07-28 VITALS — BP 83/58 | HR 81 | Temp 98.6°F | Wt 142.2 lb

## 2015-07-28 DIAGNOSIS — J069 Acute upper respiratory infection, unspecified: Secondary | ICD-10-CM | POA: Insufficient documentation

## 2015-07-28 DIAGNOSIS — I959 Hypotension, unspecified: Secondary | ICD-10-CM | POA: Insufficient documentation

## 2015-07-28 DIAGNOSIS — B9789 Other viral agents as the cause of diseases classified elsewhere: Principal | ICD-10-CM

## 2015-07-28 LAB — POCT INFLUENZA A/B
INFLUENZA A, POC: NEGATIVE
INFLUENZA B, POC: NEGATIVE

## 2015-07-28 MED ORDER — HYDROCODONE-HOMATROPINE 5-1.5 MG/5ML PO SYRP: 5.0000 mL | ORAL_SOLUTION | Freq: Three times a day (TID) | ORAL | Status: DC | PRN

## 2015-07-28 NOTE — Assessment & Plan Note (Signed)
On thyroid replacement.  Follow tsh.  

## 2015-07-28 NOTE — Assessment & Plan Note (Signed)
Felt to be multifactorial.  Check cbc and metabolic panel.  Check tsh.  Try to limit amount of diuretic use.  Keep f/u with cardiology.  Keep f/u with psychiatry.  Changing medication.

## 2015-07-28 NOTE — Assessment & Plan Note (Signed)
Followed by psychiatry 

## 2015-07-28 NOTE — Assessment & Plan Note (Signed)
Blood pressure runs on the low side now.  She reports having to take the diuretic secondary to lower extremity swelling.  Discussed trying to minimize amount taking.

## 2015-07-28 NOTE — Progress Notes (Addendum)
Subjective:    Patient ID: Carol Mahoney, female    DOB: [birth year], 40 y.o.   MRN: [id number]  HPI  40YO female presents for acute visit.  Developed fever, cough, runny nose on Saturday. Bilateral ear pain. Fever mostly at night. Highest 100.62F Cough is non-productive. No myalgia. No NVD. Holding her Furosemide because of illness. No dyspnea. No change in LEE.  Wt Readings from Last 3 Encounters:  07/28/15 142 lb 4 oz (64.524 kg)  07/23/15 143 lb 4 oz (64.978 kg)  04/03/15 146 lb (66.225 kg)   BP Readings from Last 3 Encounters:  07/28/15 83/58  07/23/15 100/70  04/03/15 100/60    Past Medical History  Diagnosis Date  . Hypertrophic obstructive cardiomyopathy     mitral regurgitation, LAE - followed at [facility]  . Hypertension   . Hypothyroidism   . Anemia   . GERD (gastroesophageal reflux disease)   . H/O hiatal hernia   . Migraine headache   . Anxiety   . Depression   . PONV (postoperative nausea and vomiting)    Family History  Problem Relation Age of Onset  . Anesthesia problems Father   . Hypertension Mother   . Hashimoto's thyroiditis Maternal Grandmother   . Hypertension Maternal Grandmother   . Breast cancer Neg Hx   . Colon cancer Neg Hx    Past Surgical History  Procedure Laterality Date  . Tonsillectomy    . Bunionectomy    . Partial gastrectomy    . Hiatal hernia repair    . Cholecystectomy    . 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 Marital Status: Single    Spouse Name: N/A  . Number of Children: 1  . Years of Education: N/A   Occupational History  . [employer]   Social History Main Topics  . Smoking status: Never Smoker   . Smokeless tobacco: Never Used  . Alcohol Use: No  . Drug Use: No  . Sexual Activity: Not Currently   Other Topics Concern  . None   Social History Narrative    Review of Systems  Constitutional: Positive for fever and  fatigue. Negative for chills and unexpected weight change.  HENT: Positive for congestion, postnasal drip and rhinorrhea. Negative for ear discharge, ear pain, facial swelling, hearing loss, mouth sores, nosebleeds, sinus pressure, sneezing, sore throat, tinnitus, trouble swallowing and voice change.   Eyes: Negative for pain, discharge, redness and visual disturbance.  Respiratory: Positive for cough. Negative for chest tightness, shortness of breath, wheezing and stridor.   Cardiovascular: Negative for chest pain, palpitations and leg swelling.  Musculoskeletal: Negative for myalgias, arthralgias, neck pain and neck stiffness.  Skin: Negative for color change and rash.  Neurological: Negative for dizziness, weakness, light-headedness and headaches.  Hematological: Negative for adenopathy.       Objective:    BP 83/58 mmHg  Pulse 81  Temp(Src) 98.6 F (37 C) (Oral)  Wt 142 lb 4 oz (64.524 kg)  SpO2 96%  LMP 10/08/2011 Physical Exam  Constitutional: She is oriented to person, place, and time. She appears well-developed and well-nourished. No distress.  HENT:  Head: Normocephalic and atraumatic.  Right Ear: External ear normal.  Left Ear: External ear normal.  Nose: Nose normal.  Mouth/Throat: Oropharynx is clear and moist. No oropharyngeal exudate.  Eyes: Conjunctivae are normal. Pupils are equal, round, and reactive to light. Right eye exhibits no  discharge. Left eye exhibits no discharge. No scleral icterus.  Neck: Normal range of motion. Neck supple. No tracheal deviation present. No thyromegaly present.  Cardiovascular: Normal rate, regular rhythm and intact distal pulses.  Exam reveals no gallop and no friction rub.   Murmur heard. Pulmonary/Chest: Effort normal. No accessory muscle usage. No respiratory distress. She has no decreased breath sounds. She has no wheezes. She has rhonchi (few scattered). She has no rales. She exhibits no tenderness.  Musculoskeletal: Normal range  of motion. She exhibits no edema or tenderness.  Lymphadenopathy:    She has no cervical adenopathy.  Neurological: She is alert and oriented to person, place, and time. No cranial nerve deficit. She exhibits normal muscle tone. Coordination normal.  Skin: Skin is warm and dry. No rash noted. She is not diaphoretic. No erythema. No pallor.  Psychiatric: She has a normal mood and affect. Her behavior is normal. Judgment and thought content normal.          Assessment & Plan:   Problem List Items Addressed This Visit      Unprioritized   Hypotension    BP Readings from Last 3 Encounters:  07/28/15 83/58  07/23/15 100/70  04/03/15 100/60   Hypotension chronic per pt. Recently dose of Fetzima reduced. She is holding Furosemide. Encouraged fluids. Recheck 2 days and prn.      Viral URI with cough - Primary    Symptoms and exam c/w viral URI. Rapid flu neg. Encouraged rest, adequate fluids. Use Hydrocodone syrup prn for cough. Discussed risks of this medication. Follow up recheck in 2 days and prn. Return precautions given.      Relevant Medications   HYDROcodone-homatropine (HYCODAN) 5-1.5 MG/5ML syrup   Other Relevant Orders   POCT Influenza A/B       Return in about 2 days (around 07/30/2015) for Recheck.  Ronette Deter, MD Internal [facility]

## 2015-07-28 NOTE — Progress Notes (Signed)
Pre visit review using our clinic review tool, if applicable. No additional management support is needed unless otherwise documented below in the visit note. 

## 2015-07-28 NOTE — Assessment & Plan Note (Signed)
Followed by cardiology 

## 2015-07-28 NOTE — Assessment & Plan Note (Signed)
Has had extensive w/up.  Recheck cbc.

## 2015-07-28 NOTE — Patient Instructions (Signed)
Upper Respiratory Infection, Adult Most upper respiratory infections (URIs) are a viral infection of the air passages leading to the lungs. A URI affects the nose, throat, and upper air passages. The most common type of URI is nasopharyngitis and is typically referred to as "the common cold." URIs run their course and usually go away on their own. Most of the time, a URI does not require medical attention, but sometimes a bacterial infection in the upper airways can follow a viral infection. This is called a secondary infection. Sinus and middle ear infections are common types of secondary upper respiratory infections. Bacterial pneumonia can also complicate a URI. A URI can worsen asthma and chronic obstructive pulmonary disease (COPD). Sometimes, these complications can require emergency medical care and may be life threatening.  CAUSES Almost all URIs are caused by viruses. A virus is a type of germ and can spread from one person to another.  RISKS FACTORS You may be at risk for a URI if:   You smoke.   You have chronic heart or lung disease.  You have a weakened defense (immune) system.   You are very young or very old.   You have nasal allergies or asthma.  You work in crowded or poorly ventilated areas.  You work in health care facilities or schools. SIGNS AND SYMPTOMS  Symptoms typically develop 2-3 days after you come in contact with a cold virus. Most viral URIs last 7-10 days. However, viral URIs from the influenza virus (flu virus) can last 14-18 days and are typically more severe. Symptoms may include:   Runny or stuffy (congested) nose.   Sneezing.   Cough.   Sore throat.   Headache.   Fatigue.   Fever.   Loss of appetite.   Pain in your forehead, behind your eyes, and over your cheekbones (sinus pain).  Muscle aches.  DIAGNOSIS  Your health care provider may diagnose a URI by:  Physical exam.  Tests to check that your symptoms are not due to  another condition such as:  Strep throat.  Sinusitis.  Pneumonia.  Asthma. TREATMENT  A URI goes away on its own with time. It cannot be cured with medicines, but medicines may be prescribed or recommended to relieve symptoms. Medicines may help:  Reduce your fever.  Reduce your cough.  Relieve nasal congestion. HOME CARE INSTRUCTIONS   Take medicines only as directed by your health care provider.   Gargle warm saltwater or take cough drops to comfort your throat as directed by your health care provider.  Use a warm mist humidifier or inhale steam from a shower to increase air moisture. This may make it easier to breathe.  Drink enough fluid to keep your urine clear or pale yellow.   Eat soups and other clear broths and maintain good nutrition.   Rest as needed.   Return to work when your temperature has returned to normal or as your health care provider advises. You may need to stay home longer to avoid infecting others. You can also use a face mask and careful hand washing to prevent spread of the virus.  Increase the usage of your inhaler if you have asthma.   Do not use any tobacco products, including cigarettes, chewing tobacco, or electronic cigarettes. If you need help quitting, ask your health care provider. PREVENTION  The best way to protect yourself from getting a cold is to practice good hygiene.   Avoid oral or hand contact with people with cold   symptoms.   Wash your hands often if contact occurs.  There is no clear evidence that vitamin C, vitamin E, echinacea, or exercise reduces the chance of developing a cold. However, it is always recommended to get plenty of rest, exercise, and practice good nutrition.  SEEK MEDICAL CARE IF:   You are getting worse rather than better.   Your symptoms are not controlled by medicine.   You have chills.  You have worsening shortness of breath.  You have brown or red mucus.  You have yellow or brown nasal  discharge.  You have pain in your face, especially when you bend forward.  You have a fever.  You have swollen neck glands.  You have pain while swallowing.  You have white areas in the back of your throat. SEEK IMMEDIATE MEDICAL CARE IF:   You have severe or persistent:  Headache.  Ear pain.  Sinus pain.  Chest pain.  You have chronic lung disease and any of the following:  Wheezing.  Prolonged cough.  Coughing up blood.  A change in your usual mucus.  You have a stiff neck.  You have changes in your:  Vision.  Hearing.  Thinking.  Mood. MAKE SURE YOU:   Understand these instructions.  Will watch your condition.  Will get help right away if you are not doing well or get worse.   This information is not intended to replace advice given to you by your health care provider. Make sure you discuss any questions you have with your health care provider.   Document Released: 11/03/2000 Document Revised: 09/24/2014 Document Reviewed: 08/15/2013 Elsevier Interactive Patient Education 2016 Elsevier Inc.  

## 2015-07-28 NOTE — Assessment & Plan Note (Signed)
Symptoms and exam c/w viral URI. Rapid flu neg. Encouraged rest, adequate fluids. Use Hydrocodone syrup prn for cough. Discussed risks of this medication. Follow up recheck in 2 days and prn. Return precautions given.

## 2015-07-28 NOTE — Assessment & Plan Note (Signed)
Persistent intermittent bowel flares.  Has see GI.  Discussed with her today.  She declines further evaluation at this point.  Does affect her quality of life.

## 2015-07-28 NOTE — Assessment & Plan Note (Signed)
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  Saw hematology.  Off eliquis.  Follow.

## 2015-07-28 NOTE — Assessment & Plan Note (Signed)
BP Readings from Last 3 Encounters:  07/28/15 83/58  07/23/15 100/70  04/03/15 100/60   Hypotension chronic per pt. Recently dose of Fetzima reduced. She is holding Furosemide. Encouraged fluids. Recheck 2 days and prn.

## 2015-07-30 ENCOUNTER — Ambulatory Visit (INDEPENDENT_AMBULATORY_CARE_PROVIDER_SITE_OTHER): Payer: 59 | Admitting: Internal Medicine

## 2015-07-30 ENCOUNTER — Encounter: Payer: Self-pay | Admitting: Internal Medicine

## 2015-07-30 VITALS — BP 116/82 | HR 79 | Temp 98.0°F | Ht 67.0 in | Wt 140.2 lb

## 2015-07-30 DIAGNOSIS — B9789 Other viral agents as the cause of diseases classified elsewhere: Principal | ICD-10-CM

## 2015-07-30 DIAGNOSIS — J069 Acute upper respiratory infection, unspecified: Secondary | ICD-10-CM | POA: Diagnosis not present

## 2015-07-30 MED ORDER — AZITHROMYCIN 250 MG PO TABS: ORAL_TABLET | ORAL | Status: DC

## 2015-07-30 NOTE — Assessment & Plan Note (Signed)
Symptoms and exam are most consistent with viral infection. Will monitor, continue supportive care. If productive cough is worsening, or sinus pressure worsening over the weekend, or if any recurrent fever, she will start Azithromycin. Continue Hycodan prn for cough.

## 2015-07-30 NOTE — Progress Notes (Signed)
Pre visit review using our clinic review tool, if applicable. No additional management support is needed unless otherwise documented below in the visit note. 

## 2015-07-30 NOTE — Progress Notes (Signed)
Subjective:    Patient ID: Carol Mahoney, female    DOB: 05-08-1976, 40 y.o.   MRN: [id number]  HPI  40YO female presents for follow up. Seen on 3/6 for viral URI.  Cough seems worse. Non-productive generally. Some possible blood tinged sputum last night. Some sinus pressure. No dyspnea. Feeling tired. No fever.  Wt Readings from Last 3 Encounters:  07/30/15 140 lb 4 oz (63.617 kg)  07/28/15 142 lb 4 oz (64.524 kg)  07/23/15 143 lb 4 oz (64.978 kg)   BP Readings from Last 3 Encounters:  07/30/15 116/82  07/28/15 83/58  07/23/15 100/70    Past Medical History  Diagnosis Date  . Hypertrophic obstructive cardiomyopathy     mitral regurgitation, LAE - followed at [facility]  . Hypertension   . Hypothyroidism   . Anemia   . GERD (gastroesophageal reflux disease)   . H/O hiatal hernia   . Migraine headache   . Anxiety   . Depression   . PONV (postoperative nausea and vomiting)    Family History  Problem Relation Age of Onset  . Anesthesia problems Father   . Hypertension Mother   . Hashimoto's thyroiditis Maternal Grandmother   . Hypertension Maternal Grandmother   . Breast cancer Neg Hx   . Colon cancer Neg Hx    Past Surgical History  Procedure Laterality Date  . Tonsillectomy    . Bunionectomy    . Partial gastrectomy    . Hiatal hernia repair    . Cholecystectomy    . 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 Marital Status: Single    Spouse Name: N/A  . Number of Children: 1  . Years of Education: N/A   Occupational History  . [employer]   Social History Main Topics  . Smoking status: Never Smoker   . Smokeless tobacco: Never Used  . Alcohol Use: No  . Drug Use: No  . Sexual Activity: Not Currently   Other Topics Concern  . None   Social History Narrative    Review of Systems  Constitutional: Positive for fatigue. Negative for fever, chills, appetite change and  unexpected weight change.  HENT: Positive for congestion, postnasal drip, rhinorrhea and sinus pressure. Negative for ear discharge, ear pain, facial swelling, hearing loss, mouth sores, nosebleeds, sneezing, sore throat, tinnitus, trouble swallowing and voice change.   Eyes: Negative for pain, discharge, redness and visual disturbance.  Respiratory: Positive for cough. Negative for chest tightness, shortness of breath, wheezing and stridor.   Cardiovascular: Negative for chest pain, palpitations and leg swelling.  Gastrointestinal: Negative for nausea, vomiting, abdominal pain, diarrhea and constipation.  Musculoskeletal: Negative for myalgias, arthralgias, neck pain and neck stiffness.  Skin: Negative for color change and rash.  Neurological: Negative for dizziness, weakness, light-headedness and headaches.  Hematological: Negative for adenopathy. Does not bruise/bleed easily.  Psychiatric/Behavioral: Negative for dysphoric mood. The patient is not nervous/anxious.        Objective:    BP 116/82 mmHg  Pulse 79  Temp(Src) 98 F (36.7 C) (Oral)  Ht 5\' 7"  (1.702 m)  Wt 140 lb 4 oz (63.617 kg)  BMI 21.96 kg/m2  SpO2 98%  LMP 10/08/2011 Physical Exam  Constitutional: She is oriented to person, place, and time. She appears well-developed and well-nourished. No distress.  HENT:  Head: Normocephalic and atraumatic.  Right Ear: External ear normal.  Left Ear: External ear  normal.  Nose: Nose normal.  Mouth/Throat: Oropharynx is clear and moist. No oropharyngeal exudate.  Eyes: Conjunctivae are normal. Pupils are equal, round, and reactive to light. Right eye exhibits no discharge. Left eye exhibits no discharge. No scleral icterus.  Neck: Normal range of motion. Neck supple. No tracheal deviation present. No thyromegaly present.  Cardiovascular: Normal rate, regular rhythm, normal heart sounds and intact distal pulses.  Exam reveals no gallop and no friction rub.   No murmur  heard. Pulmonary/Chest: Effort normal. No accessory muscle usage. No tachypnea. No respiratory distress. She has no decreased breath sounds. She has no wheezes. She has rhonchi (few scattered). She has no rales. She exhibits no tenderness.    Musculoskeletal: Normal range of motion. She exhibits no edema or tenderness.  Lymphadenopathy:    She has no cervical adenopathy.  Neurological: She is alert and oriented to person, place, and time. No cranial nerve deficit. She exhibits normal muscle tone. Coordination normal.  Skin: Skin is warm and dry. No rash noted. She is not diaphoretic. No erythema. No pallor.  Psychiatric: She has a normal mood and affect. Her behavior is normal. Judgment and thought content normal.          Assessment & Plan:   Problem List Items Addressed This Visit      Unprioritized   Viral URI with cough - Primary    Symptoms and exam are most consistent with viral infection. Will monitor, continue supportive care. If productive cough is worsening, or sinus pressure worsening over the weekend, or if any recurrent fever, she will start Azithromycin. Continue Hycodan prn for cough.      Relevant Medications   azithromycin (ZITHROMAX Z-PAK) 250 MG tablet       Return in about 2 weeks (around 08/13/2015) for Recheck.  Ronette Deter, MD Internal Medicine [facility]

## 2015-07-30 NOTE — Patient Instructions (Signed)
Continue supportive care.  If symptoms are not improving, start Azithromycin.  Follow up in 2 weeks and sooner as needed.

## 2015-09-09 ENCOUNTER — Ambulatory Visit
Admission: RE | Admit: 2015-09-09 | Discharge: 2015-09-09 | Disposition: A | Discharge status: Home / Self Care | Payer: 59 | Source: Ambulatory Visit | Attending: Internal Medicine | Admitting: Internal Medicine

## 2015-09-09 DIAGNOSIS — Z1231 Encounter for screening mammogram for malignant neoplasm of breast: Secondary | ICD-10-CM | POA: Diagnosis present

## 2015-09-09 DIAGNOSIS — Z1239 Encounter for other screening for malignant neoplasm of breast: Secondary | ICD-10-CM

## 2015-09-10 ENCOUNTER — Other Ambulatory Visit: Payer: Self-pay | Admitting: Internal Medicine

## 2015-09-11 LAB — HEPATIC FUNCTION PANEL
ALT: 24 IU/L (ref 0–32)
AST: 30 IU/L (ref 0–40)
Albumin: 3.8 g/dL (ref 3.5–5.5)
Alkaline Phosphatase: 76 IU/L (ref 39–117)
BILIRUBIN, DIRECT: 0.12 mg/dL (ref 0.00–0.40)
Bilirubin Total: 0.4 mg/dL (ref 0.0–1.2)
TOTAL PROTEIN: 6 g/dL (ref 6.0–8.5)

## 2015-09-11 LAB — CBC WITH DIFFERENTIAL/PLATELET
BASOS: 3 %
Basophils Absolute: 0.1 10*3/uL (ref 0.0–0.2)
EOS (ABSOLUTE): 0.2 10*3/uL (ref 0.0–0.4)
Eos: 5 %
HEMOGLOBIN: 11.4 g/dL (ref 11.1–15.9)
Hematocrit: 34.9 % (ref 34.0–46.6)
IMMATURE GRANS (ABS): 0 10*3/uL (ref 0.0–0.1)
Immature Granulocytes: 0 %
LYMPHS: 31 %
Lymphocytes Absolute: 1.3 10*3/uL (ref 0.7–3.1)
MCH: 27.1 pg (ref 26.6–33.0)
MCHC: 32.7 g/dL (ref 31.5–35.7)
MCV: 83 fL (ref 79–97)
MONOCYTES: 8 %
Monocytes Absolute: 0.3 10*3/uL (ref 0.1–0.9)
NEUTROS ABS: 2.4 10*3/uL (ref 1.4–7.0)
Neutrophils: 53 %
Platelets: 196 10*3/uL (ref 150–379)
RBC: 4.2 x10E6/uL (ref 3.77–5.28)
RDW: 13.7 % (ref 12.3–15.4)
WBC: 4.3 10*3/uL (ref 3.4–10.8)

## 2015-09-11 LAB — BASIC METABOLIC PANEL
BUN / CREAT RATIO: 13 (ref 9–23)
BUN: 12 mg/dL (ref 6–24)
CHLORIDE: 98 mmol/L (ref 96–106)
CO2: 28 mmol/L (ref 18–29)
Calcium: 8.7 mg/dL (ref 8.7–10.2)
Creatinine, Ser: 0.9 mg/dL (ref 0.57–1.00)
GFR calc non Af Amer: 79 mL/min/{1.73_m2} (ref >59)
GFR, EST AFRICAN AMERICAN: 91 mL/min/{1.73_m2} (ref >59)
GLUCOSE: 88 mg/dL (ref 65–99)
POTASSIUM: 4 mmol/L (ref 3.5–5.2)
SODIUM: 138 mmol/L (ref 134–144)

## 2015-09-14 ENCOUNTER — Encounter: Payer: Self-pay | Admitting: Internal Medicine

## 2015-09-18 NOTE — Telephone Encounter (Signed)
Unread mychart message mailed to patient 

## 2015-10-23 ENCOUNTER — Encounter: Payer: Self-pay | Admitting: Internal Medicine

## 2015-10-23 ENCOUNTER — Ambulatory Visit (INDEPENDENT_AMBULATORY_CARE_PROVIDER_SITE_OTHER): Payer: 59 | Admitting: Internal Medicine

## 2015-10-23 VITALS — BP 80/60 | HR 56 | Temp 97.8°F | Resp 18 | Ht 67.0 in | Wt 143.2 lb

## 2015-10-23 DIAGNOSIS — E039 Hypothyroidism, unspecified: Secondary | ICD-10-CM | POA: Diagnosis not present

## 2015-10-23 DIAGNOSIS — F341 Dysthymic disorder: Secondary | ICD-10-CM | POA: Diagnosis not present

## 2015-10-23 DIAGNOSIS — I421 Obstructive hypertrophic cardiomyopathy: Secondary | ICD-10-CM

## 2015-10-23 DIAGNOSIS — K580 Irritable bowel syndrome with diarrhea: Secondary | ICD-10-CM | POA: Diagnosis not present

## 2015-10-23 DIAGNOSIS — I10 Essential (primary) hypertension: Secondary | ICD-10-CM | POA: Diagnosis not present

## 2015-10-23 MED ORDER — FLUCONAZOLE 150 MG PO TABS: 150.0000 mg | ORAL_TABLET | Freq: Once | ORAL | Status: DC

## 2015-10-23 NOTE — Progress Notes (Signed)
Patient ID: Carol Mahoney, female   DOB: 12/21/1975, 40 y.o.   MRN: [id number]   Subjective:    Patient ID: Carol Mahoney, female    DOB: [birth year], 40 y.o.   MRN: [id number]  HPI  Patient here for a scheduled follow up.  She is seeing cardiology for HOCM.  Her septal wall has increased thickness.  Just evaluated.  They are following.  Her symptoms are relatively stable.  Recommended f/u in 6 months.  Increased stress.  Persistent.  Seeing psychiatry.  Add wellbutrin.  Dose increased to 300mg  yesterday.  She does feel she is doing better.  Was taken out of work for 8 weeks.  Starting 1/2 days today.  Bowels stable.  Taking fluid pill prn.     Past Medical History  Diagnosis Date  . Hypertrophic obstructive cardiomyopathy     mitral regurgitation, LAE - followed at [facility]  . Hypertension   . Hypothyroidism   . Anemia   . GERD (gastroesophageal reflux disease)   . H/O hiatal hernia   . Migraine headache   . Anxiety   . Depression   . PONV (postoperative nausea and vomiting)    Past Surgical History  Procedure Laterality Date  . Tonsillectomy    . Bunionectomy    . Partial gastrectomy    . Hiatal hernia repair    . Cholecystectomy    . Implantable cardioverter defibrillator revision  05/21/14    [facility]   Family History  Problem Relation Age of Onset  . Anesthesia problems Father   . Hypertension Mother   . Hashimoto's thyroiditis Maternal Grandmother   . Hypertension Maternal Grandmother   . Breast cancer Neg Hx   . Colon cancer Neg Hx    Social History   Social History  . Marital Status: Single    Spouse Name: N/A  . Number of Children: 1  . Years of Education: N/A   Occupational History  . [employer]   Social History Main Topics  . Smoking status: Never Smoker   . Smokeless tobacco: Never Used  . Alcohol Use: No  . Drug Use: No  . Sexual Activity: Not Currently   Other Topics Concern  . None   Social History Narrative    Outpatient Encounter  Prescriptions as of 10/23/2015  Medication Sig  . acetaminophen (TYLENOL) 500 MG tablet Take 500 mg by mouth every 6 (six) hours as needed. For headache  . ALPRAZolam (XANAX) 0.5 MG tablet 0.5 mg 3 (three) times daily as needed.   Marland Kitchen buPROPion (WELLBUTRIN XL) 300 MG 24 hr tablet   . cyclobenzaprine (FLEXERIL) 10 MG tablet Take 1 tablet (10 mg total) by mouth daily as needed for muscle spasms.  . furosemide (LASIX) 20 MG tablet Take 20 mg by mouth daily as needed.   Marland Kitchen HYDROcodone-homatropine (HYCODAN) 5-1.5 MG/5ML syrup Take 5 mLs by mouth every 8 (eight) hours as needed for cough.  . metoprolol tartrate (LOPRESSOR) 25 MG tablet Take 25 mg by mouth 2 (two) times daily.   . ondansetron (ZOFRAN) 4 MG tablet Take 1 tablet (4 mg total) by mouth 2 (two) times daily as needed for nausea or vomiting.  . pantoprazole (PROTONIX) 40 MG tablet Take 1 tablet by mouth  daily  . sertraline (ZOLOFT) 100 MG tablet TAKE ONE TABLET (100MG ) BY MOUTH ONCE DAILY FOR 7 DAYS. THEN, TAKE 2 TABLETS (200MG ) DAILY  . SUMAtriptan (IMITREX) 50 MG tablet Take 1 tablet by mouth  every 2  hours as needed for migraine(s)  . SYNTHROID 100 MCG tablet Take 1 tablet by mouth  daily before breakfast  . traZODone (DESYREL) 50 MG tablet 50 mg. Takes 1/2 tablet as needed at bedtime  . fluconazole (DIFLUCAN) 150 MG tablet Take 1 tablet (150 mg total) by mouth once.  . [DISCONTINUED] azithromycin (ZITHROMAX Z-PAK) 250 MG tablet Take 2 pills day 1, then 1 pill daily days 2-5  . [DISCONTINUED] FETZIMA 80 MG CP24    No facility-administered encounter medications on file as of 10/23/2015.    Review of Systems  Constitutional: Negative for appetite change and unexpected weight change.  HENT: Negative for congestion and sinus pressure.   Respiratory: Negative for cough and chest tightness.        Breathing stable.    Cardiovascular: Negative for chest pain and palpitations.       Leg swelling better now.    Gastrointestinal: Negative for  nausea, vomiting and abdominal pain.       Bowels stable.    Genitourinary: Negative for dysuria and difficulty urinating.  Musculoskeletal: Negative for back pain and joint swelling.  Skin: Negative for color change and rash.  Neurological: Negative for dizziness and headaches.  Psychiatric/Behavioral:       Increased stress and some depression as outlined.  Followed by psychiatry.        Objective:     Blood pressure rechecked by me:  102/62  Physical Exam  Constitutional: She appears well-developed and well-nourished. No distress.  HENT:  Nose: Nose normal.  Mouth/Throat: Oropharynx is clear and moist.  Neck: Neck supple. No thyromegaly present.  Cardiovascular: Normal rate and regular rhythm.   2/6 systolic murmur  Pulmonary/Chest: Breath sounds normal. No respiratory distress. She has no wheezes.  Abdominal: Soft. Bowel sounds are normal. There is no tenderness.  Musculoskeletal: She exhibits no tenderness.  Swelling improved.    Lymphadenopathy:    She has no cervical adenopathy.  Skin: No rash noted. No erythema.  Psychiatric: She has a normal mood and affect. Her behavior is normal.    BP 80/60 mmHg  Pulse 56  Temp(Src) 97.8 F (36.6 C) (Oral)  Resp 18  Ht 5\' 7"  (1.702 m)  Wt 143 lb 4 oz (64.978 kg)  BMI 22.43 kg/m2  SpO2 98%  LMP 10/08/2011 Wt Readings from Last 3 Encounters:  10/23/15 143 lb 4 oz (64.978 kg)  07/30/15 140 lb 4 oz (63.617 kg)  07/28/15 142 lb 4 oz (64.524 kg)     Lab Results  Component Value Date   WBC 4.3 09/10/2015   HGB 11.4* 01/25/2015   HCT 34.9 09/10/2015   PLT 196 09/10/2015   GLUCOSE 88 09/10/2015   CHOL 161 12/12/2014   TRIG 44 12/12/2014   HDL 75* 12/12/2014   LDLCALC 77 12/12/2014   ALT 24 09/10/2015   AST 30 09/10/2015   NA 138 09/10/2015   K 4.0 09/10/2015   CL 98 09/10/2015   CREATININE 0.90 09/10/2015   BUN 12 09/10/2015   CO2 28 09/10/2015   TSH 0.86 01/25/2015   INR 1.0 09/26/2014   HGBA1C 4.9 12/12/2014     Mm Digital Screening Bilateral  09/09/2015  CLINICAL DATA:  Screening. EXAM: DIGITAL SCREENING BILATERAL MAMMOGRAM WITH CAD COMPARISON:  Previous exam(s). ACR Breast Density Category c: The breast tissue is heterogeneously dense, which may obscure small masses. FINDINGS: There are no findings suspicious for malignancy. Images were processed with CAD. IMPRESSION: No mammographic evidence of malignancy. A result letter  of this screening mammogram will be mailed directly to the patient. RECOMMENDATION: Screening mammogram in one year. (Code:SM-B-01Y) BI-RADS CATEGORY  1: Negative. Electronically Signed   By: Claudie Revering M.D.   On: 09/09/2015 12:13       Assessment & Plan:   Problem List Items Addressed This Visit    ANXIETY DEPRESSION    Seeing psychiatry.  Out of work past 8 weeks secondary to the increased stress and anxiety.  On wellbutrin.   Dose being adjusted.  Just increased to 300mg  yesterday.  Continue f/u with psychiatry.        Relevant Medications   buPROPion (WELLBUTRIN XL) 300 MG 24 hr tablet   sertraline (ZOLOFT) 100 MG tablet   Essential hypertension    Blood pressure low.  Still takes fluid pill prn.  Try to limit the dose.  Follow.        Hypertrophic obstructive cardiomyopathy ([place])    Followed by cardiology as outlined.        Hypothyroidism - Primary    On thyroid replacement.  Follow tsh.       IBS    Has seen GI.  Had w/up.  Feels stable.  Flares intermittently.  Follow.           Einar Pheasant, MD

## 2015-10-23 NOTE — Progress Notes (Signed)
Pre-visit discussion using our clinic review tool. No additional management support is needed unless otherwise documented below in the visit note.  

## 2015-10-26 ENCOUNTER — Encounter: Payer: Self-pay | Admitting: Internal Medicine

## 2015-10-26 NOTE — Assessment & Plan Note (Signed)
Seeing psychiatry.  Out of work past 8 weeks secondary to the increased stress and anxiety.  On wellbutrin.   Dose being adjusted.  Just increased to 300mg  yesterday.  Continue f/u with psychiatry.

## 2015-10-26 NOTE — Assessment & Plan Note (Signed)
Blood pressure low.  Still takes fluid pill prn.  Try to limit the dose.  Follow.

## 2015-10-26 NOTE — Assessment & Plan Note (Signed)
Has seen GI.  Had w/up.  Feels stable.  Flares intermittently.  Follow.

## 2015-10-26 NOTE — Assessment & Plan Note (Signed)
On thyroid replacement.  Follow tsh.  

## 2015-10-26 NOTE — Assessment & Plan Note (Signed)
Followed by cardiology as outlined.   °

## 2015-11-23 ENCOUNTER — Other Ambulatory Visit: Payer: Self-pay | Admitting: Internal Medicine

## 2015-12-01 ENCOUNTER — Encounter: Payer: Self-pay | Admitting: Internal Medicine

## 2015-12-01 MED ORDER — FLUCONAZOLE 150 MG PO TABS: 150.0000 mg | ORAL_TABLET | Freq: Once | ORAL | Status: DC

## 2015-12-01 NOTE — Telephone Encounter (Signed)
rx sent in for diflucan 150mg  x 1.

## 2015-12-04 ENCOUNTER — Other Ambulatory Visit: Payer: Self-pay | Admitting: Internal Medicine

## 2015-12-05 LAB — B12 AND FOLATE PANEL
Folate: 7 ng/mL (ref >3.0)
Vitamin B-12: 231 pg/mL (ref 211–946)

## 2015-12-05 LAB — VITAMIN D 25 HYDROXY (VIT D DEFICIENCY, FRACTURES): Vit D, 25-Hydroxy: 33.8 ng/mL (ref 30.0–100.0)

## 2015-12-05 LAB — BASIC METABOLIC PANEL
BUN/Creatinine Ratio: 19 (ref 9–23)
BUN: 14 mg/dL (ref 6–24)
CALCIUM: 8.4 mg/dL — AB (ref 8.7–10.2)
CO2: 23 mmol/L (ref 18–29)
Chloride: 103 mmol/L (ref 96–106)
Creatinine, Ser: 0.74 mg/dL (ref 0.57–1.00)
GFR, EST AFRICAN AMERICAN: 116 mL/min/{1.73_m2} (ref >59)
GFR, EST NON AFRICAN AMERICAN: 100 mL/min/{1.73_m2} (ref >59)
Glucose: 71 mg/dL (ref 65–99)
POTASSIUM: 3.4 mmol/L — AB (ref 3.5–5.2)
Sodium: 141 mmol/L (ref 134–144)

## 2015-12-05 LAB — TSH: TSH: 5.1 u[IU]/mL — AB (ref 0.450–4.500)

## 2015-12-05 LAB — RED BLOOD COUNT: RBC: 4.04 x10E6/uL (ref 3.77–5.28)

## 2015-12-10 ENCOUNTER — Encounter: Payer: Self-pay | Admitting: *Deleted

## 2015-12-16 ENCOUNTER — Telehealth: Payer: Self-pay | Admitting: Internal Medicine

## 2015-12-16 DIAGNOSIS — E876 Hypokalemia: Secondary | ICD-10-CM

## 2015-12-16 NOTE — Telephone Encounter (Signed)
Order placed for f/u potassium to be drawn at [facility].

## 2015-12-18 ENCOUNTER — Encounter: Payer: Self-pay | Admitting: *Deleted

## 2015-12-19 ENCOUNTER — Other Ambulatory Visit: Payer: 59

## 2015-12-22 NOTE — Telephone Encounter (Signed)
Pt aware of message & will go by [facility] tomorrow to have Potassium rechecked.

## 2016-01-30 ENCOUNTER — Encounter: Payer: Self-pay | Admitting: Internal Medicine

## 2016-01-30 ENCOUNTER — Ambulatory Visit (INDEPENDENT_AMBULATORY_CARE_PROVIDER_SITE_OTHER): Payer: 59 | Admitting: Internal Medicine

## 2016-01-30 VITALS — BP 90/58 | HR 61 | Temp 98.2°F | Ht 67.0 in | Wt 142.6 lb

## 2016-01-30 DIAGNOSIS — L900 Lichen sclerosus et atrophicus: Secondary | ICD-10-CM

## 2016-01-30 DIAGNOSIS — E039 Hypothyroidism, unspecified: Secondary | ICD-10-CM | POA: Diagnosis not present

## 2016-01-30 DIAGNOSIS — I421 Obstructive hypertrophic cardiomyopathy: Secondary | ICD-10-CM | POA: Diagnosis not present

## 2016-01-30 DIAGNOSIS — G43809 Other migraine, not intractable, without status migrainosus: Secondary | ICD-10-CM

## 2016-01-30 DIAGNOSIS — F341 Dysthymic disorder: Secondary | ICD-10-CM

## 2016-01-30 DIAGNOSIS — D649 Anemia, unspecified: Secondary | ICD-10-CM | POA: Diagnosis not present

## 2016-01-30 DIAGNOSIS — I8290 Acute embolism and thrombosis of unspecified vein: Secondary | ICD-10-CM | POA: Diagnosis not present

## 2016-01-30 DIAGNOSIS — K580 Irritable bowel syndrome with diarrhea: Secondary | ICD-10-CM

## 2016-01-30 DIAGNOSIS — I10 Essential (primary) hypertension: Secondary | ICD-10-CM

## 2016-01-30 NOTE — Progress Notes (Signed)
Patient ID: [name], female   DOB: 11-Aug-1975, 40 y.o.   MRN: [id number]   Subjective:    Patient ID: [name], female    DOB: 21-Dec-1975, 40 y.o.   MRN: [id number]  HPI  Patient here for a scheduled follow up.  She feels overall things are relatively stable.  She does report some increased fatigue.  Feels from a cardiac standpoint things are stable.  She did call cardiology previously about increased swelling in her feet and ankles.  This is better now.  Breathing overall stable.  Eating. Still with increased bowel issues.  No significant change, just persistent.  Has had extensive w/up previously.  Would like another opinion since has been 5 years since checked.  Still with increased stress.  Seeing Dr Toy Care.     Past Medical History:  Diagnosis Date  . Anemia   . Anxiety   . Depression   . GERD (gastroesophageal reflux disease)   . H/O hiatal hernia   . Hypertension   . Hypertrophic obstructive cardiomyopathy    mitral regurgitation, LAE - followed at [facility]  . Hypothyroidism   . Migraine headache   . PONV (postoperative nausea and vomiting)    Past Surgical History:  Procedure Laterality Date  . BUNIONECTOMY    . CHOLECYSTECTOMY    . HIATAL HERNIA REPAIR    . IMPLANTABLE CARDIOVERTER DEFIBRILLATOR REVISION  05/21/14   [facility]  . PARTIAL GASTRECTOMY    . TONSILLECTOMY     Family History  Problem Relation Age of Onset  . Anesthesia problems Father   . Hypertension Mother   . Hashimoto's thyroiditis Maternal Grandmother   . Hypertension Maternal Grandmother   . Breast cancer Neg Hx   . Colon cancer Neg Hx    Social History   Social History  . Marital status: Single    Spouse name: N/A  . Number of children: 1  . Years of education: N/A   Occupational History  . [employer]   Social History Main Topics  . Smoking status: Never Smoker  . Smokeless tobacco: Never Used  . Alcohol use No  . Drug use: No  . Sexual activity: Not Currently   Other  Topics Concern  . None   Social History Narrative  . None    Outpatient Encounter Prescriptions as of 01/30/2016  Medication Sig  . acetaminophen (TYLENOL) 500 MG tablet Take 500 mg by mouth every 6 (six) hours as needed. For headache  . ALPRAZolam (XANAX) 0.5 MG tablet 0.5 mg 3 (three) times daily as needed.   Marland Kitchen buPROPion (WELLBUTRIN XL) 300 MG 24 hr tablet Take 150 mg by mouth every other day.   . cyclobenzaprine (FLEXERIL) 10 MG tablet Take 1 tablet (10 mg total) by mouth daily as needed for muscle spasms.  Marland Kitchen desvenlafaxine (PRISTIQ) 100 MG 24 hr tablet Take 100 mg by mouth daily.  . furosemide (LASIX) 20 MG tablet Take 20 mg by mouth daily as needed.   . metoprolol tartrate (LOPRESSOR) 25 MG tablet Take 25 mg by mouth 2 (two) times daily. 50mg  in the morning 25 mg at night  . ondansetron (ZOFRAN) 4 MG tablet Take 1 tablet (4 mg total) by mouth 2 (two) times daily as needed for nausea or vomiting.  . pantoprazole (PROTONIX) 40 MG tablet Take 1 tablet by mouth  daily  . SUMAtriptan (IMITREX) 50 MG tablet Take 1 tablet by mouth  every 2 hours as needed for migraine(s)  .  SYNTHROID 100 MCG tablet Take 1 tablet by mouth  daily before breakfast  . traZODone (DESYREL) 50 MG tablet 50 mg. Takes 1/2 tablet as needed at bedtime  . fluconazole (DIFLUCAN) 150 MG tablet Take 1 tablet (150 mg total) by mouth once. (Patient not taking: Reported on 01/30/2016)  . HYDROcodone-homatropine (HYCODAN) 5-1.5 MG/5ML syrup Take 5 mLs by mouth every 8 (eight) hours as needed for cough. (Patient not taking: Reported on 01/30/2016)  . [DISCONTINUED] sertraline (ZOLOFT) 100 MG tablet TAKE ONE TABLET (100MG ) BY MOUTH ONCE DAILY FOR 7 DAYS. THEN, TAKE 2 TABLETS (200MG ) DAILY   No facility-administered encounter medications on file as of 01/30/2016.     Review of Systems  Constitutional: Positive for fatigue. Negative for unexpected weight change.  HENT: Negative for congestion and sinus pressure.   Respiratory:  Negative for cough and chest tightness.        Breathing overall stable.    Cardiovascular: Positive for leg swelling. Negative for chest pain and palpitations.  Gastrointestinal: Negative for abdominal pain and vomiting.       Still with multiple loose stools.    Genitourinary: Negative for difficulty urinating and dysuria.  Musculoskeletal: Negative for back pain and myalgias.  Skin: Negative for color change and rash.  Neurological: Negative for dizziness, light-headedness and headaches.  Psychiatric/Behavioral: Negative for agitation and dysphoric mood.       Objective:    Physical Exam  Constitutional: She appears well-developed and well-nourished. No distress.  HENT:  Nose: Nose normal.  Mouth/Throat: Oropharynx is clear and moist.  Neck: Neck supple. No thyromegaly present.  Cardiovascular: Normal rate and regular rhythm.   2/6 systolic murmur.   Pulmonary/Chest: Breath sounds normal. No respiratory distress. She has no wheezes.  Abdominal: Soft. Bowel sounds are normal. There is no tenderness.  Musculoskeletal: She exhibits no tenderness.  DP pulses palpable and equal bilaterally.  No increased edema noted on exam today.    Lymphadenopathy:    She has no cervical adenopathy.  Skin: No rash noted. No erythema.    BP (!) 90/58   Pulse 61   Temp 98.2 F (36.8 C) (Oral)   Ht 5\' 7"  (1.702 m)   Wt 142 lb 9.6 oz (64.7 kg)   LMP 10/08/2011   SpO2 98%   BMI 22.33 kg/m  Wt Readings from Last 3 Encounters:  01/30/16 142 lb 9.6 oz (64.7 kg)  10/23/15 143 lb 4 oz (65 kg)  07/30/15 140 lb 4 oz (63.6 kg)     Lab Results  Component Value Date   WBC 4.3 09/10/2015   HGB 11.4 (A) 01/25/2015   HCT 34.9 09/10/2015   PLT 196 09/10/2015   GLUCOSE 71 12/04/2015   CHOL 161 12/12/2014   TRIG 44 12/12/2014   HDL 75 (A) 12/12/2014   LDLCALC 77 12/12/2014   ALT 24 09/10/2015   AST 30 09/10/2015   NA 141 12/04/2015   K 3.4 (L) 12/04/2015   CL 103 12/04/2015   CREATININE  0.74 12/04/2015   BUN 14 12/04/2015   CO2 23 12/04/2015   TSH 5.100 (H) 12/04/2015   INR 1.0 09/26/2014   HGBA1C 4.9 12/12/2014    Mm Digital Screening Bilateral  Result Date: 09/09/2015 CLINICAL DATA:  Screening. EXAM: DIGITAL SCREENING BILATERAL MAMMOGRAM WITH CAD COMPARISON:  Previous exam(s). ACR Breast Density Category c: The breast tissue is heterogeneously dense, which may obscure small masses. FINDINGS: There are no findings suspicious for malignancy. Images were processed with CAD. IMPRESSION: No  mammographic evidence of malignancy. A result letter of this screening mammogram will be mailed directly to the patient. RECOMMENDATION: Screening mammogram in one year. (Code:SM-B-01Y) BI-RADS CATEGORY  1: Negative. Electronically Signed   By: Claudie Revering M.D.   On: 09/09/2015 12:13       Assessment & Plan:   Problem List Items Addressed This Visit    Anemia    Some fatigue.  Recheck cbc.       Relevant Orders   CBC with Differential/Platelet   Ferritin   Vitamin B12   ANXIETY DEPRESSION    Followed by Dr Toy Care.  Stable.       Relevant Medications   desvenlafaxine (PRISTIQ) 100 MG 24 hr tablet   Essential hypertension    Blood pressure actually now runs on the low side.  She has to take hctz or will have swelling.  Follow pressures.  Follow metabolic panel.       Hypertrophic obstructive cardiomyopathy ([place])    Followed by cardiology.  Overall stable.  Has f/u planned in 02/2016 with Dr Mina Marble.  Will have ECHO.        Relevant Orders   Basic metabolic panel   Hypothyroidism - Primary    On thyroid replacement.  Follow tsh.       Relevant Orders   TSH   IBS    Persistent loose stools.  Has seen GI previously.  Had previous extensive w/up.  Discussed with her today.  Since has been five years and since persistent problems, she would like to be referred to a different gastroenterologist for evaluation.        Lichen sclerosus    Saw gyn.  Using clobetasol.  Follow.          Migraine    Stable.  Follow.       Relevant Medications   desvenlafaxine (PRISTIQ) 100 MG 24 hr tablet   Occlusive thrombus    Previously had occlusive thrombus of internal jugular and non occlusive thrombus surrounding pacemaker leads in the left subclavian and left brachiocephalic veins into the left axillary vein.  Saw hematology.  Off eliquis.  Follow.         Other Visit Diagnoses   None.      Einar Pheasant, MD

## 2016-01-30 NOTE — Progress Notes (Signed)
Pre visit review using our clinic review tool, if applicable. No additional management support is needed unless otherwise documented below in the visit note. 

## 2016-02-01 ENCOUNTER — Encounter: Payer: Self-pay | Admitting: Internal Medicine

## 2016-02-01 DIAGNOSIS — L900 Lichen sclerosus et atrophicus: Secondary | ICD-10-CM | POA: Insufficient documentation

## 2016-02-01 NOTE — Assessment & Plan Note (Signed)
On thyroid replacement.  Follow tsh.  

## 2016-02-01 NOTE — Assessment & Plan Note (Signed)
Blood pressure actually now runs on the low side.  She has to take hctz or will have swelling.  Follow pressures.  Follow metabolic panel.

## 2016-02-01 NOTE — Assessment & Plan Note (Signed)
Followed by Dr Kaur.  Stable.  

## 2016-02-01 NOTE — Assessment & Plan Note (Signed)
Previously had occlusive thrombus of internal jugular and non occlusive thrombus surrounding pacemaker leads in the left subclavian and left brachiocephalic veins into the left axillary vein.  Saw hematology.  Off eliquis.  Follow.

## 2016-02-01 NOTE — Assessment & Plan Note (Signed)
Persistent loose stools.  Has seen GI previously.  Had previous extensive w/up.  Discussed with her today.  Since has been five years and since persistent problems, she would like to be referred to a different gastroenterologist for evaluation.

## 2016-02-01 NOTE — Assessment & Plan Note (Signed)
Saw gyn.  Using clobetasol.  Follow.

## 2016-02-01 NOTE — Assessment & Plan Note (Signed)
Some fatigue.  Recheck cbc.

## 2016-02-01 NOTE — Assessment & Plan Note (Signed)
Stable.  Follow.   

## 2016-02-01 NOTE — Assessment & Plan Note (Addendum)
Followed by cardiology.  Overall stable.  Has f/u planned in 02/2016 with Dr Mina Marble.  Will have ECHO.

## 2016-02-03 LAB — CBC WITH DIFFERENTIAL/PLATELET
BASOS: 0 %
Basophils Absolute: 0 10*3/uL (ref 0.0–0.2)
EOS (ABSOLUTE): 0 10*3/uL (ref 0.0–0.4)
EOS: 0 %
HEMATOCRIT: 33.8 % — AB (ref 34.0–46.6)
Hemoglobin: 10.9 g/dL — ABNORMAL LOW (ref 11.1–15.9)
Immature Grans (Abs): 0 10*3/uL (ref 0.0–0.1)
Immature Granulocytes: 0 %
LYMPHS ABS: 1.2 10*3/uL (ref 0.7–3.1)
Lymphs: 39 %
MCH: 25.3 pg — ABNORMAL LOW (ref 26.6–33.0)
MCHC: 32.2 g/dL (ref 31.5–35.7)
MCV: 79 fL (ref 79–97)
MONOS ABS: 0.3 10*3/uL (ref 0.1–0.9)
Monocytes: 10 %
NEUTROS ABS: 1.5 10*3/uL (ref 1.4–7.0)
Neutrophils: 51 %
Platelets: 189 10*3/uL (ref 150–379)
RBC: 4.3 x10E6/uL (ref 3.77–5.28)
RDW: 14.3 % (ref 12.3–15.4)
WBC: 3 10*3/uL — AB (ref 3.4–10.8)

## 2016-02-03 LAB — BASIC METABOLIC PANEL
BUN / CREAT RATIO: 15 (ref 9–23)
BUN: 12 mg/dL (ref 6–24)
CO2: 27 mmol/L (ref 18–29)
CREATININE: 0.81 mg/dL (ref 0.57–1.00)
Calcium: 8.7 mg/dL (ref 8.7–10.2)
Chloride: 102 mmol/L (ref 96–106)
GFR calc Af Amer: 104 mL/min/{1.73_m2} (ref >59)
GFR, EST NON AFRICAN AMERICAN: 90 mL/min/{1.73_m2} (ref >59)
Glucose: 93 mg/dL (ref 65–99)
Potassium: 4.3 mmol/L (ref 3.5–5.2)
SODIUM: 141 mmol/L (ref 134–144)

## 2016-02-03 LAB — FERRITIN: Ferritin: 10 ng/mL — ABNORMAL LOW (ref 15–150)

## 2016-02-03 LAB — TSH: TSH: 1.16 u[IU]/mL (ref 0.450–4.500)

## 2016-02-03 LAB — VITAMIN B12: VITAMIN B 12: 246 pg/mL (ref 211–946)

## 2016-02-04 ENCOUNTER — Telehealth: Payer: Self-pay

## 2016-02-04 ENCOUNTER — Telehealth: Payer: Self-pay | Admitting: Internal Medicine

## 2016-02-04 DIAGNOSIS — D509 Iron deficiency anemia, unspecified: Secondary | ICD-10-CM

## 2016-02-04 MED ORDER — INTEGRA 62.5-62.5-40-3 MG PO CAPS: ORAL_CAPSULE | ORAL | 3 refills | Status: DC

## 2016-02-04 MED ORDER — VITAMIN B-12 1000 MCG PO TABS: 1000.0000 ug | ORAL_TABLET | Freq: Every day | ORAL | 3 refills | Status: DC

## 2016-02-04 NOTE — Telephone Encounter (Signed)
-----   Message from Einar Pheasant, MD sent at 02/04/2016  5:52 AM EDT ----- Please call and notify pt that her hgb is decreased - slightly more than previous check.  Iron stores are low.  See if she is taking any iron.  If no, then would like to start integra one q day.  Let us know if problems tolerating the iron.  Also, her B12 level is within normal limits, but low normal.  Have her start B12 1064mcg q day.  Thyroid test, potassium and kidney function tests are wnl.  I would like to recheck cbc and ferritin in the next few weeks.  I will order the labs.  She goes by [facility] draw station to have drawn.

## 2016-02-04 NOTE — Telephone Encounter (Signed)
Orders placed for labs to be drawn at [facility] 

## 2016-02-04 NOTE — Telephone Encounter (Signed)
Patient notified and rx sent to pharmacy. 

## 2016-02-20 IMAGING — MG MM DIGITAL SCREENING BILAT W/ CAD
6 series · 6 of 6 positions shown · non-contrast
Comparison: None.

CLINICAL DATA: Screening.

EXAM:
DIGITAL SCREENING BILATERAL MAMMOGRAM WITH CAD

[L MLO (1 of 3)]
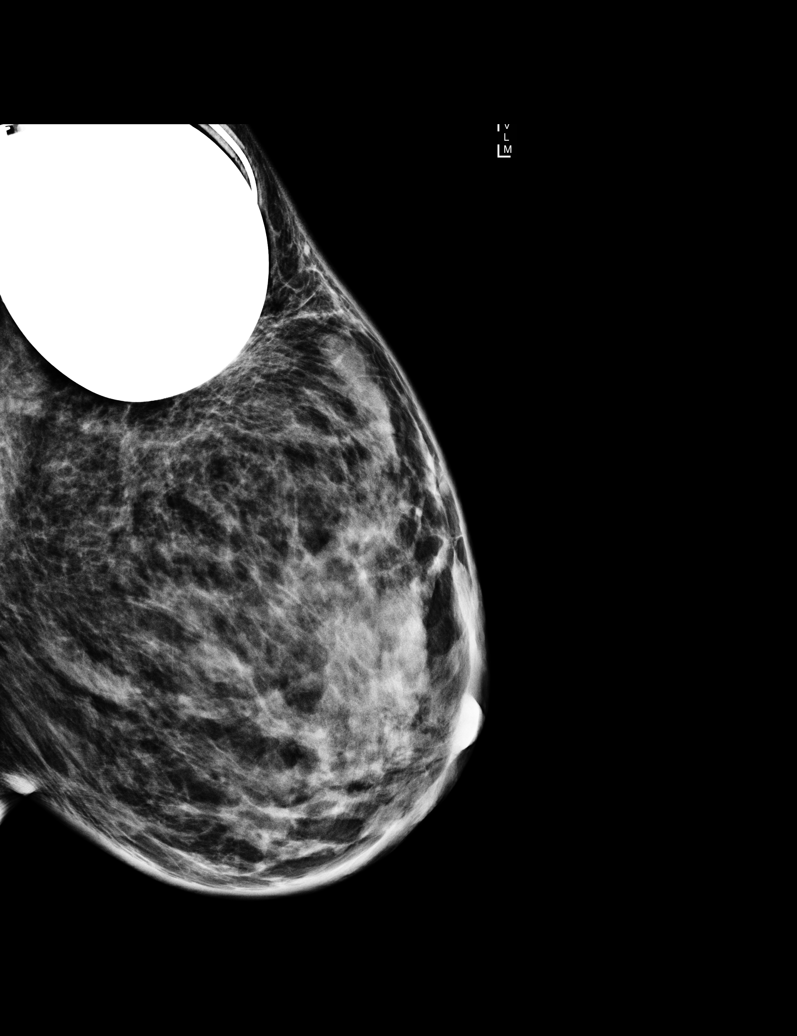

[L MLO (2 of 3)]
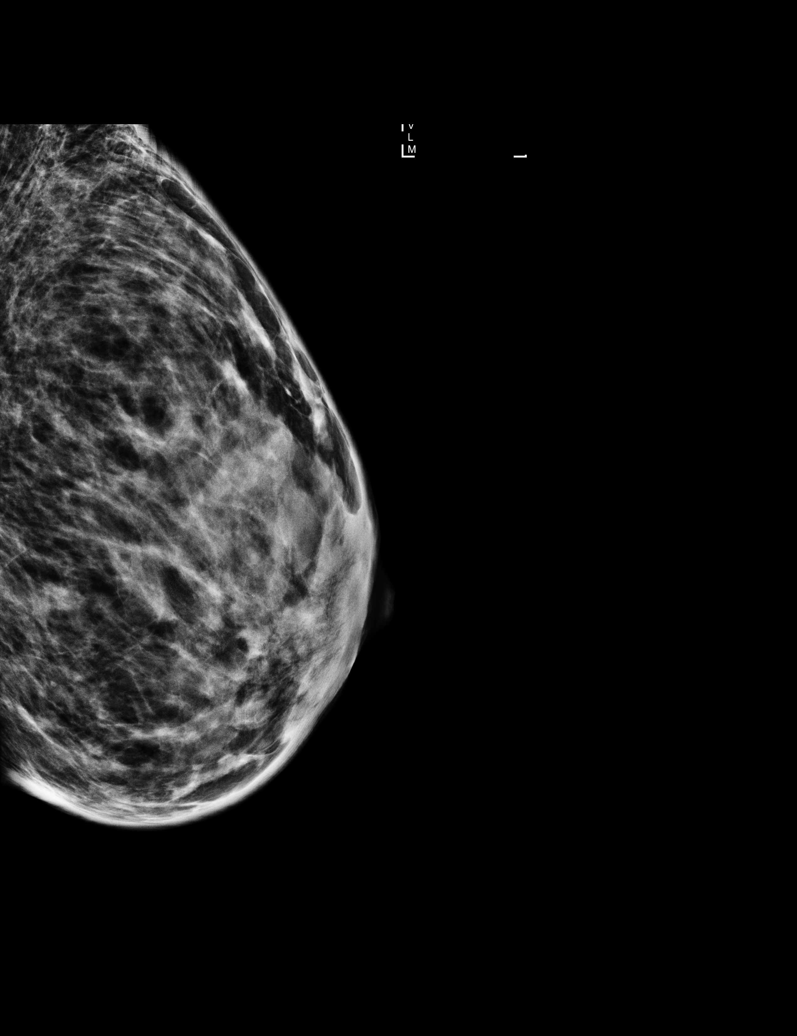

[L CC]
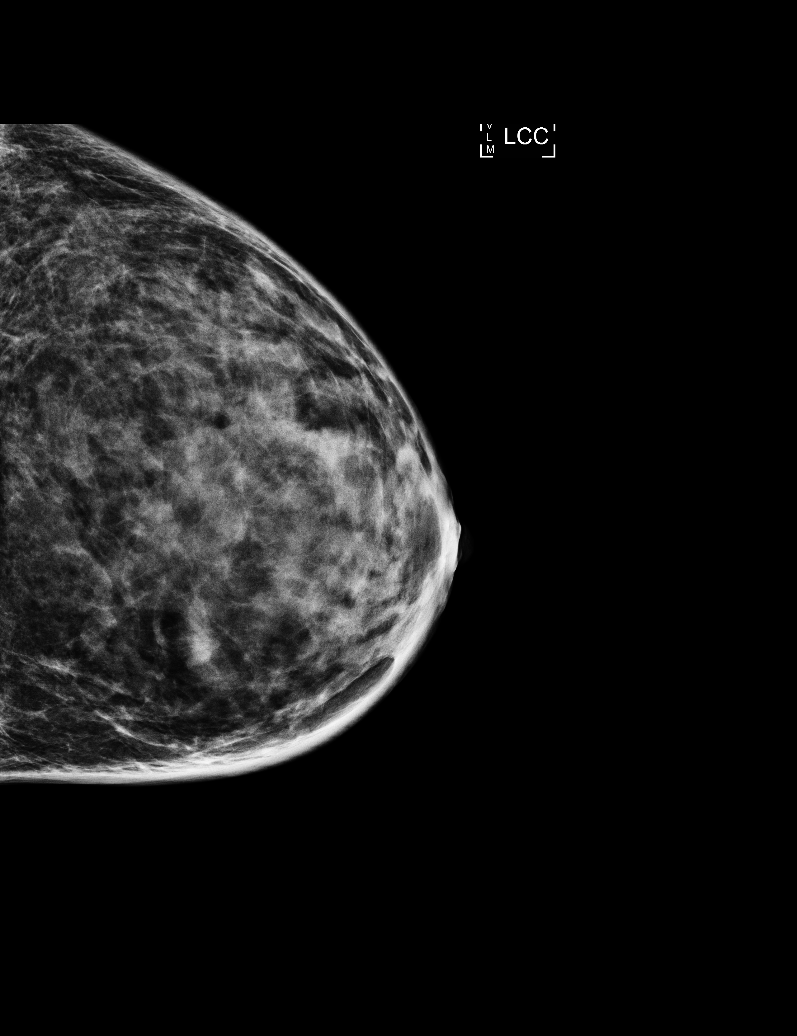

[L MLO (3 of 3)]
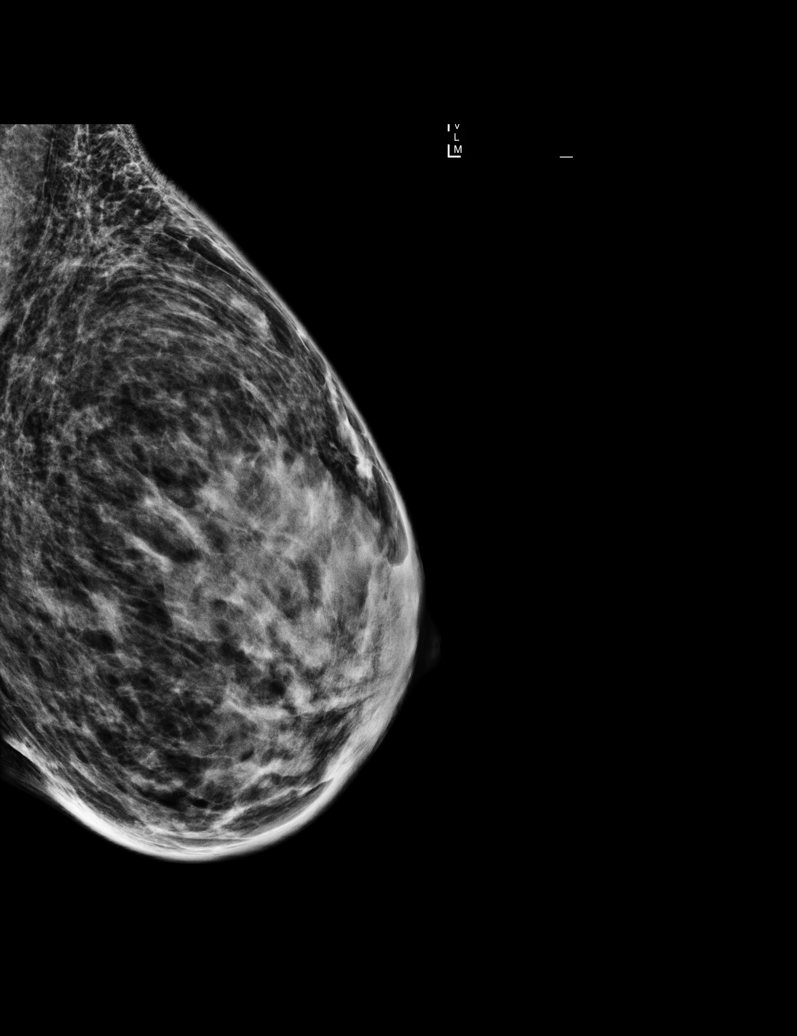

[R MLO]
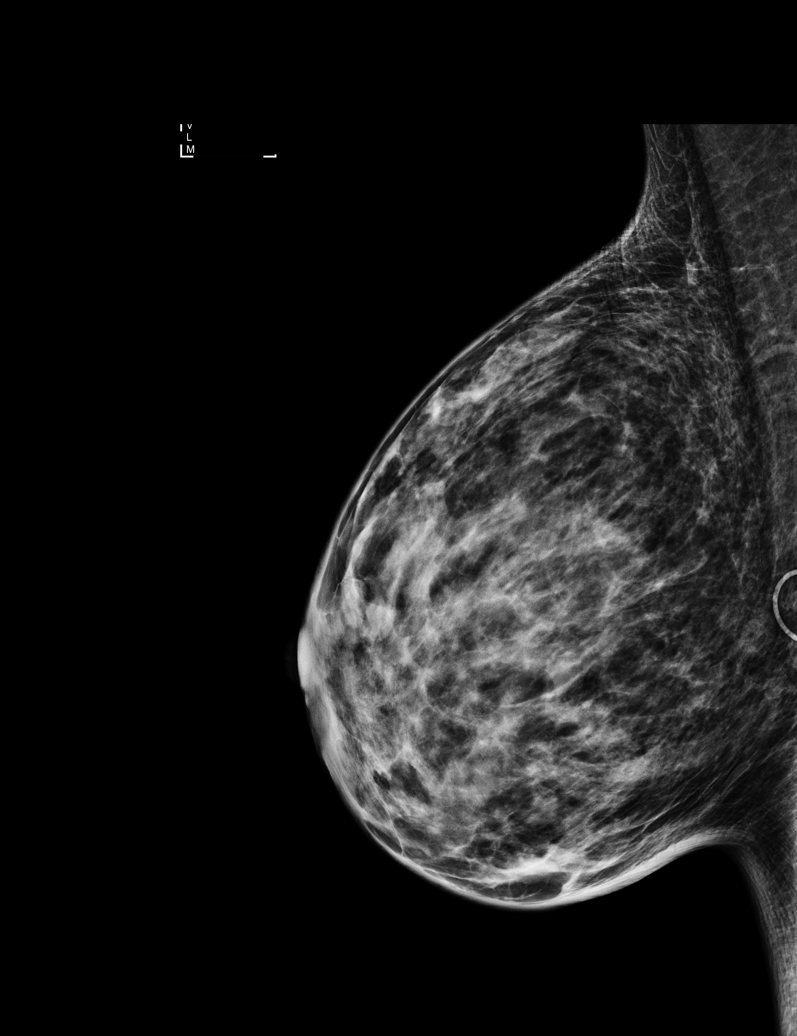

[R CC]
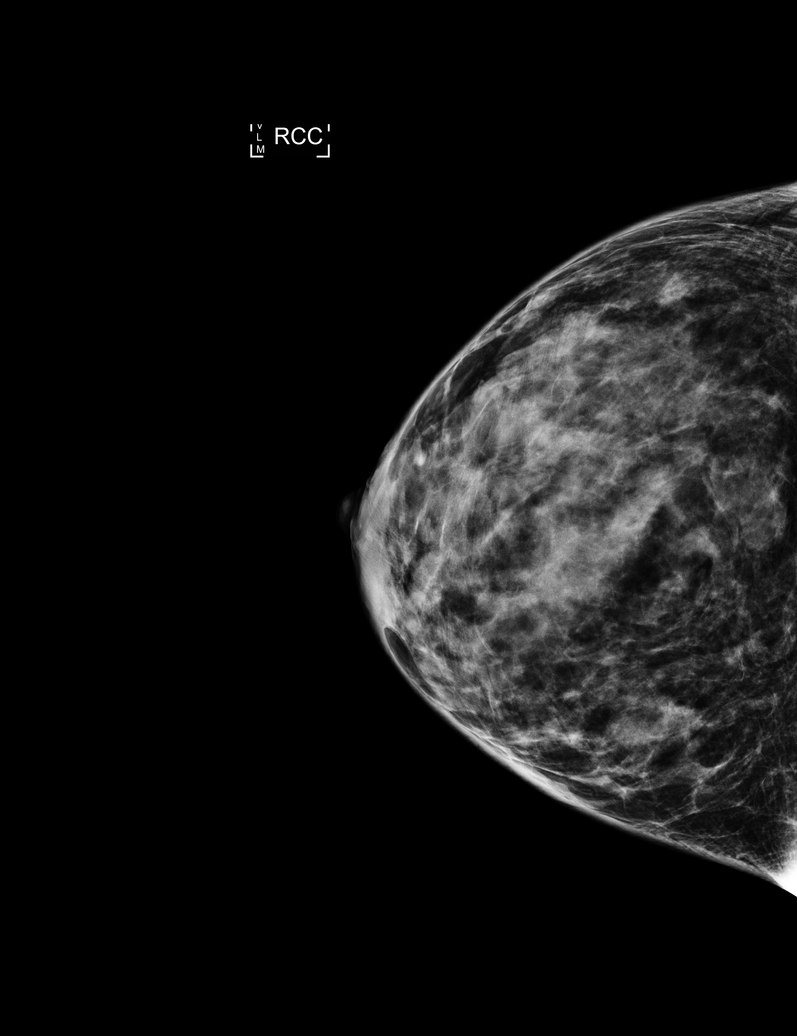

[6 of 6 positions shown; findings below may reference images not displayed]

ACR Breast Density Category d: The breast tissue is extremely dense,
which lowers the sensitivity of mammography.
FINDINGS: There are no findings suspicious for malignancy. Images were
processed with CAD.
IMPRESSION: No mammographic evidence of malignancy. A result letter of this
screening mammogram will be mailed directly to the patient.

RECOMMENDATION:
Screening mammogram in one year. (Code:8A-L-C49)

BI-RADS CATEGORY  1: Negative.

## 2016-03-02 ENCOUNTER — Encounter: Payer: Self-pay | Admitting: Internal Medicine

## 2016-03-05 LAB — CBC WITH DIFFERENTIAL/PLATELET
BASOS ABS: 0 10*3/uL (ref 0.0–0.2)
Basos: 0 %
EOS (ABSOLUTE): 0 10*3/uL (ref 0.0–0.4)
Eos: 0 %
HEMOGLOBIN: 10.3 g/dL — AB (ref 11.1–15.9)
Hematocrit: 31 % — ABNORMAL LOW (ref 34.0–46.6)
IMMATURE GRANS (ABS): 0 10*3/uL (ref 0.0–0.1)
Immature Granulocytes: 0 %
LYMPHS: 24 %
Lymphocytes Absolute: 0.7 10*3/uL (ref 0.7–3.1)
MCH: 25.1 pg — ABNORMAL LOW (ref 26.6–33.0)
MCHC: 33.2 g/dL (ref 31.5–35.7)
MCV: 75 fL — ABNORMAL LOW (ref 79–97)
MONOCYTES: 11 %
Monocytes Absolute: 0.3 10*3/uL (ref 0.1–0.9)
Neutrophils Absolute: 1.9 10*3/uL (ref 1.4–7.0)
Neutrophils: 65 %
Platelets: 157 10*3/uL (ref 150–379)
RBC: 4.11 x10E6/uL (ref 3.77–5.28)
RDW: 13.6 % (ref 12.3–15.4)
WBC: 2.8 10*3/uL — AB (ref 3.4–10.8)

## 2016-03-05 LAB — FERRITIN: FERRITIN: 11 ng/mL — AB (ref 15–150)

## 2016-03-07 ENCOUNTER — Encounter: Payer: Self-pay | Admitting: Internal Medicine

## 2016-03-07 DIAGNOSIS — D509 Iron deficiency anemia, unspecified: Secondary | ICD-10-CM

## 2016-03-09 ENCOUNTER — Encounter: Payer: Self-pay | Admitting: Gastroenterology

## 2016-03-09 ENCOUNTER — Other Ambulatory Visit: Payer: Self-pay

## 2016-03-09 ENCOUNTER — Ambulatory Visit (INDEPENDENT_AMBULATORY_CARE_PROVIDER_SITE_OTHER): Payer: 59 | Admitting: Gastroenterology

## 2016-03-09 VITALS — BP 81/48 | HR 78 | Temp 98.2°F | Ht 67.0 in | Wt 145.0 lb

## 2016-03-09 DIAGNOSIS — R197 Diarrhea, unspecified: Secondary | ICD-10-CM

## 2016-03-09 MED ORDER — ALOSETRON HCL 0.5 MG PO TABS: 0.5000 mg | ORAL_TABLET | Freq: Two times a day (BID) | ORAL | 0 refills | Status: DC

## 2016-03-09 NOTE — Patient Instructions (Signed)
You have been given an order for labs today. Please go to [facility] to have these done. You have also been given a prescription that has been sent to [facility], [place]. Please contact our office with any questions.

## 2016-03-09 NOTE — Telephone Encounter (Signed)
Order placed for hematology referral.  

## 2016-03-09 NOTE — Progress Notes (Signed)
Gastroenterology Consultation  Referring Provider:     Einar Pheasant, MD Primary Care Physician:  Einar Pheasant, MD Primary Gastroenterologist:  Dr. Allen Norris     Reason for Consultation:     Diarrhea        HPI:   Carol Mahoney is a 40 y.o. y/o female referred for consultation & management of Diarrhea by Dr. Einar Pheasant, MD.  This patient comes today with a long history of diarrhea. The patient states that she has been worked up multiple times for the diarrhea. The patient denies any unexplained weight loss but states that because the diarrhea she is unable to gain weight. The patient has up to 20 bowel movements a day. The patient also reports that she has soiled herself while sleeping. There is no report of any black stools but she does have some episodes of small amount of red blood. The patient had biopsies of her colon back in 2008 that did not show any signs of colitis. The patient also had an upper endoscopy with a hyperplastic polyp in the stomach with reflux changes in the esophagus. There is no biopsies reported of the small bowel for celiac sprue or the terminal ileum for Crohn's disease. The patient is now here to see me for her chronic diarrhea. The patient has had surgery for her reflux.  Past Medical History:  Diagnosis Date  . Anemia   . Anxiety   . Depression   . GERD (gastroesophageal reflux disease)   . H/O hiatal hernia   . Hypertension   . Hypertr obst cardiomyop    mitral regurgitation, LAE - followed at [facility]  . Hypothyroidism   . Migraine headache   . PONV (postoperative nausea and vomiting)     Past Surgical History:  Procedure Laterality Date  . BUNIONECTOMY    . CHOLECYSTECTOMY    . HIATAL HERNIA REPAIR    . IMPLANTABLE CARDIOVERTER DEFIBRILLATOR REVISION  05/21/14   [facility]  . PARTIAL GASTRECTOMY    . TONSILLECTOMY      Prior to Admission medications   Medication Sig Start Date End Date Taking? Authorizing Provider  acetaminophen (TYLENOL) 500  MG tablet Take 500 mg by mouth every 6 (six) hours as needed. For headache   Yes Historical Provider, MD  ALPRAZolam (XANAX) 0.5 MG tablet 0.5 mg 3 (three) times daily as needed.  05/02/12  Yes Historical Provider, MD  clobetasol ointment (TEMOVATE) [id number] % APPLY 1 APPLICATION EXTERNALLY AT BEDTIME 12/26/15  Yes Historical Provider, MD  cyclobenzaprine (FLEXERIL) 10 MG tablet Take 1 tablet (10 mg total) by mouth daily as needed for muscle spasms. 05/27/14  Yes Einar Pheasant, MD  desvenlafaxine (PRISTIQ) 100 MG 24 hr tablet Take 100 mg by mouth daily.   Yes Historical Provider, MD  EPINEPHrine (EPIPEN 2-PAK) 0.3 mg/0.3 mL IJ SOAJ injection USE AS DIRECTED ON PACKAGE 01/22/15  Yes Historical Provider, MD  furosemide (LASIX) 20 MG tablet Take 20 mg by mouth daily as needed.    Yes Historical Provider, MD  metoprolol tartrate (LOPRESSOR) 25 MG tablet Take 25 mg by mouth 2 (two) times daily. 50mg  in the morning 25 mg at night   Yes Historical Provider, MD  ondansetron (ZOFRAN) 4 MG tablet Take 1 tablet (4 mg total) by mouth 2 (two) times daily as needed for nausea or vomiting. 09/21/13  Yes Einar Pheasant, MD  ondansetron (ZOFRAN-ODT) 8 MG disintegrating tablet Take by mouth.   Yes Historical Provider, MD  pantoprazole (PROTONIX) 40 MG  tablet Take 1 tablet by mouth  daily 11/24/15  Yes Einar Pheasant, MD  SUMAtriptan (IMITREX) 50 MG tablet Take 1 tablet by mouth  every 2 hours as needed for migraine(s) 03/17/15  Yes Einar Pheasant, MD  SYNTHROID 100 MCG tablet Take 1 tablet by mouth  daily before breakfast 07/23/15  Yes Einar Pheasant, MD  traZODone (DESYREL) 50 MG tablet 50 mg. Takes 1/2 tablet as needed at bedtime   Yes Historical Provider, MD  vitamin B-12 (CYANOCOBALAMIN) 1000 MCG tablet Take 1 tablet (1,000 mcg total) by mouth daily. 02/04/16  Yes Einar Pheasant, MD  alosetron (LOTRONEX) 0.5 MG tablet Take 1 tablet (0.5 mg total) by mouth 2 (two) times daily. 03/09/16   Lucilla Lame, MD  buPROPion (WELLBUTRIN XL)  150 MG 24 hr tablet  01/28/16   Historical Provider, MD  buPROPion (WELLBUTRIN XL) 300 MG 24 hr tablet Take 150 mg by mouth every other day.  10/21/15   Historical Provider, MD  Fe Fum-FePoly-Vit C-Vit B3 (INTEGRA) 62.5-62.5-40-3 MG CAPS Take 1 Cap once daily Patient not taking: Reported on 03/09/2016 02/04/16   Einar Pheasant, MD  fluconazole (DIFLUCAN) 150 MG tablet Take 1 tablet (150 mg total) by mouth once. Patient not taking: Reported on 03/09/2016 12/01/15   Einar Pheasant, MD  HYDROcodone-homatropine [facility]) 5-1.5 MG/5ML syrup Take 5 mLs by mouth every 8 (eight) hours as needed for cough. Patient not taking: Reported on 03/09/2016 07/28/15   Jackolyn Confer, MD    Family History  Problem Relation Age of Onset  . Anesthesia problems Father   . Hypertension Mother   . Hashimoto's thyroiditis Maternal Grandmother   . Hypertension Maternal Grandmother   . Breast cancer Neg Hx   . Colon cancer Neg Hx      Social History  Substance Use Topics  . Smoking status: Never Smoker  . Smokeless tobacco: Never Used  . Alcohol use No    Allergies as of 03/09/2016 - Review Complete 03/09/2016  Allergen Reaction Noted  . Shellfish-derived products Swelling 09/04/2015  . Penicillins Hives and Rash 10/14/2011  . Chloraprep one step [chlorhexidine gluconate] Itching 10/14/2011  . Vancomycin Itching and Dermatitis 10/20/2011  . Carafate [sucralfate] Rash 10/14/2011  . Iodine Rash 10/20/2011  . Sulfa antibiotics Hives and Rash 10/14/2011    Review of Systems:    All systems reviewed and negative except where noted in HPI.   Physical Exam:  BP (!) 81/48   Pulse 78   Temp 98.2 F (36.8 C) (Oral)   Ht 5\' 7"  (1.702 m)   Wt 145 lb (65.8 kg)   LMP 10/08/2011   BMI 22.71 kg/m  Patient's last menstrual period was 10/08/2011. Psych:  Alert and cooperative. Normal mood and affect. General:   Alert,  Well-developed, well-nourished, pleasant and cooperative in NAD Head:  Normocephalic and  atraumatic. Eyes:  Sclera clear, no icterus.   Conjunctiva pink. Ears:  Normal auditory acuity. Nose:  No deformity, discharge, or lesions. Mouth:  No deformity or lesions,oropharynx pink & moist. Neck:  Supple; no masses or thyromegaly. Lungs:  Respirations even and unlabored.  Clear throughout to auscultation.   No wheezes, crackles, or rhonchi. No acute distress. Heart:  Regular rate and rhythm; Systolic murmur, clicks, rubs, or gallops. Abdomen:  Normal bowel sounds.  No bruits.  Soft, non-tender and non-distended without masses, hepatosplenomegaly or hernias noted.  No guarding or rebound tenderness.  Negative Carnett sign.   Rectal:  Deferred.  Msk:  Symmetrical without gross deformities.  Good, equal movement & strength bilaterally. Pulses:  Normal pulses noted. Extremities:  No clubbing or edema.  No cyanosis. Neurologic:  Alert and oriented x3;  grossly normal neurologically. Skin:  Intact without significant lesions or rashes.  No jaundice. There is a midline scar in the chest consistent with her open-heart surgery Lymph Nodes:  No significant cervical adenopathy. Psych:  Alert and cooperative. Normal mood and affect.  Imaging Studies: No results found.  Assessment and Plan:   TEKELA PARRILLI is a 40 y.o. y/o female with chronic diarrhea. The patient likely has irritable bowel syndrome although the patient does report that the stools have all her up at night and she has not been able to gain weight. The patient will have her stool sent off for fecal fat for possible pancreatic insufficiency versus bacterial overgrowth. The patient will also have her blood sent off for a celiac sprue panel. The patient has been told that we would like to start her on Lotronex 0.5 mg twice a day. The patient has been told about the risks of the medication and to stop the medication if she becomes constipated. The patient has been explained the plan and agrees with it.   Note: This dictation was  prepared with Dragon dictation along with smaller phrase technology. Any transcriptional errors that result from this process are unintentional.

## 2016-03-15 ENCOUNTER — Other Ambulatory Visit: Payer: Self-pay | Admitting: Gastroenterology

## 2016-03-16 ENCOUNTER — Encounter: Payer: Self-pay | Admitting: Internal Medicine

## 2016-03-16 MED ORDER — LEVOTHYROXINE SODIUM 100 MCG PO TABS: ORAL_TABLET | ORAL | 1 refills | Status: DC

## 2016-03-16 NOTE — Telephone Encounter (Signed)
rx sent in for levothyroxine #30 with one refill locally and 90 days with one refill to [facility].  Pt requested to have generic.

## 2016-03-18 ENCOUNTER — Telehealth: Payer: Self-pay

## 2016-03-18 LAB — GLIA (IGA/G) + TTG IGA
ANTIGLIADIN ABS, IGA: 3 U (ref 0–19)
GLIADIN IGG: 2 U (ref 0–19)
Transglutaminase IgA: <2 U/mL (ref 0–3)

## 2016-03-18 NOTE — Telephone Encounter (Signed)
LVM for pt to return my call.

## 2016-03-18 NOTE — Telephone Encounter (Signed)
-----   Message from Lucilla Lame, MD sent at 03/18/2016  7:04 AM EDT ----- Let the patient know the sprue panel was negative.

## 2016-03-19 NOTE — Telephone Encounter (Signed)
Left vm again with results. Advised to contact our office with any questions.

## 2016-03-24 ENCOUNTER — Inpatient Hospital Stay: Payer: Managed Care, Other (non HMO) | Admitting: Oncology

## 2016-03-24 NOTE — Progress Notes (Deleted)
[place]  [phone] [phone]) [phone]  ID: Carol Mahoney OB: November 16, 1975  MR#: [id number]  [id number]  Patient Care Team: Einar Pheasant, MD as PCP - General (Unknown Physician Specialty)  CHIEF COMPLAINT:  Iron deficiency anemia refractory to oral iron therapy.  INTERVAL HISTORY: ***  REVIEW OF SYSTEMS:   ROS  As per HPI. Otherwise, a complete review of systems is negative.  PAST MEDICAL HISTORY: Past Medical History:  Diagnosis Date  . Anemia   . Anxiety   . Depression   . GERD (gastroesophageal reflux disease)   . H/O hiatal hernia   . Hypertension   . Hypertr obst cardiomyop    mitral regurgitation, LAE - followed at [facility]  . Hypothyroidism   . Migraine headache   . PONV (postoperative nausea and vomiting)     PAST SURGICAL HISTORY: Past Surgical History:  Procedure Laterality Date  . BUNIONECTOMY    . CHOLECYSTECTOMY    . HIATAL HERNIA REPAIR    . IMPLANTABLE CARDIOVERTER DEFIBRILLATOR REVISION  05/21/14   [facility]  . PARTIAL GASTRECTOMY    . TONSILLECTOMY      FAMILY HISTORY: Family History  Problem Relation Age of Onset  . Anesthesia problems Father   . Hypertension Mother   . Hashimoto's thyroiditis Maternal Grandmother   . Hypertension Maternal Grandmother   . Breast cancer Neg Hx   . Colon cancer Neg Hx     ADVANCED DIRECTIVES (Y/N):  N  HEALTH MAINTENANCE: Social History  Substance Use Topics  . Smoking status: Never Smoker  . Smokeless tobacco: Never Used  . Alcohol use No     Colonoscopy:  PAP:  Bone density:  Lipid panel:  Allergies  Allergen Reactions  . Shellfish-Derived Products Swelling  . Penicillins Hives and Rash  . Chloraprep One Step [Chlorhexidine Gluconate] Itching  . Vancomycin Itching and Dermatitis  . Carafate [Sucralfate] Rash  . Iodine Rash  . Sulfa Antibiotics Hives and Rash    Current Outpatient Prescriptions  Medication Sig Dispense Refill  . acetaminophen  (TYLENOL) 500 MG tablet Take 500 mg by mouth every 6 (six) hours as needed. For headache    . alosetron (LOTRONEX) 0.5 MG tablet Take 1 tablet (0.5 mg total) by mouth 2 (two) times daily. 60 tablet 0  . ALPRAZolam (XANAX) 0.5 MG tablet 0.5 mg 3 (three) times daily as needed.     Marland Kitchen buPROPion (WELLBUTRIN XL) 150 MG 24 hr tablet     . buPROPion (WELLBUTRIN XL) 300 MG 24 hr tablet Take 150 mg by mouth every other day.     . clobetasol ointment (TEMOVATE) [id number] % APPLY 1 APPLICATION EXTERNALLY AT BEDTIME  2  . cyclobenzaprine (FLEXERIL) 10 MG tablet Take 1 tablet (10 mg total) by mouth daily as needed for muscle spasms. 30 tablet 0  . desvenlafaxine (PRISTIQ) 100 MG 24 hr tablet Take 100 mg by mouth daily.    Marland Kitchen EPINEPHrine (EPIPEN 2-PAK) 0.3 mg/0.3 mL IJ SOAJ injection USE AS DIRECTED ON PACKAGE    . Fe Fum-FePoly-Vit C-Vit B3 (INTEGRA) 62.5-62.5-40-3 MG CAPS Take 1 Cap once daily (Patient not taking: Reported on 03/09/2016) 30 capsule 3  . fluconazole (DIFLUCAN) 150 MG tablet Take 1 tablet (150 mg total) by mouth once. (Patient not taking: Reported on 03/09/2016) 1 tablet 0  . furosemide (LASIX) 20 MG tablet Take 20 mg by mouth daily as needed.     Marland Kitchen HYDROcodone-homatropine (HYCODAN) 5-1.5 MG/5ML syrup Take 5 mLs by mouth every 8 (  eight) hours as needed for cough. (Patient not taking: Reported on 03/09/2016) 120 mL 0  . levothyroxine (SYNTHROID) 100 MCG tablet Take 1 tablet by mouth  daily before breakfast 90 tablet 1  . metoprolol tartrate (LOPRESSOR) 25 MG tablet Take 25 mg by mouth 2 (two) times daily. 50mg  in the morning 25 mg at night    . ondansetron (ZOFRAN) 4 MG tablet Take 1 tablet (4 mg total) by mouth 2 (two) times daily as needed for nausea or vomiting. 20 tablet 0  . ondansetron (ZOFRAN-ODT) 8 MG disintegrating tablet Take by mouth.    . pantoprazole (PROTONIX) 40 MG tablet Take 1 tablet by mouth  daily 90 tablet 3  . SUMAtriptan (IMITREX) 50 MG tablet Take 1 tablet by mouth  every 2 hours  as needed for migraine(s) 54 tablet 2  . traZODone (DESYREL) 50 MG tablet 50 mg. Takes 1/2 tablet as needed at bedtime    . vitamin B-12 (CYANOCOBALAMIN) 1000 MCG tablet Take 1 tablet (1,000 mcg total) by mouth daily. 30 tablet 3   No current facility-administered medications for this visit.     OBJECTIVE: There were no vitals filed for this visit.   There is no height or weight on file to calculate BMI.    ECOG FS:{CHL ONC [id number]  General: Well-developed, well-nourished, no acute distress. Eyes: Pink conjunctiva, anicteric sclera. HEENT: Normocephalic, moist mucous membranes, clear oropharnyx. Lungs: Clear to auscultation bilaterally. Heart: Regular rate and rhythm. No rubs, murmurs, or gallops. Abdomen: Soft, nontender, nondistended. No organomegaly noted, normoactive bowel sounds. Musculoskeletal: No edema, cyanosis, or clubbing. Neuro: Alert, answering all questions appropriately. Cranial nerves grossly intact. Skin: No rashes or petechiae noted. Psych: Normal affect. Lymphatics: No cervical, calvicular, axillary or inguinal LAD.   LAB RESULTS:  Lab Results  Component Value Date   NA 141 02/02/2016   K 4.3 02/02/2016   CL 102 02/02/2016   CO2 27 02/02/2016   GLUCOSE 93 02/02/2016   BUN 12 02/02/2016   CREATININE 0.81 02/02/2016   CALCIUM 8.7 02/02/2016   PROT 6.0 09/10/2015   ALBUMIN 3.8 09/10/2015   AST 30 09/10/2015   ALT 24 09/10/2015   ALKPHOS 76 09/10/2015   BILITOT 0.4 09/10/2015   GFRNONAA 90 02/02/2016   GFRAA 104 02/02/2016    Lab Results  Component Value Date   WBC 2.8 (L) 03/04/2016   NEUTROABS 1.9 03/04/2016   HGB 11.4 (A) 01/25/2015   HCT 31.0 (L) 03/04/2016   MCV 75 (L) 03/04/2016   PLT 157 03/04/2016     STUDIES: No results found.  ASSESSMENT: Iron deficiency anemia refractory to oral iron therapy  PLAN:   1.  Iron deficiency anemia refractory to oral iron therapy:   Patient expressed understanding and was in agreement with  this plan. She also understands that She can call clinic at any time with any questions, concerns, or complaints.   No matching staging information was found for the patient.  Lloyd Huger, MD   03/24/2016 8:28 AM

## 2016-03-30 ENCOUNTER — Encounter: Payer: Self-pay | Admitting: Internal Medicine

## 2016-03-31 ENCOUNTER — Inpatient Hospital Stay: Payer: Managed Care, Other (non HMO) | Attending: Oncology | Admitting: Oncology

## 2016-03-31 ENCOUNTER — Inpatient Hospital Stay: Payer: Managed Care, Other (non HMO)

## 2016-03-31 ENCOUNTER — Encounter: Payer: Self-pay | Admitting: *Deleted

## 2016-03-31 ENCOUNTER — Encounter: Payer: Self-pay | Admitting: Oncology

## 2016-03-31 VITALS — BP 111/63 | HR 74 | Temp 99.8°F | Resp 18 | Wt 148.8 lb

## 2016-03-31 DIAGNOSIS — R531 Weakness: Secondary | ICD-10-CM

## 2016-03-31 DIAGNOSIS — K219 Gastro-esophageal reflux disease without esophagitis: Secondary | ICD-10-CM | POA: Diagnosis not present

## 2016-03-31 DIAGNOSIS — I10 Essential (primary) hypertension: Secondary | ICD-10-CM | POA: Insufficient documentation

## 2016-03-31 DIAGNOSIS — D509 Iron deficiency anemia, unspecified: Secondary | ICD-10-CM | POA: Diagnosis present

## 2016-03-31 DIAGNOSIS — Z79899 Other long term (current) drug therapy: Secondary | ICD-10-CM

## 2016-03-31 DIAGNOSIS — K449 Diaphragmatic hernia without obstruction or gangrene: Secondary | ICD-10-CM | POA: Diagnosis not present

## 2016-03-31 DIAGNOSIS — D508 Other iron deficiency anemias: Secondary | ICD-10-CM

## 2016-03-31 DIAGNOSIS — F419 Anxiety disorder, unspecified: Secondary | ICD-10-CM

## 2016-03-31 DIAGNOSIS — Z903 Acquired absence of stomach [part of]: Secondary | ICD-10-CM | POA: Diagnosis not present

## 2016-03-31 DIAGNOSIS — Z9049 Acquired absence of other specified parts of digestive tract: Secondary | ICD-10-CM

## 2016-03-31 DIAGNOSIS — F329 Major depressive disorder, single episode, unspecified: Secondary | ICD-10-CM | POA: Diagnosis not present

## 2016-03-31 DIAGNOSIS — R5383 Other fatigue: Secondary | ICD-10-CM | POA: Diagnosis not present

## 2016-03-31 DIAGNOSIS — I340 Nonrheumatic mitral (valve) insufficiency: Secondary | ICD-10-CM | POA: Diagnosis not present

## 2016-03-31 DIAGNOSIS — E039 Hypothyroidism, unspecified: Secondary | ICD-10-CM | POA: Insufficient documentation

## 2016-03-31 DIAGNOSIS — Z8669 Personal history of other diseases of the nervous system and sense organs: Secondary | ICD-10-CM | POA: Diagnosis not present

## 2016-03-31 MED ORDER — SODIUM CHLORIDE 0.9 % IV SOLN
510.0000 mg | Freq: Once | INTRAVENOUS | Status: AC
  Administered 2016-03-31: 510 mg via INTRAVENOUS
  Filled 2016-03-31: qty 17

## 2016-03-31 MED ORDER — SODIUM CHLORIDE 0.9 % IV SOLN
Freq: Once | INTRAVENOUS | Status: AC
  Administered 2016-03-31: 15:00:00 via INTRAVENOUS
  Filled 2016-03-31: qty 1000

## 2016-03-31 NOTE — Progress Notes (Signed)
[facility]  Telephone:(336) [phone] Fax:(336) [phone]  ID: [name] OB: [birth year]  MR#: [id number]  [id number]  Patient Care Team: Einar Pheasant, MD as PCP - General (Unknown Physician Specialty)  CHIEF COMPLAINT:  Iron deficiency anemia refractory to oral iron therapy.  INTERVAL HISTORY: Patient last evaluated in clinic greater than 2 years ago. She is referred back for declining hemoglobin and iron stores. Patient states she has significant weakness and fatigue. She has no neurologic complaints. She denies any recent fevers or illnesses. She has good appetite and denies weight loss. She has no chest pain or shortness of breath. She denies any nausea, vomiting, constipation, or diarrhea. She has no urinary complaints. Patient otherwise feels well and offers no further specific complaints.  REVIEW OF SYSTEMS:   Review of Systems  Constitutional: Positive for malaise/fatigue. Negative for fever and weight loss.  Respiratory: Negative.  Negative for cough and shortness of breath.   Cardiovascular: Negative.  Negative for chest pain and leg swelling.  Gastrointestinal: Negative.  Negative for abdominal pain, blood in stool and melena.  Genitourinary: Negative.   Musculoskeletal: Negative.   Neurological: Positive for weakness.  Psychiatric/Behavioral: Negative.  The patient is not nervous/anxious.     As per HPI. Otherwise, a complete review of systems is negative.  PAST MEDICAL HISTORY: Past Medical History:  Diagnosis Date  . Anemia   . Anxiety   . Depression   . GERD (gastroesophageal reflux disease)   . H/O hiatal hernia   . Hypertension   . Hypertr obst cardiomyop    mitral regurgitation, LAE - followed at [facility]  . Hypothyroidism   . Migraine headache   . PONV (postoperative nausea and vomiting)     PAST SURGICAL HISTORY: Past Surgical History:  Procedure Laterality Date  . BUNIONECTOMY    . CHOLECYSTECTOMY    . HIATAL HERNIA REPAIR      . IMPLANTABLE CARDIOVERTER DEFIBRILLATOR REVISION  05/21/14   [facility]  . PARTIAL GASTRECTOMY    . TONSILLECTOMY      FAMILY HISTORY: Family History  Problem Relation Age of Onset  . Anesthesia problems Father   . Hypertension Mother   . Hashimoto's thyroiditis Maternal Grandmother   . Hypertension Maternal Grandmother   . Breast cancer Neg Hx   . Colon cancer Neg Hx     ADVANCED DIRECTIVES (Y/N):  N  HEALTH MAINTENANCE: Social History  Substance Use Topics  . Smoking status: Never Smoker  . Smokeless tobacco: Never Used  . Alcohol use No     Colonoscopy:  PAP:  Bone density:  Lipid panel:  Allergies  Allergen Reactions  . Shellfish-Derived Products Swelling  . Penicillins Hives and Rash  . Chloraprep One Step [Chlorhexidine Gluconate] Itching  . Vancomycin Itching and Dermatitis  . Carafate [Sucralfate] Rash  . Iodine Rash  . Sulfa Antibiotics Hives and Rash    Current Outpatient Prescriptions  Medication Sig Dispense Refill  . acetaminophen (TYLENOL) 500 MG tablet Take 500 mg by mouth every 6 (six) hours as needed. For headache    . alosetron (LOTRONEX) 0.5 MG tablet Take 1 tablet (0.5 mg total) by mouth 2 (two) times daily. 60 tablet 0  . ALPRAZolam (XANAX) 0.5 MG tablet 0.5 mg 3 (three) times daily as needed.     . Brexpiprazole (REXULTI) 0.5 MG TABS Take 1 tablet by mouth daily.    . cyclobenzaprine (FLEXERIL) 10 MG tablet Take 1 tablet (10 mg total) by mouth daily as needed for muscle  spasms. 30 tablet 0  . desvenlafaxine (PRISTIQ) 100 MG 24 hr tablet Take 100 mg by mouth daily.    Marland Kitchen EPINEPHrine (EPIPEN 2-PAK) 0.3 mg/0.3 mL IJ SOAJ injection USE AS DIRECTED ON PACKAGE    . furosemide (LASIX) 20 MG tablet Take 20 mg by mouth daily as needed.     Marland Kitchen levothyroxine (SYNTHROID) 100 MCG tablet Take 1 tablet by mouth  daily before breakfast 90 tablet 1  . metoprolol tartrate (LOPRESSOR) 25 MG tablet Take 25 mg by mouth 2 (two) times daily. 50mg  in the morning 25 mg  at night    . ondansetron (ZOFRAN) 4 MG tablet Take 1 tablet (4 mg total) by mouth 2 (two) times daily as needed for nausea or vomiting. 20 tablet 0  . pantoprazole (PROTONIX) 40 MG tablet Take 1 tablet by mouth  daily 90 tablet 3  . SUMAtriptan (IMITREX) 50 MG tablet Take 1 tablet by mouth  every 2 hours as needed for migraine(s) 54 tablet 2  . traZODone (DESYREL) 50 MG tablet 50 mg. Takes 1/2 tablet as needed at bedtime    . vitamin B-12 (CYANOCOBALAMIN) 1000 MCG tablet Take 1 tablet (1,000 mcg total) by mouth daily. 30 tablet 3   No current facility-administered medications for this visit.    Facility-Administered Medications Ordered in Other Visits  Medication Dose Route Frequency Provider Last Rate Last Dose  . 0.9 %  sodium chloride infusion   Intravenous Once Lloyd Huger, MD      . ferumoxytol [facility]) 510 mg in sodium chloride 0.9 % 100 mL IVPB  510 mg Intravenous Once Lloyd Huger, MD        OBJECTIVE: Vitals:   03/31/16 1348  BP: 111/63  Pulse: 74  Resp: 18  Temp: 99.8 F (37.7 C)     Body mass index is 23.31 kg/m.    ECOG FS:0 - Asymptomatic  General: Well-developed, well-nourished, no acute distress. Eyes: Pink conjunctiva, anicteric sclera. HEENT: Normocephalic, moist mucous membranes, clear oropharnyx. Lungs: Clear to auscultation bilaterally. Heart: Regular rate and rhythm. No rubs, murmurs, or gallops. Abdomen: Soft, nontender, nondistended. No organomegaly noted, normoactive bowel sounds. Musculoskeletal: No edema, cyanosis, or clubbing. Neuro: Alert, answering all questions appropriately. Cranial nerves grossly intact. Skin: No rashes or petechiae noted. Psych: Normal affect. Lymphatics: No cervical, calvicular, axillary or inguinal LAD.   LAB RESULTS:  Lab Results  Component Value Date   NA 141 02/02/2016   K 4.3 02/02/2016   CL 102 02/02/2016   CO2 27 02/02/2016   GLUCOSE 93 02/02/2016   BUN 12 02/02/2016   CREATININE 0.81 02/02/2016    CALCIUM 8.7 02/02/2016   PROT 6.0 09/10/2015   ALBUMIN 3.8 09/10/2015   AST 30 09/10/2015   ALT 24 09/10/2015   ALKPHOS 76 09/10/2015   BILITOT 0.4 09/10/2015   GFRNONAA 90 02/02/2016   GFRAA 104 02/02/2016    Lab Results  Component Value Date   WBC 2.8 (L) 03/04/2016   NEUTROABS 1.9 03/04/2016   HGB 11.4 (A) 01/25/2015   HCT 31.0 (L) 03/04/2016   MCV 75 (L) 03/04/2016   PLT 157 03/04/2016   Lab Results  Component Value Date   IRON 85 02/19/2013   TIBC 332 02/19/2013   IRONPCTSAT 26 02/19/2013   Lab Results  Component Value Date   FERRITIN 11 (L) 03/04/2016     STUDIES: No results found.  ASSESSMENT: Iron deficiency anemia refractory to oral iron therapy  PLAN:   1.  Iron  deficiency anemia refractory to oral iron therapy: Likely secondary to poor absorption given her history of partial gastrectomy. Patient noted to have a decreased hemoglobin and significantly decreased ferritin level. She is also symptomatic. She previously responded to IV Feraheme. Proceed with 510 mg IV Feraheme today. Return to clinic in 1 week for a second infusion. Patient will then return to clinic in 2 months with repeat laboratory work and further evaluation.  Approximately 30 minutes was spent in discussion of which greater than 50% was consultation.  Patient expressed understanding and was in agreement with this plan. She also understands that She can call clinic at any time with any questions, concerns, or complaints.    Lloyd Huger, MD   03/31/2016 3:13 PM

## 2016-03-31 NOTE — Progress Notes (Signed)
Pt states feels fatigued all the time and has no energy. States that can have very low blood pressure readings at times.

## 2016-04-07 ENCOUNTER — Inpatient Hospital Stay: Payer: Managed Care, Other (non HMO)

## 2016-04-07 VITALS — BP 105/66 | HR 70 | Temp 99.1°F | Resp 20

## 2016-04-07 DIAGNOSIS — D508 Other iron deficiency anemias: Secondary | ICD-10-CM

## 2016-04-07 DIAGNOSIS — D509 Iron deficiency anemia, unspecified: Secondary | ICD-10-CM | POA: Diagnosis not present

## 2016-04-07 MED ORDER — SODIUM CHLORIDE 0.9 % IV SOLN
Freq: Once | INTRAVENOUS | Status: AC
  Administered 2016-04-07: 15:00:00 via INTRAVENOUS
  Filled 2016-04-07: qty 1000

## 2016-04-07 MED ORDER — SODIUM CHLORIDE 0.9 % IV SOLN
510.0000 mg | Freq: Once | INTRAVENOUS | Status: AC
  Administered 2016-04-07: 510 mg via INTRAVENOUS
  Filled 2016-04-07: qty 17

## 2016-05-03 ENCOUNTER — Telehealth: Payer: Self-pay | Admitting: Gastroenterology

## 2016-05-03 NOTE — Telephone Encounter (Signed)
Patient left a voice message that she needs a refill with a possible dosage change.  She didn't leave the name of the medication

## 2016-05-03 NOTE — Telephone Encounter (Signed)
LVM for pt to return my call regarding refill on Lotronex and dose change.

## 2016-05-06 ENCOUNTER — Other Ambulatory Visit: Payer: Self-pay

## 2016-05-06 ENCOUNTER — Ambulatory Visit (INDEPENDENT_AMBULATORY_CARE_PROVIDER_SITE_OTHER): Payer: Managed Care, Other (non HMO) | Admitting: Internal Medicine

## 2016-05-06 ENCOUNTER — Encounter: Payer: Self-pay | Admitting: Internal Medicine

## 2016-05-06 DIAGNOSIS — F341 Dysthymic disorder: Secondary | ICD-10-CM

## 2016-05-06 DIAGNOSIS — G43809 Other migraine, not intractable, without status migrainosus: Secondary | ICD-10-CM | POA: Diagnosis not present

## 2016-05-06 DIAGNOSIS — I10 Essential (primary) hypertension: Secondary | ICD-10-CM

## 2016-05-06 DIAGNOSIS — D508 Other iron deficiency anemias: Secondary | ICD-10-CM

## 2016-05-06 DIAGNOSIS — K219 Gastro-esophageal reflux disease without esophagitis: Secondary | ICD-10-CM

## 2016-05-06 DIAGNOSIS — Z95810 Presence of automatic (implantable) cardiac defibrillator: Secondary | ICD-10-CM

## 2016-05-06 DIAGNOSIS — I421 Obstructive hypertrophic cardiomyopathy: Secondary | ICD-10-CM

## 2016-05-06 DIAGNOSIS — R5383 Other fatigue: Secondary | ICD-10-CM

## 2016-05-06 DIAGNOSIS — K580 Irritable bowel syndrome with diarrhea: Secondary | ICD-10-CM

## 2016-05-06 DIAGNOSIS — E039 Hypothyroidism, unspecified: Secondary | ICD-10-CM

## 2016-05-06 MED ORDER — ALOSETRON HCL 1 MG PO TABS: 1.0000 mg | ORAL_TABLET | Freq: Two times a day (BID) | ORAL | 5 refills | Status: DC

## 2016-05-06 NOTE — Telephone Encounter (Signed)
LVM again for pt to return my call.  

## 2016-05-06 NOTE — Progress Notes (Signed)
Patient ID: Carol Mahoney, female   DOB: 1975/08/29, 40 y.o.   MRN: [id number]   Subjective:    Patient ID: Carol Mahoney, female    DOB: 1976/03/19, 40 y.o.   MRN: [id number]  HPI  Patient here for a scheduled follow up.  Seeing Dr Grayland Ormond for her anemia.  Receiving IV iron infusions.  She does feel some better.  Energy is a little better.  Still seeing cardiology for HOCM s/p septal myectomy.  He discussed treatment options with her.  Discussed alcohol septal ablation versus repeat septal myectomy.  We discussed this today.  She is planning to f/u with Dr Evelina Dun to discuss his opinion about the procedures.  She also saw Dr Allen Norris recently.  See note.  Has lotrenex.  Wanted there to try this and see if symptoms improved.  Did not feel f/u scopes were warranted at this time.  Bowels - same.  Increased stress with her daughter.  Discussed with her today.  She is seeing psychiatry.  With all of the above, getting harder for her to maintain work and home.     Past Medical History:  Diagnosis Date  . Anemia   . Anxiety   . Depression   . GERD (gastroesophageal reflux disease)   . H/O hiatal hernia   . Hypertension   . Hypertr obst cardiomyop    mitral regurgitation, LAE - followed at [facility]  . Hypothyroidism   . Migraine headache   . PONV (postoperative nausea and vomiting)    Past Surgical History:  Procedure Laterality Date  . BUNIONECTOMY    . CHOLECYSTECTOMY    . HIATAL HERNIA REPAIR    . IMPLANTABLE CARDIOVERTER DEFIBRILLATOR REVISION  05/21/14   [facility]  . PARTIAL GASTRECTOMY    . TONSILLECTOMY     Family History  Problem Relation Age of Onset  . Anesthesia problems Father   . Hypertension Mother   . Hashimoto's thyroiditis Maternal Grandmother   . Hypertension Maternal Grandmother   . Breast cancer Neg Hx   . Colon cancer Neg Hx    Social History   Social History  . Marital status: Single    Spouse name: N/A  . Number of children: 1  . Years of education: N/A    Occupational History  . [employer]   Social History Main Topics  . Smoking status: Never Smoker  . Smokeless tobacco: Never Used  . Alcohol use No  . Drug use: No  . Sexual activity: Not Currently   Other Topics Concern  . None   Social History Narrative  . None    Outpatient Encounter Prescriptions as of 05/06/2016  Medication Sig  . acetaminophen (TYLENOL) 500 MG tablet Take 500 mg by mouth every 6 (six) hours as needed. For headache  . alosetron (LOTRONEX) 0.5 MG tablet Take 1 tablet (0.5 mg total) by mouth 2 (two) times daily. (Patient taking differently: Take 1 mg by mouth 2 (two) times daily. )  . alosetron (LOTRONEX) 1 MG tablet Take 1 tablet (1 mg total) by mouth 2 (two) times daily.  Marland Kitchen ALPRAZolam (XANAX) 0.5 MG tablet 0.5 mg 3 (three) times daily as needed.   . cyclobenzaprine (FLEXERIL) 10 MG tablet Take 1 tablet (10 mg total) by mouth daily as needed for muscle spasms.  Marland Kitchen desvenlafaxine (PRISTIQ) 100 MG 24 hr tablet Take 100 mg by mouth daily.  Marland Kitchen EPINEPHrine (EPIPEN 2-PAK) 0.3 mg/0.3 mL IJ SOAJ injection USE AS DIRECTED ON PACKAGE  .  furosemide (LASIX) 20 MG tablet Take 20 mg by mouth daily as needed.   Marland Kitchen levothyroxine (SYNTHROID) 100 MCG tablet Take 1 tablet by mouth  daily before breakfast  . metoprolol tartrate (LOPRESSOR) 25 MG tablet Take 25 mg by mouth 2 (two) times daily. 50mg  in the morning 25 mg at night  . ondansetron (ZOFRAN) 4 MG tablet Take 1 tablet (4 mg total) by mouth 2 (two) times daily as needed for nausea or vomiting.  . pantoprazole (PROTONIX) 40 MG tablet Take 1 tablet by mouth  daily  . SUMAtriptan (IMITREX) 50 MG tablet Take 1 tablet by mouth  every 2 hours as needed for migraine(s)  . traZODone (DESYREL) 50 MG tablet 50 mg. Takes 1/2 tablet as needed at bedtime  . vitamin B-12 (CYANOCOBALAMIN) 1000 MCG tablet Take 1 tablet (1,000 mcg total) by mouth daily.  . Brexpiprazole (REXULTI) 0.5 MG TABS Take 1 tablet by mouth daily.   No  facility-administered encounter medications on file as of 05/06/2016.     Review of Systems  Constitutional: Positive for fatigue. Negative for appetite change and unexpected weight change.  HENT: Negative for congestion and sinus pressure.   Respiratory: Negative for cough and chest tightness.        Some sob with increased exertion.  Stable.   Cardiovascular: Negative for chest pain and palpitations.       Intermittent leg swelling.    Gastrointestinal: Negative for nausea and vomiting.       Persistent bowel issues as outlined.   Genitourinary: Negative for difficulty urinating and dysuria.  Musculoskeletal: Negative for back pain and joint swelling.  Skin: Negative for color change and rash.  Neurological: Negative for dizziness, light-headedness and headaches.  Psychiatric/Behavioral: Negative for agitation and dysphoric mood.       Increased stress as outlined.        Objective:    Physical Exam  Constitutional: She appears well-developed and well-nourished. No distress.  HENT:  Nose: Nose normal.  Mouth/Throat: Oropharynx is clear and moist.  Neck: Neck supple. No thyromegaly present.  Cardiovascular: Normal rate and regular rhythm.   Pulmonary/Chest: Breath sounds normal. No respiratory distress. She has no wheezes.  Abdominal: Soft. Bowel sounds are normal. There is no tenderness.  Musculoskeletal: She exhibits no edema or tenderness.  Lymphadenopathy:    She has no cervical adenopathy.  Skin: No rash noted. No erythema.  Psychiatric: She has a normal mood and affect. Her behavior is normal.    BP 100/70   Pulse 68   Wt 149 lb 9.6 oz (67.9 kg)   LMP 10/08/2011   SpO2 98%   BMI 23.43 kg/m  Wt Readings from Last 3 Encounters:  05/06/16 149 lb 9.6 oz (67.9 kg)  03/31/16 148 lb 13 oz (67.5 kg)  03/09/16 145 lb (65.8 kg)     Lab Results  Component Value Date   WBC 2.8 (L) 03/04/2016   HGB 11.4 (A) 01/25/2015   HCT 31.0 (L) 03/04/2016   PLT 157 03/04/2016     GLUCOSE 93 02/02/2016   CHOL 161 12/12/2014   TRIG 44 12/12/2014   HDL 75 (A) 12/12/2014   LDLCALC 77 12/12/2014   ALT 24 09/10/2015   AST 30 09/10/2015   NA 141 02/02/2016   K 4.3 02/02/2016   CL 102 02/02/2016   CREATININE 0.81 02/02/2016   BUN 12 02/02/2016   CO2 27 02/02/2016   TSH 1.160 02/02/2016   INR 1.0 09/26/2014   HGBA1C 4.9 12/12/2014  Mm Digital Screening Bilateral  Result Date: 09/09/2015 CLINICAL DATA:  Screening. EXAM: DIGITAL SCREENING BILATERAL MAMMOGRAM WITH CAD COMPARISON:  Previous exam(s). ACR Breast Density Category c: The breast tissue is heterogeneously dense, which may obscure small masses. FINDINGS: There are no findings suspicious for malignancy. Images were processed with CAD. IMPRESSION: No mammographic evidence of malignancy. A result letter of this screening mammogram will be mailed directly to the patient. RECOMMENDATION: Screening mammogram in one year. (Code:SM-B-01Y) BI-RADS CATEGORY  1: Negative. Electronically Signed   By: Claudie Revering M.D.   On: 09/09/2015 12:13       Assessment & Plan:   Problem List Items Addressed This Visit    ANXIETY DEPRESSION    Discussed with her today.  Followed by Dr Toy Care.  Increased stress as outlined.       Essential hypertension    Blood pressure runs low now.  Has to take prn lasix.  Follow.  Stay hydrated.       Fatigue    Has improved some with IV iron infusions.  Being followed by cardiology and hematology.        GERD (gastroesophageal reflux disease)    On protonix.       Hypertrophic obstructive cardiomyopathy (HCC)    S/p septal myectomy.  Followed by Dr Mina Marble - cardiology.  Discussing further treatment options as outlined.        Hypothyroidism    On thyroid replacement.  Follow tsh.        IBS    Saw Dr Allen Norris recently.  On lotrenex.  Follow.        ICD (implantable cardioverter-defibrillator) in place    Followed by cardiology.       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  Seeing hematology.  They are planning f/u labs soon.       Migraine    Stable.  Follow.           Einar Pheasant, MD

## 2016-05-09 ENCOUNTER — Encounter: Payer: Self-pay | Admitting: Internal Medicine

## 2016-05-09 NOTE — Assessment & Plan Note (Signed)
Stable.  Follow.   

## 2016-05-09 NOTE — Assessment & Plan Note (Signed)
On protonix

## 2016-05-09 NOTE — Assessment & Plan Note (Signed)
On thyroid replacement.  Follow tsh.  

## 2016-05-09 NOTE — Assessment & Plan Note (Signed)
Discussed with her today.  Followed by Dr Toy Care.  Increased stress as outlined.

## 2016-05-09 NOTE — Assessment & Plan Note (Signed)
S/p septal myectomy.  Followed by Dr Mina Marble - cardiology.  Discussing further treatment options as outlined.

## 2016-05-09 NOTE — Assessment & Plan Note (Signed)
Receiving IV iron infusions.  Seeing hematology.  They are planning f/u labs soon.

## 2016-05-09 NOTE — Assessment & Plan Note (Signed)
Saw Dr Allen Norris recently.  On lotrenex.  Follow.

## 2016-05-09 NOTE — Assessment & Plan Note (Signed)
Followed by cardiology 

## 2016-05-09 NOTE — Assessment & Plan Note (Signed)
Has improved some with IV iron infusions.  Being followed by cardiology and hematology.

## 2016-05-09 NOTE — Assessment & Plan Note (Signed)
Blood pressure runs low now.  Has to take prn lasix.  Follow.  Stay hydrated.

## 2016-05-10 NOTE — Telephone Encounter (Signed)
Pt returned my call and she requested to increase her Lotronex to 1mg  BID. Ok'd per Dr. Allen Norris.

## 2016-05-28 ENCOUNTER — Other Ambulatory Visit: Payer: Self-pay | Admitting: Oncology

## 2016-05-31 ENCOUNTER — Inpatient Hospital Stay: Payer: Managed Care, Other (non HMO)

## 2016-05-31 ENCOUNTER — Inpatient Hospital Stay: Payer: Managed Care, Other (non HMO) | Attending: Oncology | Admitting: Oncology

## 2016-05-31 VITALS — BP 102/65 | HR 64 | Temp 97.7°F | Resp 18 | Wt 147.7 lb

## 2016-05-31 DIAGNOSIS — Z9049 Acquired absence of other specified parts of digestive tract: Secondary | ICD-10-CM

## 2016-05-31 DIAGNOSIS — R5383 Other fatigue: Secondary | ICD-10-CM

## 2016-05-31 DIAGNOSIS — F329 Major depressive disorder, single episode, unspecified: Secondary | ICD-10-CM

## 2016-05-31 DIAGNOSIS — K219 Gastro-esophageal reflux disease without esophagitis: Secondary | ICD-10-CM | POA: Diagnosis not present

## 2016-05-31 DIAGNOSIS — D508 Other iron deficiency anemias: Secondary | ICD-10-CM

## 2016-05-31 DIAGNOSIS — E039 Hypothyroidism, unspecified: Secondary | ICD-10-CM | POA: Diagnosis not present

## 2016-05-31 DIAGNOSIS — D696 Thrombocytopenia, unspecified: Secondary | ICD-10-CM

## 2016-05-31 DIAGNOSIS — R531 Weakness: Secondary | ICD-10-CM | POA: Diagnosis not present

## 2016-05-31 DIAGNOSIS — Z8719 Personal history of other diseases of the digestive system: Secondary | ICD-10-CM | POA: Diagnosis not present

## 2016-05-31 DIAGNOSIS — Z8669 Personal history of other diseases of the nervous system and sense organs: Secondary | ICD-10-CM

## 2016-05-31 DIAGNOSIS — I340 Nonrheumatic mitral (valve) insufficiency: Secondary | ICD-10-CM | POA: Diagnosis not present

## 2016-05-31 DIAGNOSIS — F419 Anxiety disorder, unspecified: Secondary | ICD-10-CM

## 2016-05-31 DIAGNOSIS — D509 Iron deficiency anemia, unspecified: Secondary | ICD-10-CM | POA: Diagnosis not present

## 2016-05-31 LAB — CBC WITH DIFFERENTIAL/PLATELET
BASOS PCT: 0 %
Basophils Absolute: 0 10*3/uL (ref 0–0.1)
EOS ABS: 0 10*3/uL (ref 0–0.7)
Eosinophils Relative: 0 %
HEMATOCRIT: 37.3 % (ref 35.0–47.0)
Hemoglobin: 12.8 g/dL (ref 12.0–16.0)
Lymphocytes Relative: 53 %
Lymphs Abs: 1.9 10*3/uL (ref 1.0–3.6)
MCH: 27.8 pg (ref 26.0–34.0)
MCHC: 34.2 g/dL (ref 32.0–36.0)
MCV: 81.3 fL (ref 80.0–100.0)
MONO ABS: 0.3 10*3/uL (ref 0.2–0.9)
MONOS PCT: 9 %
NEUTROS ABS: 1.4 10*3/uL (ref 1.4–6.5)
Neutrophils Relative %: 38 %
Platelets: 136 10*3/uL — ABNORMAL LOW (ref 150–440)
RBC: 4.59 MIL/uL (ref 3.80–5.20)
RDW: 18.3 % — AB (ref 11.5–14.5)
WBC: 3.6 10*3/uL (ref 3.6–11.0)

## 2016-05-31 LAB — IRON AND TIBC
Iron: 104 ug/dL (ref 28–170)
SATURATION RATIOS: 33 % — AB (ref 10.4–31.8)
TIBC: 312 ug/dL (ref 250–450)
UIBC: 208 ug/dL

## 2016-05-31 LAB — FERRITIN: FERRITIN: 56 ng/mL (ref 11–307)

## 2016-05-31 NOTE — Progress Notes (Signed)
[place]  [phone]) [phone] [phone]) [phone]  ID: Carol Mahoney OB: 1975-11-27  MR#: [id number]  [id number]  Patient Care Team: Einar Pheasant, MD as PCP - General (Unknown Physician Specialty)  CHIEF COMPLAINT:  Iron deficiency anemia refractory to oral iron therapy.  INTERVAL HISTORY: Patient returns to clinic today for repeat laboratory work and further evaluation. She feels significantly improved after receiving 2 doses of IV Feraheme approximately 2 months ago. She continues to have mild weakness and fatigue. She has no neurologic complaints. She denies any recent fevers or illnesses. She has a good appetite and denies weight loss. She has no chest pain or shortness of breath. She denies any nausea, vomiting, constipation, or diarrhea. She has no urinary complaints. Patient offers no further specific complaints.  REVIEW OF SYSTEMS:   Review of Systems  Constitutional: Positive for malaise/fatigue. Negative for fever and weight loss.  Respiratory: Negative.  Negative for cough and shortness of breath.   Cardiovascular: Negative.  Negative for chest pain and leg swelling.  Gastrointestinal: Negative.  Negative for abdominal pain, blood in stool and melena.  Genitourinary: Negative.   Musculoskeletal: Negative.   Neurological: Positive for weakness.  Psychiatric/Behavioral: Negative.  The patient is not nervous/anxious.     As per HPI. Otherwise, a complete review of systems is negative.  PAST MEDICAL HISTORY: Past Medical History:  Diagnosis Date  . Anemia   . Anxiety   . Depression   . GERD (gastroesophageal reflux disease)   . H/O hiatal hernia   . Hypertension   . Hypertr obst cardiomyop    mitral regurgitation, LAE - followed at [facility]  . Hypothyroidism   . Migraine headache   . PONV (postoperative nausea and vomiting)     PAST SURGICAL HISTORY: Past Surgical History:  Procedure Laterality Date  . BUNIONECTOMY    . CHOLECYSTECTOMY     . HIATAL HERNIA REPAIR    . IMPLANTABLE CARDIOVERTER DEFIBRILLATOR REVISION  05/21/14   [facility]  . PARTIAL GASTRECTOMY    . TONSILLECTOMY      FAMILY HISTORY: Family History  Problem Relation Age of Onset  . Anesthesia problems Father   . Hypertension Mother   . Hashimoto's thyroiditis Maternal Grandmother   . Hypertension Maternal Grandmother   . Breast cancer Neg Hx   . Colon cancer Neg Hx     ADVANCED DIRECTIVES (Y/N):  N  HEALTH MAINTENANCE: Social History  Substance Use Topics  . Smoking status: Never Smoker  . Smokeless tobacco: Never Used  . Alcohol use No     Colonoscopy:  PAP:  Bone density:  Lipid panel:  Allergies  Allergen Reactions  . Shellfish-Derived Products Swelling  . Penicillins Hives and Rash  . Chloraprep One Step [Chlorhexidine Gluconate] Itching  . Vancomycin Itching and Dermatitis  . Carafate [Sucralfate] Rash  . Iodine Rash  . Sulfa Antibiotics Hives and Rash    Current Outpatient Prescriptions  Medication Sig Dispense Refill  . acetaminophen (TYLENOL) 500 MG tablet Take 500 mg by mouth every 6 (six) hours as needed. For headache    . alosetron (LOTRONEX) 0.5 MG tablet Take 1 tablet (0.5 mg total) by mouth 2 (two) times daily. (Patient taking differently: Take 1 mg by mouth 2 (two) times daily. ) 60 tablet 0  . alosetron (LOTRONEX) 1 MG tablet Take 1 tablet (1 mg total) by mouth 2 (two) times daily. 60 tablet 5  . ALPRAZolam (XANAX) 0.5 MG tablet 0.5 mg 3 (three) times daily as needed.     Marland Kitchen  Brexpiprazole (REXULTI) 0.5 MG TABS Take 1 tablet by mouth daily.    . cyclobenzaprine (FLEXERIL) 10 MG tablet Take 1 tablet (10 mg total) by mouth daily as needed for muscle spasms. 30 tablet 0  . desvenlafaxine (PRISTIQ) 100 MG 24 hr tablet Take 100 mg by mouth daily.    Marland Kitchen EPINEPHrine (EPIPEN 2-PAK) 0.3 mg/0.3 mL IJ SOAJ injection USE AS DIRECTED ON PACKAGE    . furosemide (LASIX) 20 MG tablet Take 20 mg by mouth daily as needed.     Marland Kitchen  levothyroxine (SYNTHROID) 100 MCG tablet Take 1 tablet by mouth  daily before breakfast 90 tablet 1  . metoprolol tartrate (LOPRESSOR) 25 MG tablet Take 25 mg by mouth 2 (two) times daily. 50mg  in the morning 25 mg at night    . ondansetron (ZOFRAN) 4 MG tablet Take 1 tablet (4 mg total) by mouth 2 (two) times daily as needed for nausea or vomiting. 20 tablet 0  . pantoprazole (PROTONIX) 40 MG tablet Take 1 tablet by mouth  daily 90 tablet 3  . SUMAtriptan (IMITREX) 50 MG tablet Take 1 tablet by mouth  every 2 hours as needed for migraine(s) 54 tablet 2  . traZODone (DESYREL) 50 MG tablet 50 mg. Takes 1/2 tablet as needed at bedtime    . vitamin B-12 (CYANOCOBALAMIN) 1000 MCG tablet Take 1 tablet (1,000 mcg total) by mouth daily. 30 tablet 3   No current facility-administered medications for this visit.     OBJECTIVE: Vitals:   05/31/16 1353  BP: 102/65  Pulse: 64  Resp: 18  Temp: 97.7 F (36.5 C)     Body mass index is 23.13 kg/m.    ECOG FS:0 - Asymptomatic  General: Well-developed, well-nourished, no acute distress. Eyes: Pink conjunctiva, anicteric sclera. Lungs: Clear to auscultation bilaterally. Heart: Regular rate and rhythm. No rubs, murmurs, or gallops. Abdomen: Soft, nontender, nondistended. No organomegaly noted, normoactive bowel sounds. Musculoskeletal: No edema, cyanosis, or clubbing. Neuro: Alert, answering all questions appropriately. Cranial nerves grossly intact. Skin: No rashes or petechiae noted. Psych: Normal affect.   LAB RESULTS:  Lab Results  Component Value Date   NA 141 02/02/2016   K 4.3 02/02/2016   CL 102 02/02/2016   CO2 27 02/02/2016   GLUCOSE 93 02/02/2016   BUN 12 02/02/2016   CREATININE 0.81 02/02/2016   CALCIUM 8.7 02/02/2016   PROT 6.0 09/10/2015   ALBUMIN 3.8 09/10/2015   AST 30 09/10/2015   ALT 24 09/10/2015   ALKPHOS 76 09/10/2015   BILITOT 0.4 09/10/2015   GFRNONAA 90 02/02/2016   GFRAA 104 02/02/2016    Lab Results    Component Value Date   WBC 3.6 05/31/2016   NEUTROABS 1.4 05/31/2016   HGB 12.8 05/31/2016   HCT 37.3 05/31/2016   MCV 81.3 05/31/2016   PLT 136 (L) 05/31/2016   Lab Results  Component Value Date   IRON 104 05/31/2016   TIBC 312 05/31/2016   IRONPCTSAT 33 (H) 05/31/2016   Lab Results  Component Value Date   FERRITIN 56 05/31/2016     STUDIES: No results found.  ASSESSMENT: Iron deficiency anemia refractory to oral iron therapy  PLAN:   1.  Iron deficiency anemia refractory to oral iron therapy: Likely secondary to poor absorption given her history of partial gastrectomy. Patient's hemoglobin and iron stores are now within normal limits. She does not require additional IV iron today. Patient last received IV Feraheme on April 07, 2016. Return to clinic in  4 months  with repeat laboratory work and further evaluation. 2. Thrombocytopenia: Mild, monitor.   Patient expressed understanding and was in agreement with this plan. She also understands that She can call clinic at any time with any questions, concerns, or complaints.    Lloyd Huger, MD   05/31/2016 3:04 PM

## 2016-05-31 NOTE — Progress Notes (Signed)
States is feeling much better after last 2 iron infusions. Offers no complaints today.

## 2016-07-12 ENCOUNTER — Encounter: Payer: Self-pay | Admitting: Internal Medicine

## 2016-07-12 MED ORDER — CYCLOBENZAPRINE HCL 5 MG PO TABS: 5.0000 mg | ORAL_TABLET | Freq: Every evening | ORAL | 0 refills | Status: DC | PRN

## 2016-07-12 NOTE — Telephone Encounter (Signed)
rx ok'd for flexeril #15 with no refills.

## 2016-07-27 ENCOUNTER — Encounter: Payer: Self-pay | Admitting: Oncology

## 2016-08-06 ENCOUNTER — Encounter: Payer: Self-pay | Admitting: Oncology

## 2016-08-06 ENCOUNTER — Other Ambulatory Visit: Payer: Self-pay

## 2016-08-06 DIAGNOSIS — R197 Diarrhea, unspecified: Secondary | ICD-10-CM

## 2016-08-06 MED ORDER — ALOSETRON HCL 1 MG PO TABS: 1.0000 mg | ORAL_TABLET | Freq: Two times a day (BID) | ORAL | 1 refills | Status: DC

## 2016-08-10 LAB — FECAL FAT, QUALITATIVE
FAT QUAL NEUTRAL STL: NORMAL
FAT QUAL TOTAL STL: NORMAL

## 2016-08-17 ENCOUNTER — Encounter: Payer: Self-pay | Admitting: Internal Medicine

## 2016-08-17 ENCOUNTER — Ambulatory Visit (INDEPENDENT_AMBULATORY_CARE_PROVIDER_SITE_OTHER): Payer: Managed Care, Other (non HMO) | Admitting: Internal Medicine

## 2016-08-17 VITALS — BP 102/62 | HR 69 | Temp 98.6°F | Resp 16 | Ht 67.0 in | Wt 145.6 lb

## 2016-08-17 DIAGNOSIS — R5383 Other fatigue: Secondary | ICD-10-CM | POA: Diagnosis not present

## 2016-08-17 DIAGNOSIS — K219 Gastro-esophageal reflux disease without esophagitis: Secondary | ICD-10-CM

## 2016-08-17 DIAGNOSIS — D508 Other iron deficiency anemias: Secondary | ICD-10-CM | POA: Diagnosis not present

## 2016-08-17 DIAGNOSIS — Z95810 Presence of automatic (implantable) cardiac defibrillator: Secondary | ICD-10-CM | POA: Diagnosis not present

## 2016-08-17 DIAGNOSIS — R200 Anesthesia of skin: Secondary | ICD-10-CM | POA: Diagnosis not present

## 2016-08-17 DIAGNOSIS — Z1239 Encounter for other screening for malignant neoplasm of breast: Secondary | ICD-10-CM

## 2016-08-17 DIAGNOSIS — I10 Essential (primary) hypertension: Secondary | ICD-10-CM

## 2016-08-17 DIAGNOSIS — E039 Hypothyroidism, unspecified: Secondary | ICD-10-CM | POA: Diagnosis not present

## 2016-08-17 DIAGNOSIS — I421 Obstructive hypertrophic cardiomyopathy: Secondary | ICD-10-CM | POA: Diagnosis not present

## 2016-08-17 DIAGNOSIS — Z1231 Encounter for screening mammogram for malignant neoplasm of breast: Secondary | ICD-10-CM | POA: Diagnosis not present

## 2016-08-17 DIAGNOSIS — K580 Irritable bowel syndrome with diarrhea: Secondary | ICD-10-CM | POA: Diagnosis not present

## 2016-08-17 DIAGNOSIS — F341 Dysthymic disorder: Secondary | ICD-10-CM | POA: Diagnosis not present

## 2016-08-17 DIAGNOSIS — R197 Diarrhea, unspecified: Secondary | ICD-10-CM | POA: Diagnosis not present

## 2016-08-17 MED ORDER — PANTOPRAZOLE SODIUM 40 MG PO TBEC: 40.0000 mg | DELAYED_RELEASE_TABLET | Freq: Every day | ORAL | 3 refills | Status: DC

## 2016-08-17 MED ORDER — LEVOTHYROXINE SODIUM 100 MCG PO TABS
ORAL_TABLET | ORAL | 1 refills | Status: DC
Start: 2016-08-17 — End: 2016-08-19

## 2016-08-17 NOTE — Progress Notes (Addendum)
Patient ID: Carol Mahoney, female   DOB: February 13, 1976, 41 y.o.   MRN: [id number]   Subjective:    Patient ID: Carol Mahoney, female    DOB: [birth year], 41 y.o.   MRN: [id number]  HPI  Patient here for a scheduled follow up.  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  Recommended continue metoprolol.  Remote monitoring in 3 months.  Has f/u with Dr Mina Marble on Monday.  Notices occasionally what she describes as the episodes of VT.  Will feel - near syncope.  cardiology aware.  States had episode last night.  States hard to describe.  No chest pain.  Breathing stable.  Just felt a little funny.  Lasted most of the night.  Denies now.  Also has iron deficient anemia.  Followed by Dr Grayland Ormond.  Last hgb wnl.  Recommended f/u in 4 months.  Last seen 05/2016.  Increased stress.  Discussed with her today.  Seeing Dr Toy Care and a counselor Griffin Basil).  Adjusting medication.  She is eating.  Bowels flare intermittently.  Seeing Dr Allen Norris.  Worse recently.  She dicussed with Dr Allen Norris.  Recommended liquid immodium.  Has not started.  No abdominal pain.     Past Medical History:  Diagnosis Date  . Anemia   . Anxiety   . Depression   . GERD (gastroesophageal reflux disease)   . H/O hiatal hernia   . Hypertension   . Hypertr obst cardiomyop    mitral regurgitation, LAE - followed at [facility]  . Hypothyroidism   . Migraine headache   . PONV (postoperative nausea and vomiting)    Past Surgical History:  Procedure Laterality Date  . BUNIONECTOMY    . CHOLECYSTECTOMY    . HIATAL HERNIA REPAIR    . IMPLANTABLE CARDIOVERTER DEFIBRILLATOR REVISION  05/21/14   [facility]  . PARTIAL GASTRECTOMY    . TONSILLECTOMY     Family History  Problem Relation Age of Onset  . Anesthesia problems Father   . Hypertension Mother   . Hashimoto's thyroiditis Maternal Grandmother   . Hypertension Maternal Grandmother   . Breast cancer Neg Hx   .  Colon cancer Neg Hx    Social History   Social History  . Marital status: Single    Spouse name: N/A  . Number of children: 1  . Years of education: N/A   Occupational History  . [place]   Social History Main Topics  . Smoking status: Never Smoker  . Smokeless tobacco: Never Used  . Alcohol use No  . Drug use: No  . Sexual activity: Not Currently   Other Topics Concern  . None   Social History Narrative  . None    Outpatient Encounter Prescriptions as of 08/17/2016  Medication Sig  . acetaminophen (TYLENOL) 500 MG tablet Take 500 mg by mouth every 6 (six) hours as needed. For headache  . alosetron (LOTRONEX) 0.5 MG tablet Take 1 tablet (0.5 mg total) by mouth 2 (two) times daily. (Patient taking differently: Take 1 mg by mouth 2 (two) times daily. )  . alosetron (LOTRONEX) 1 MG tablet Take 1 tablet (1 mg total) by mouth 2 (two) times daily.  Marland Kitchen ALPRAZolam (XANAX) 0.5 MG tablet 0.5 mg 3 (three) times daily as needed.   . cyclobenzaprine (FLEXERIL) 5 MG tablet Take 1 tablet (5 mg total) by mouth at bedtime as needed for muscle spasms.  Marland Kitchen  desvenlafaxine (PRISTIQ) 100 MG 24 hr tablet Take 100 mg by mouth daily.  Marland Kitchen EPINEPHrine (EPIPEN 2-PAK) 0.3 mg/0.3 mL IJ SOAJ injection USE AS DIRECTED ON PACKAGE  . furosemide (LASIX) 20 MG tablet Take 20 mg by mouth daily as needed.   . metoprolol tartrate (LOPRESSOR) 25 MG tablet Take 25 mg by mouth 2 (two) times daily. 50mg  in the morning 25 mg at night  . ondansetron (ZOFRAN) 4 MG tablet Take 1 tablet (4 mg total) by mouth 2 (two) times daily as needed for nausea or vomiting.  . pantoprazole (PROTONIX) 40 MG tablet Take 1 tablet (40 mg total) by mouth daily.  . SUMAtriptan (IMITREX) 50 MG tablet Take 1 tablet by mouth  every 2 hours as needed for migraine(s)  . traZODone (DESYREL) 50 MG tablet 50 mg. Takes 1/2 tablet as needed at bedtime  . vitamin B-12 (CYANOCOBALAMIN) 1000 MCG tablet Take 1 tablet (1,000 mcg total) by  mouth daily.  . [DISCONTINUED] levothyroxine (SYNTHROID) 100 MCG tablet Take 1 tablet by mouth  daily before breakfast  . [DISCONTINUED] levothyroxine (SYNTHROID) 100 MCG tablet Take 1 tablet by mouth  daily before breakfast  . [DISCONTINUED] pantoprazole (PROTONIX) 40 MG tablet Take 1 tablet by mouth  daily  . levothyroxine (SYNTHROID, LEVOTHROID) 88 MCG tablet Take 1 tablet (88 mcg total) by mouth daily.  . [DISCONTINUED] Brexpiprazole (REXULTI) 0.5 MG TABS Take 1 tablet by mouth daily.   No facility-administered encounter medications on file as of 08/17/2016.     Review of Systems  Constitutional: Negative for appetite change and unexpected weight change.  HENT: Negative for congestion and sinus pressure.   Respiratory: Negative for cough, chest tightness and shortness of breath.   Cardiovascular: Negative for chest pain, palpitations and leg swelling.  Gastrointestinal: Negative for abdominal pain, diarrhea, nausea and vomiting.  Genitourinary: Negative for difficulty urinating and dysuria.  Musculoskeletal: Negative for back pain and joint swelling.  Skin: Negative for color change and rash.  Neurological: Negative for dizziness, light-headedness and headaches.  Psychiatric/Behavioral: Negative for agitation and dysphoric mood.       Objective:    Physical Exam  Constitutional: She appears well-developed and well-nourished. No distress.  HENT:  Nose: Nose normal.  Mouth/Throat: Oropharynx is clear and moist.  Neck: Neck supple. No thyromegaly present.  Cardiovascular: Normal rate and regular rhythm.   Pulmonary/Chest: Breath sounds normal. No respiratory distress. She has no wheezes.  Abdominal: Soft. Bowel sounds are normal. There is no tenderness.  Musculoskeletal: She exhibits no edema or tenderness.  Lymphadenopathy:    She has no cervical adenopathy.  Skin: No rash noted. No erythema.  Psychiatric: She has a normal mood and affect. Her behavior is normal.    BP  102/62 (BP Location: Left Arm, Patient Position: Sitting, Cuff Size: Normal)   Pulse 69   Temp 98.6 F (37 C) (Oral)   Resp 16   Ht 5\' 7"  (1.702 m)   Wt 145 lb 9.6 oz (66 kg)   LMP 10/08/2011   SpO2 98%   BMI 22.80 kg/m  Wt Readings from Last 3 Encounters:  08/17/16 145 lb 9.6 oz (66 kg)  05/31/16 147 lb 11.3 oz (67 kg)  05/06/16 149 lb 9.6 oz (67.9 kg)     Lab Results  Component Value Date   WBC 3.6 05/31/2016   HGB 12.8 05/31/2016   HCT 37.3 05/31/2016   PLT 136 (L) 05/31/2016   GLUCOSE 86 08/17/2016   CHOL 168 08/17/2016  TRIG 65 08/17/2016   HDL 68 08/17/2016   LDLCALC 87 08/17/2016   ALT 22 08/17/2016   AST 30 08/17/2016   NA 142 08/17/2016   K 3.6 08/17/2016   CL 98 08/17/2016   CREATININE 0.73 08/17/2016   BUN 11 08/17/2016   CO2 30 (H) 08/17/2016   TSH 0.266 (L) 08/17/2016   INR 1.0 09/26/2014   HGBA1C 4.9 12/12/2014    Mm Digital Screening Bilateral  Result Date: 09/09/2015 CLINICAL DATA:  Screening. EXAM: DIGITAL SCREENING BILATERAL MAMMOGRAM WITH CAD COMPARISON:  Previous exam(s). ACR Breast Density Category c: The breast tissue is heterogeneously dense, which may obscure small masses. FINDINGS: There are no findings suspicious for malignancy. Images were processed with CAD. IMPRESSION: No mammographic evidence of malignancy. A result letter of this screening mammogram will be mailed directly to the patient. RECOMMENDATION: Screening mammogram in one year. (Code:SM-B-01Y) BI-RADS CATEGORY  1: Negative. Electronically Signed   By: Claudie Revering M.D.   On: 09/09/2015 12:13       Assessment & Plan:   Problem List Items Addressed This Visit    ANXIETY DEPRESSION    Seeing Dr Toy Care and a counselor.  Medications being adjusted.  Follow.        Diarrhea, unspecified    Persistent.  Seeing Dr Allen Norris.  Will try liquid immodium.        Essential hypertension    Blood pressure under good control.  Continue same medication regimen.  Follow pressures.  Follow  metabolic panel.        Relevant Orders   Basic metabolic panel (Completed)   Fatigue   Relevant Orders   Hepatic function panel (Completed)   Magnesium (Completed)   GERD (gastroesophageal reflux disease)    On protonix.        Relevant Medications   pantoprazole (PROTONIX) 40 MG tablet   Hand numbness    Unclear etiology.  No weakness.  Given persistent intermittent numbness and tingling, will refer to neurology for further evaluation and question of need for NCS.        Hypertrophic obstructive cardiomyopathy (HCC)    s/p septal myectomy.   Followed by Dr Mina Marble - cardiology.  Had the episode of different sensation last night as outlined.  EKG - LBBB.  Has f/u with Dr Mina Marble next week.  Discussed with Dr Mina Marble.  Will fax EKG for his review.  Pt comfortable with plan.       Relevant Orders   EKG 12-Lead (Completed)   Lipid panel (Completed)   Hypothyroidism - Primary    On thyroid replacement.  Follow tsh.        Relevant Medications   levothyroxine (SYNTHROID, LEVOTHROID) 88 MCG tablet   Other Relevant Orders   TSH (Completed)   IBS    Has had extensive GI w/up.  Seeing Dr Allen Norris now.  Has lotrenex to use as needed.  She discussed with Dr Allen Norris.  Recommended liquid immodium.  Continue f/u with Dr Allen Norris.        Relevant Medications   pantoprazole (PROTONIX) 40 MG tablet   ICD (implantable cardioverter-defibrillator) in place    Followed by cardiology.       Iron deficiency anemia refractory to iron therapy    Received iron infusions.  Followed by hematology.  They follow cbc.         Other Visit Diagnoses    Screening for breast cancer       Relevant Orders   MM DIGITAL SCREENING  BILATERAL       Einar Pheasant, MD

## 2016-08-17 NOTE — Progress Notes (Signed)
Pre-visit discussion using our clinic review tool. No additional management support is needed unless otherwise documented below in the visit note.  

## 2016-08-18 LAB — BASIC METABOLIC PANEL
BUN/Creatinine Ratio: 15 (ref 9–23)
BUN: 11 mg/dL (ref 6–24)
CALCIUM: 9.3 mg/dL (ref 8.7–10.2)
CO2: 30 mmol/L — AB (ref 18–29)
CREATININE: 0.73 mg/dL (ref 0.57–1.00)
Chloride: 98 mmol/L (ref 96–106)
GFR calc Af Amer: 117 mL/min/{1.73_m2} (ref >59)
GFR, EST NON AFRICAN AMERICAN: 101 mL/min/{1.73_m2} (ref >59)
GLUCOSE: 86 mg/dL (ref 65–99)
POTASSIUM: 3.6 mmol/L (ref 3.5–5.2)
SODIUM: 142 mmol/L (ref 134–144)

## 2016-08-18 LAB — LIPID PANEL
CHOLESTEROL TOTAL: 168 mg/dL (ref 100–199)
Chol/HDL Ratio: 2.5 ratio units (ref 0.0–4.4)
HDL: 68 mg/dL (ref >39)
LDL Calculated: 87 mg/dL (ref 0–99)
Triglycerides: 65 mg/dL (ref 0–149)
VLDL Cholesterol Cal: 13 mg/dL (ref 5–40)

## 2016-08-18 LAB — HEPATIC FUNCTION PANEL
ALK PHOS: 83 IU/L (ref 39–117)
ALT: 22 IU/L (ref 0–32)
AST: 30 IU/L (ref 0–40)
Albumin: 4.1 g/dL (ref 3.5–5.5)
BILIRUBIN, DIRECT: 0.2 mg/dL (ref 0.00–0.40)
Bilirubin Total: 0.8 mg/dL (ref 0.0–1.2)
Total Protein: 6.4 g/dL (ref 6.0–8.5)

## 2016-08-18 LAB — MAGNESIUM: MAGNESIUM: 2.1 mg/dL (ref 1.6–2.3)

## 2016-08-18 LAB — TSH: TSH: 0.266 u[IU]/mL — AB (ref 0.450–4.500)

## 2016-08-19 ENCOUNTER — Telehealth: Payer: Self-pay

## 2016-08-19 ENCOUNTER — Other Ambulatory Visit: Payer: Self-pay | Admitting: Internal Medicine

## 2016-08-19 ENCOUNTER — Telehealth: Payer: Self-pay | Admitting: Internal Medicine

## 2016-08-19 DIAGNOSIS — E039 Hypothyroidism, unspecified: Secondary | ICD-10-CM

## 2016-08-19 MED ORDER — LEVOTHYROXINE SODIUM 88 MCG PO TABS: 88.0000 ug | ORAL_TABLET | Freq: Every day | ORAL | 3 refills | Status: DC

## 2016-08-19 NOTE — Telephone Encounter (Signed)
Left vm letting pt know lab was normal.

## 2016-08-19 NOTE — Telephone Encounter (Signed)
Order placed for f/u tsh to be drawn at [facility].  

## 2016-08-19 NOTE — Telephone Encounter (Signed)
-----   Message from Lucilla Lame, MD sent at 08/18/2016  8:21 AM EDT ----- Let the patient know that the fecal fat was normal. Consider Viberzi if her Lotronex is not working as long as she still has her gallbladder in.

## 2016-08-21 ENCOUNTER — Encounter: Payer: Self-pay | Admitting: Internal Medicine

## 2016-08-21 DIAGNOSIS — R200 Anesthesia of skin: Secondary | ICD-10-CM | POA: Insufficient documentation

## 2016-08-21 NOTE — Assessment & Plan Note (Signed)
Seeing Dr Toy Care and a counselor.  Medications being adjusted.  Follow.

## 2016-08-21 NOTE — Assessment & Plan Note (Signed)
Persistent.  Seeing Dr Allen Norris.  Will try liquid immodium.

## 2016-08-21 NOTE — Assessment & Plan Note (Signed)
Received iron infusions.  Followed by hematology.  They follow cbc.

## 2016-08-21 NOTE — Assessment & Plan Note (Signed)
Followed by cardiology 

## 2016-08-21 NOTE — Assessment & Plan Note (Signed)
On thyroid replacement.  Follow tsh.  

## 2016-08-21 NOTE — Assessment & Plan Note (Addendum)
Has had extensive GI w/up.  Seeing Dr Allen Norris now.  Has lotrenex to use as needed.  She discussed with Dr Allen Norris.  Recommended liquid immodium.  Continue f/u with Dr Allen Norris.

## 2016-08-21 NOTE — Assessment & Plan Note (Addendum)
Unclear etiology.  No weakness.  Given persistent intermittent numbness and tingling, will refer to neurology for further evaluation and question of need for NCS.

## 2016-08-21 NOTE — Assessment & Plan Note (Signed)
On protonix

## 2016-08-21 NOTE — Assessment & Plan Note (Signed)
Blood pressure under good control.  Continue same medication regimen.  Follow pressures.  Follow metabolic panel.   

## 2016-08-21 NOTE — Assessment & Plan Note (Signed)
s/p septal myectomy.   Followed by Dr Mina Marble - cardiology.  Had the episode of different sensation last night as outlined.  EKG - LBBB.  Has f/u with Dr Mina Marble next week.  Discussed with Dr Mina Marble.  Will fax EKG for his review.  Pt comfortable with plan.

## 2016-09-06 ENCOUNTER — Encounter: Payer: Self-pay | Admitting: Internal Medicine

## 2016-09-06 DIAGNOSIS — R200 Anesthesia of skin: Secondary | ICD-10-CM

## 2016-09-07 NOTE — Telephone Encounter (Signed)
Order placed for referral.  

## 2016-09-15 ENCOUNTER — Ambulatory Visit: Payer: Managed Care, Other (non HMO) | Attending: Internal Medicine

## 2016-09-26 NOTE — Progress Notes (Signed)
[place]  [phone] [phone]  ID: Carol Mahoney OB: June 03, 1975  MR#: [id number]  AOZ#:[id number]  Patient Care Team: Einar Pheasant, MD as PCP - General (Unknown Physician Specialty)  CHIEF COMPLAINT:  Iron deficiency anemia refractory to oral iron therapy.  INTERVAL HISTORY: Patient returns to clinic today for repeat laboratory work and further evaluation. She currently feels well and is asymptomatic. She does not complain of weakness and fatigue today. She has no neurologic complaints. She denies any recent fevers or illnesses. She has a good appetite and denies weight loss. She has no chest pain or shortness of breath. She denies any nausea, vomiting, constipation, or diarrhea. She denies any melena or hematochezia. She has no urinary complaints. Patient offers no further specific complaints.  REVIEW OF SYSTEMS:   Review of Systems  Constitutional: Negative for fever, malaise/fatigue and weight loss.  Respiratory: Negative.  Negative for cough and shortness of breath.   Cardiovascular: Negative.  Negative for chest pain and leg swelling.  Gastrointestinal: Negative.  Negative for abdominal pain, blood in stool and melena.  Genitourinary: Negative.   Musculoskeletal: Negative.   Skin: Negative.  Negative for rash.  Neurological: Negative for weakness.  Psychiatric/Behavioral: Negative.  The patient is not nervous/anxious.     As per HPI. Otherwise, a complete review of systems is negative.  PAST MEDICAL HISTORY: Past Medical History:  Diagnosis Date  . Anemia   . Anxiety   . Depression   . GERD (gastroesophageal reflux disease)   . H/O hiatal hernia   . Hypertension   . Hypertr obst cardiomyop    mitral regurgitation, LAE - followed at [facility]  . Hypothyroidism   . Migraine headache   . PONV (postoperative nausea and vomiting)     PAST SURGICAL HISTORY: Past Surgical History:  Procedure Laterality Date  . BUNIONECTOMY    .  CHOLECYSTECTOMY    . HIATAL HERNIA REPAIR    . IMPLANTABLE CARDIOVERTER DEFIBRILLATOR REVISION  05/21/14   [facility]  . PARTIAL GASTRECTOMY    . TONSILLECTOMY      FAMILY HISTORY: Family History  Problem Relation Age of Onset  . Anesthesia problems Father   . Hypertension Mother   . Hashimoto's thyroiditis Maternal Grandmother   . Hypertension Maternal Grandmother   . Breast cancer Neg Hx   . Colon cancer Neg Hx     ADVANCED DIRECTIVES (Y/N):  N  HEALTH MAINTENANCE: Social History  Substance Use Topics  . Smoking status: Never Smoker  . Smokeless tobacco: Never Used  . Alcohol use No     Colonoscopy:  PAP:  Bone density:  Lipid panel:  Allergies  Allergen Reactions  . Carafate [Sucralfate] Rash and Hives  . Chloraprep One Step [Chlorhexidine Gluconate] Itching and Hives  . Shellfish-Derived Products Swelling  . Vancomycin Itching and Dermatitis  . Penicillins Hives and Rash  . Iodine Rash  . Sulfa Antibiotics Hives and Rash    Current Outpatient Prescriptions  Medication Sig Dispense Refill  . acetaminophen (TYLENOL) 500 MG tablet Take 500 mg by mouth every 6 (six) hours as needed. For headache    . ALPRAZolam (XANAX) 0.5 MG tablet 0.5 mg 3 (three) times daily as needed.     . cyclobenzaprine (FLEXERIL) 5 MG tablet Take 1 tablet (5 mg total) by mouth at bedtime as needed for muscle spasms. 15 tablet 0  . EPINEPHrine (EPIPEN 2-PAK) 0.3 mg/0.3 mL IJ SOAJ injection USE AS DIRECTED ON PACKAGE    . furosemide (  LASIX) 20 MG tablet Take 20 mg by mouth daily as needed.     Marland Kitchen levothyroxine (SYNTHROID, LEVOTHROID) 88 MCG tablet Take 1 tablet (88 mcg total) by mouth daily. 90 tablet 3  . metoprolol tartrate (LOPRESSOR) 25 MG tablet Take 25 mg by mouth 2 (two) times daily. 50mg  in the morning 25 mg at night    . ondansetron (ZOFRAN) 4 MG tablet Take 1 tablet (4 mg total) by mouth 2 (two) times daily as needed for nausea or vomiting. 20 tablet 0  . pantoprazole (PROTONIX) 40 MG  tablet Take 1 tablet (40 mg total) by mouth daily. 90 tablet 3  . SUMAtriptan (IMITREX) 50 MG tablet Take 1 tablet by mouth  every 2 hours as needed for migraine(s) 54 tablet 2  . traZODone (DESYREL) 50 MG tablet 50 mg. Takes 1/2 tablet as needed at bedtime    . vitamin B-12 (CYANOCOBALAMIN) 1000 MCG tablet Take 1 tablet (1,000 mcg total) by mouth daily. 30 tablet 3  . vortioxetine HBr (TRINTELLIX) 10 MG TABS Take by mouth.     No current facility-administered medications for this visit.     OBJECTIVE: Vitals:   09/28/16 1458  BP: 105/67  Pulse: (!) 55  Temp: 98.2 F (36.8 C)     Body mass index is 23.46 kg/m.    ECOG FS:0 - Asymptomatic  General: Well-developed, well-nourished, no acute distress. Eyes: Pink conjunctiva, anicteric sclera. Lungs: Clear to auscultation bilaterally. Heart: Regular rate and rhythm. No rubs, murmurs, or gallops. Abdomen: Soft, nontender, nondistended. No organomegaly noted, normoactive bowel sounds. Musculoskeletal: No edema, cyanosis, or clubbing. Neuro: Alert, answering all questions appropriately. Cranial nerves grossly intact. Skin: No rashes or petechiae noted. Psych: Normal affect.   LAB RESULTS:  Lab Results  Component Value Date   NA 142 08/17/2016   K 3.6 08/17/2016   CL 98 08/17/2016   CO2 30 (H) 08/17/2016   GLUCOSE 86 08/17/2016   BUN 11 08/17/2016   CREATININE 0.73 08/17/2016   CALCIUM 9.3 08/17/2016   PROT 6.4 08/17/2016   ALBUMIN 4.1 08/17/2016   AST 30 08/17/2016   ALT 22 08/17/2016   ALKPHOS 83 08/17/2016   BILITOT 0.8 08/17/2016   GFRNONAA 101 08/17/2016   GFRAA 117 08/17/2016    Lab Results  Component Value Date   WBC 3.5 (L) 09/28/2016   NEUTROABS 1.7 09/28/2016   HGB 12.3 09/28/2016   HCT 34.8 (L) 09/28/2016   MCV 87.1 09/28/2016   PLT 165 09/28/2016   Lab Results  Component Value Date   IRON 72 09/28/2016   TIBC 323 09/28/2016   IRONPCTSAT 22 09/28/2016   Lab Results  Component Value Date    FERRITIN 30 09/28/2016     STUDIES: No results found.  ASSESSMENT: Iron deficiency anemia refractory to oral iron therapy  PLAN:   1.  Iron deficiency anemia refractory to oral iron therapy: Likely secondary to poor absorption given her history of partial gastrectomy. Patient's hemoglobin and iron stores continue to be within normal limits. She does not require additional IV iron today. Patient last received IV Feraheme on April 07, 2016. Return to clinic in 4 months with repeat laboratory work and further evaluation. 2. Thrombocytopenia: Resolved. 3. Leukopenia: Mild, monitor.   Patient expressed understanding and was in agreement with this plan. She also understands that She can call clinic at any time with any questions, concerns, or complaints.    Lloyd Huger, MD   10/03/2016 9:41 AM

## 2016-09-28 ENCOUNTER — Inpatient Hospital Stay: Payer: Managed Care, Other (non HMO)

## 2016-09-28 ENCOUNTER — Encounter: Payer: Self-pay | Admitting: Oncology

## 2016-09-28 ENCOUNTER — Inpatient Hospital Stay: Payer: Managed Care, Other (non HMO) | Attending: Oncology

## 2016-09-28 ENCOUNTER — Inpatient Hospital Stay (HOSPITAL_BASED_OUTPATIENT_CLINIC_OR_DEPARTMENT_OTHER): Payer: Managed Care, Other (non HMO) | Admitting: Oncology

## 2016-09-28 VITALS — BP 105/67 | HR 55 | Temp 98.2°F | Wt 149.8 lb

## 2016-09-28 DIAGNOSIS — F418 Other specified anxiety disorders: Secondary | ICD-10-CM

## 2016-09-28 DIAGNOSIS — I340 Nonrheumatic mitral (valve) insufficiency: Secondary | ICD-10-CM | POA: Insufficient documentation

## 2016-09-28 DIAGNOSIS — I10 Essential (primary) hypertension: Secondary | ICD-10-CM | POA: Insufficient documentation

## 2016-09-28 DIAGNOSIS — D509 Iron deficiency anemia, unspecified: Secondary | ICD-10-CM

## 2016-09-28 DIAGNOSIS — Z79899 Other long term (current) drug therapy: Secondary | ICD-10-CM | POA: Diagnosis not present

## 2016-09-28 DIAGNOSIS — E039 Hypothyroidism, unspecified: Secondary | ICD-10-CM

## 2016-09-28 DIAGNOSIS — K219 Gastro-esophageal reflux disease without esophagitis: Secondary | ICD-10-CM | POA: Diagnosis not present

## 2016-09-28 DIAGNOSIS — K449 Diaphragmatic hernia without obstruction or gangrene: Secondary | ICD-10-CM | POA: Insufficient documentation

## 2016-09-28 DIAGNOSIS — Z8669 Personal history of other diseases of the nervous system and sense organs: Secondary | ICD-10-CM

## 2016-09-28 DIAGNOSIS — D72819 Decreased white blood cell count, unspecified: Secondary | ICD-10-CM | POA: Diagnosis not present

## 2016-09-28 DIAGNOSIS — D508 Other iron deficiency anemias: Secondary | ICD-10-CM

## 2016-09-28 LAB — CBC WITH DIFFERENTIAL/PLATELET
BASOS ABS: 0 10*3/uL (ref 0–0.1)
BASOS PCT: 0 %
EOS PCT: 0 %
Eosinophils Absolute: 0 10*3/uL (ref 0–0.7)
HEMATOCRIT: 34.8 % — AB (ref 35.0–47.0)
Hemoglobin: 12.3 g/dL (ref 12.0–16.0)
Lymphocytes Relative: 44 %
Lymphs Abs: 1.5 10*3/uL (ref 1.0–3.6)
MCH: 30.8 pg (ref 26.0–34.0)
MCHC: 35.3 g/dL (ref 32.0–36.0)
MCV: 87.1 fL (ref 80.0–100.0)
MONOS PCT: 7 %
Monocytes Absolute: 0.2 10*3/uL (ref 0.2–0.9)
NEUTROS ABS: 1.7 10*3/uL (ref 1.4–6.5)
Neutrophils Relative %: 49 %
PLATELETS: 165 10*3/uL (ref 150–440)
RBC: 3.99 MIL/uL (ref 3.80–5.20)
RDW: 13.6 % (ref 11.5–14.5)
WBC: 3.5 10*3/uL — ABNORMAL LOW (ref 3.6–11.0)

## 2016-09-28 LAB — FERRITIN: Ferritin: 30 ng/mL (ref 11–307)

## 2016-09-28 LAB — IRON AND TIBC
Iron: 72 ug/dL (ref 28–170)
Saturation Ratios: 22 % (ref 10.4–31.8)
TIBC: 323 ug/dL (ref 250–450)
UIBC: 251 ug/dL

## 2016-10-22 LAB — TSH: TSH: 1.19 u[IU]/mL (ref 0.450–4.500)

## 2016-10-24 ENCOUNTER — Encounter: Payer: Self-pay | Admitting: Internal Medicine

## 2016-12-02 ENCOUNTER — Encounter: Payer: Self-pay | Admitting: Internal Medicine

## 2016-12-02 ENCOUNTER — Ambulatory Visit (INDEPENDENT_AMBULATORY_CARE_PROVIDER_SITE_OTHER): Payer: Managed Care, Other (non HMO) | Admitting: Internal Medicine

## 2016-12-02 VITALS — BP 108/68 | HR 64 | Temp 98.6°F | Resp 12 | Ht 67.0 in | Wt 151.4 lb

## 2016-12-02 DIAGNOSIS — I10 Essential (primary) hypertension: Secondary | ICD-10-CM | POA: Diagnosis not present

## 2016-12-02 DIAGNOSIS — K580 Irritable bowel syndrome with diarrhea: Secondary | ICD-10-CM | POA: Diagnosis not present

## 2016-12-02 DIAGNOSIS — K219 Gastro-esophageal reflux disease without esophagitis: Secondary | ICD-10-CM

## 2016-12-02 DIAGNOSIS — R200 Anesthesia of skin: Secondary | ICD-10-CM | POA: Diagnosis not present

## 2016-12-02 DIAGNOSIS — E039 Hypothyroidism, unspecified: Secondary | ICD-10-CM

## 2016-12-02 DIAGNOSIS — Z95810 Presence of automatic (implantable) cardiac defibrillator: Secondary | ICD-10-CM | POA: Diagnosis not present

## 2016-12-02 DIAGNOSIS — G43809 Other migraine, not intractable, without status migrainosus: Secondary | ICD-10-CM

## 2016-12-02 DIAGNOSIS — I421 Obstructive hypertrophic cardiomyopathy: Secondary | ICD-10-CM | POA: Diagnosis not present

## 2016-12-02 DIAGNOSIS — D508 Other iron deficiency anemias: Secondary | ICD-10-CM | POA: Diagnosis not present

## 2016-12-02 DIAGNOSIS — F341 Dysthymic disorder: Secondary | ICD-10-CM | POA: Diagnosis not present

## 2016-12-02 DIAGNOSIS — E538 Deficiency of other specified B group vitamins: Secondary | ICD-10-CM

## 2016-12-02 DIAGNOSIS — E559 Vitamin D deficiency, unspecified: Secondary | ICD-10-CM

## 2016-12-02 MED ORDER — CYANOCOBALAMIN 1000 MCG/ML IJ SOLN
1000.0000 ug | Freq: Once | INTRAMUSCULAR | Status: AC
  Administered 2016-12-02: 1000 ug via INTRAMUSCULAR

## 2016-12-02 NOTE — Progress Notes (Signed)
Pre-visit discussion using our clinic review tool. No additional management support is needed unless otherwise documented below in the visit note.  

## 2016-12-02 NOTE — Patient Instructions (Signed)
Vitamin D3 1000 units per day 

## 2016-12-02 NOTE — Progress Notes (Signed)
Patient ID: Carol Mahoney, female   DOB: [birth year], 41 y.o.   MRN: [id number]   Subjective:    Patient ID: Carol Mahoney, female    DOB: [birth year], 41 y.o.   MRN: [id number]  HPI  Patient here for a scheduled follow up.  She reports things are relatively stable.  She has been having cramping, numbness and tingling in bilateral hands and feet.  Seeing neurology now.  W/up in progress.  B12 and vitamin D level low.  Discussed starting B12 injections.  She is also planning to have CT spine and NCS.  She has f/u in 3 months.  Tries to stay active.  No chest pain.  Still some limitations with her breathing.  Seeing Dr Mina Marble her cardiologist.  Had testing.  Reports no changes made.  Sees Dr Grayland Ormond for IV iron.  Last seen 09/2016.  Bowels same.  No improvement.  Seeing Dr Allen Norris.  Was told to take liquid imodium.  Makes her itch.  Unable to take.  Eating.  No nausea or vomiting.  No abdominal pain.  Seeing Dr Toy Care.     Past Medical History:  Diagnosis Date  . Anemia   . Anxiety   . Depression   . GERD (gastroesophageal reflux disease)   . H/O hiatal hernia   . Hypertension   . Hypertr obst cardiomyop    mitral regurgitation, LAE - followed at [facility]  . Hypothyroidism   . Migraine headache   . PONV (postoperative nausea and vomiting)    Past Surgical History:  Procedure Laterality Date  . BUNIONECTOMY    . CHOLECYSTECTOMY    . HIATAL HERNIA REPAIR    . IMPLANTABLE CARDIOVERTER DEFIBRILLATOR REVISION  05/21/14   [facility]  . PARTIAL GASTRECTOMY    . TONSILLECTOMY     Family History  Problem Relation Age of Onset  . Anesthesia problems Father   . Hypertension Mother   . Hashimoto's thyroiditis Maternal Grandmother   . Hypertension Maternal Grandmother   . Breast cancer Neg Hx   . Colon cancer Neg Hx    Social History   Social History  . Marital status: Single    Spouse name: N/A  . Number of children: 1  . Years of education: N/A   Occupational History  . [employer]    Social History Main Topics  . Smoking status: Never Smoker  . Smokeless tobacco: Never Used  . Alcohol use No  . Drug use: No  . Sexual activity: Not Currently   Other Topics Concern  . None   Social History Narrative  . None    Outpatient Encounter Prescriptions as of 12/02/2016  Medication Sig  . acetaminophen (TYLENOL) 500 MG tablet Take 500 mg by mouth every 6 (six) hours as needed. For headache  . ALPRAZolam (XANAX) 0.5 MG tablet 0.5 mg 3 (three) times daily as needed.   . cyclobenzaprine (FLEXERIL) 5 MG tablet Take 1 tablet (5 mg total) by mouth at bedtime as needed for muscle spasms.  Marland Kitchen EPINEPHrine (EPIPEN 2-PAK) 0.3 mg/0.3 mL IJ SOAJ injection USE AS DIRECTED ON PACKAGE  . furosemide (LASIX) 20 MG tablet Take 20 mg by mouth daily as needed.   Marland Kitchen levothyroxine (SYNTHROID, LEVOTHROID) 88 MCG tablet Take 1 tablet (88 mcg total) by mouth daily.  . metoprolol tartrate (LOPRESSOR) 25 MG tablet Take 25 mg by mouth 2 (two) times daily. 50mg  in the morning 25 mg at night  . ondansetron (ZOFRAN) 4 MG tablet Take  1 tablet (4 mg total) by mouth 2 (two) times daily as needed for nausea or vomiting.  . pantoprazole (PROTONIX) 40 MG tablet Take 1 tablet (40 mg total) by mouth daily.  . SUMAtriptan (IMITREX) 50 MG tablet Take 1 tablet by mouth  every 2 hours as needed for migraine(s)  . traZODone (DESYREL) 50 MG tablet 50 mg. Takes 1/2 tablet as needed at bedtime  . vitamin B-12 (CYANOCOBALAMIN) 1000 MCG tablet Take 1 tablet (1,000 mcg total) by mouth daily.  Marland Kitchen vortioxetine HBr (TRINTELLIX) 10 MG TABS Take by mouth.  . [EXPIRED] cyanocobalamin ((VITAMIN B-12)) injection 1,000 mcg    No facility-administered encounter medications on file as of 12/02/2016.     Review of Systems  Constitutional: Negative for appetite change and unexpected weight change.  HENT: Negative for congestion and sinus pressure.   Respiratory: Negative for cough and chest tightness.   Cardiovascular: Negative for  chest pain and palpitations.       Intermittent leg swelling.  Unchanged.    Gastrointestinal: Positive for diarrhea. Negative for abdominal pain, nausea and vomiting.  Genitourinary: Negative for difficulty urinating and dysuria.  Musculoskeletal: Negative for back pain and joint swelling.  Skin: Negative for color change and rash.  Neurological: Negative for dizziness.       Headaches controlled.  Numbness and tingling in feet and hands as outlined.    Psychiatric/Behavioral: Negative for agitation and dysphoric mood.       Objective:    Physical Exam  Constitutional: She appears well-developed and well-nourished. No distress.  HENT:  Nose: Nose normal.  Mouth/Throat: Oropharynx is clear and moist.  Neck: Neck supple. No thyromegaly present.  Cardiovascular: Normal rate and regular rhythm.   Harsh systolic murmur.    Pulmonary/Chest: Breath sounds normal. No respiratory distress. She has no wheezes.  Abdominal: Soft. Bowel sounds are normal. There is no tenderness.  Musculoskeletal: She exhibits no tenderness.  No increased edema.   Lymphadenopathy:    She has no cervical adenopathy.  Skin: No rash noted. No erythema.  Psychiatric: She has a normal mood and affect. Her behavior is normal.    BP 108/68 (BP Location: Left Arm, Patient Position: Sitting, Cuff Size: Normal)   Pulse 64   Temp 98.6 F (37 C) (Oral)   Resp 12   Ht 5\' 7"  (1.702 m)   Wt 151 lb 6.4 oz (68.7 kg)   LMP 10/08/2011   SpO2 98%   BMI 23.71 kg/m  Wt Readings from Last 3 Encounters:  12/02/16 151 lb 6.4 oz (68.7 kg)  09/28/16 149 lb 12.8 oz (67.9 kg)  08/17/16 145 lb 9.6 oz (66 kg)     Lab Results  Component Value Date   WBC 3.5 (L) 09/28/2016   HGB 12.3 09/28/2016   HCT 34.8 (L) 09/28/2016   PLT 165 09/28/2016   GLUCOSE 86 08/17/2016   CHOL 168 08/17/2016   TRIG 65 08/17/2016   HDL 68 08/17/2016   LDLCALC 87 08/17/2016   ALT 22 08/17/2016   AST 30 08/17/2016   NA 142 08/17/2016   K  3.6 08/17/2016   CL 98 08/17/2016   CREATININE 0.73 08/17/2016   BUN 11 08/17/2016   CO2 30 (H) 08/17/2016   TSH 1.190 10/21/2016   INR 1.0 09/26/2014   HGBA1C 4.9 12/12/2014    Mm Digital Screening Bilateral  Result Date: 09/09/2015 CLINICAL DATA:  Screening. EXAM: DIGITAL SCREENING BILATERAL MAMMOGRAM WITH CAD COMPARISON:  Previous exam(s). ACR Breast Density Category c:  The breast tissue is heterogeneously dense, which may obscure small masses. FINDINGS: There are no findings suspicious for malignancy. Images were processed with CAD. IMPRESSION: No mammographic evidence of malignancy. A result letter of this screening mammogram will be mailed directly to the patient. RECOMMENDATION: Screening mammogram in one year. (Code:SM-B-01Y) BI-RADS CATEGORY  1: Negative. Electronically Signed   By: Claudie Revering M.D.   On: 09/09/2015 12:13       Assessment & Plan:   Problem List Items Addressed This Visit    ANXIETY DEPRESSION    Followed by psychiatry - Dr Toy Care.        B12 deficiency    Low B12.  Start B12 injections.  First injection given today.  Start B12 1049mcg q week x 4 weeks and 1044mcg q month.  Will recheck B12 level in 3-4 months.        Relevant Orders   Vitamin B12   GERD (gastroesophageal reflux disease)    Controlled on protonix.        Hand numbness    Seeing neurology.  Undergoing w/up now.  Start B12 injections - 1069mcg q week x 4 and then q month.  Follow B12 level.  Also start vitamin D3 supplementation.  Planning for NCS and CT c-spine.        Hypertension    Blood pressure actually runs on the lower side now.  She takes furosemide prn.  Has intermittent leg swelling.  Controlled.  Follow.       Relevant Orders   Basic metabolic panel   Hypertrophic obstructive cardiomyopathy ([place])    S/p septal myectomy.  Followed by Dr Mina Marble.  Stable.  Just evaluated.  No changes made.       Relevant Orders   Hepatic function panel   Lipid panel   Hypothyroidism     On thyroid replacement.  Follow tsh.       Relevant Orders   TSH   IBS    Has had extensive GI w/up.  Seeing Dr Allen Norris now.  Plans to contact him regarding itching with liquid imodium to discuss other treatment options.        ICD (implantable cardioverter-defibrillator) in place    Followed by cardiology - Dr Mina Marble.       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  Has received iron infusions.  Just evaluated 09/2016.        Relevant Medications   cyanocobalamin ((VITAMIN B-12)) injection 1,000 mcg (Completed)   Other Relevant Orders   CBC with Differential/Platelet   Ferritin   Migraine    Stable.  Follow.       Vitamin D deficiency    Start vitamin D supplements as directedd.        Relevant Orders   VITAMIN D 25 Hydroxy (Vit-D Deficiency, Fractures)    Other Visit Diagnoses    Vitamin B 12 deficiency    -  Primary   Relevant Medications   cyanocobalamin ((VITAMIN B-12)) injection 1,000 mcg (Completed)       Einar Pheasant, MD

## 2016-12-04 ENCOUNTER — Encounter: Payer: Self-pay | Admitting: Internal Medicine

## 2016-12-04 DIAGNOSIS — E559 Vitamin D deficiency, unspecified: Secondary | ICD-10-CM | POA: Insufficient documentation

## 2016-12-04 DIAGNOSIS — E538 Deficiency of other specified B group vitamins: Secondary | ICD-10-CM | POA: Insufficient documentation

## 2016-12-04 NOTE — Assessment & Plan Note (Signed)
Seeing neurology.  Undergoing w/up now.  Start B12 injections - 1053mcg q week x 4 and then q month.  Follow B12 level.  Also start vitamin D3 supplementation.  Planning for NCS and CT c-spine.

## 2016-12-04 NOTE — Assessment & Plan Note (Signed)
On thyroid replacement.  Follow tsh.  

## 2016-12-04 NOTE — Assessment & Plan Note (Signed)
Stable.  Follow.   

## 2016-12-04 NOTE — Assessment & Plan Note (Signed)
Seeing Dr Grayland Ormond.  Has received iron infusions.  Just evaluated 09/2016.

## 2016-12-04 NOTE — Assessment & Plan Note (Signed)
Followed by cardiology - Dr Mina Marble.

## 2016-12-04 NOTE — Assessment & Plan Note (Signed)
Start vitamin D supplements as directedd.

## 2016-12-04 NOTE — Assessment & Plan Note (Signed)
Low B12.  Start B12 injections.  First injection given today.  Start B12 1085mcg q week x 4 weeks and 1034mcg q month.  Will recheck B12 level in 3-4 months.

## 2016-12-04 NOTE — Assessment & Plan Note (Signed)
Has had extensive GI w/up.  Seeing Dr Allen Norris now.  Plans to contact him regarding itching with liquid imodium to discuss other treatment options.

## 2016-12-04 NOTE — Assessment & Plan Note (Signed)
S/p septal myectomy.  Followed by Dr Mina Marble.  Stable.  Just evaluated.  No changes made.

## 2016-12-04 NOTE — Assessment & Plan Note (Signed)
Controlled on protonix.   

## 2016-12-04 NOTE — Assessment & Plan Note (Signed)
Followed by psychiatry - Dr Kaur.   

## 2016-12-04 NOTE — Assessment & Plan Note (Signed)
Blood pressure actually runs on the lower side now.  She takes furosemide prn.  Has intermittent leg swelling.  Controlled.  Follow.

## 2016-12-09 ENCOUNTER — Ambulatory Visit (INDEPENDENT_AMBULATORY_CARE_PROVIDER_SITE_OTHER): Payer: Managed Care, Other (non HMO) | Admitting: *Deleted

## 2016-12-09 ENCOUNTER — Ambulatory Visit: Payer: Managed Care, Other (non HMO)

## 2016-12-09 DIAGNOSIS — E538 Deficiency of other specified B group vitamins: Secondary | ICD-10-CM

## 2016-12-09 MED ORDER — CYANOCOBALAMIN 1000 MCG/ML IJ SOLN
1000.0000 ug | Freq: Once | INTRAMUSCULAR | Status: AC
  Administered 2016-12-09: 1000 ug via INTRAMUSCULAR

## 2016-12-09 NOTE — Progress Notes (Addendum)
Patient presented for B 12 injection to right deltoid, patient voiced no concerns nor showed any signs of distress during injection.  Reviewed.  Dr Scott 

## 2016-12-13 ENCOUNTER — Telehealth: Payer: Self-pay | Admitting: Gastroenterology

## 2016-12-13 NOTE — Telephone Encounter (Signed)
Patient left a voice message that she was told to take Imodium with something else but now has itching. Please call

## 2016-12-14 NOTE — Telephone Encounter (Signed)
Left vm letting pt know she can take up to 16mg  total of imodium daily. Advised to return call if she has any other questions.

## 2016-12-16 ENCOUNTER — Ambulatory Visit (INDEPENDENT_AMBULATORY_CARE_PROVIDER_SITE_OTHER): Payer: Managed Care, Other (non HMO)

## 2016-12-16 DIAGNOSIS — E538 Deficiency of other specified B group vitamins: Secondary | ICD-10-CM

## 2016-12-16 MED ORDER — CYANOCOBALAMIN 1000 MCG/ML IJ SOLN
1000.0000 ug | Freq: Once | INTRAMUSCULAR | Status: AC
Start: 2016-12-16 — End: 2016-12-16
  Administered 2016-12-16: 1000 ug via INTRAMUSCULAR

## 2016-12-16 NOTE — Progress Notes (Signed)
Patient comes in for B 12 injection.  Injected left deltoid.  Patient tolerated injection well.   Reviewed.  Dr Scott 

## 2016-12-23 ENCOUNTER — Ambulatory Visit (INDEPENDENT_AMBULATORY_CARE_PROVIDER_SITE_OTHER): Payer: Managed Care, Other (non HMO)

## 2016-12-23 DIAGNOSIS — E538 Deficiency of other specified B group vitamins: Secondary | ICD-10-CM | POA: Diagnosis not present

## 2016-12-23 MED ORDER — CYANOCOBALAMIN 1000 MCG/ML IJ SOLN
1000.0000 ug | Freq: Once | INTRAMUSCULAR | Status: AC
  Administered 2016-12-23: 1000 ug via INTRAMUSCULAR

## 2016-12-23 NOTE — Progress Notes (Addendum)
Patient comes in today for B12 injection. Injected into right deltoid. Patient tolerated injection well. Patient had no complaints of pain after injection.   Reviewed.  Dr Nicki Reaper

## 2017-01-24 ENCOUNTER — Other Ambulatory Visit: Payer: Self-pay | Admitting: Gastroenterology

## 2017-01-26 ENCOUNTER — Ambulatory Visit (INDEPENDENT_AMBULATORY_CARE_PROVIDER_SITE_OTHER): Payer: Managed Care, Other (non HMO)

## 2017-01-26 DIAGNOSIS — E538 Deficiency of other specified B group vitamins: Secondary | ICD-10-CM

## 2017-01-26 MED ORDER — CYANOCOBALAMIN 1000 MCG/ML IJ SOLN
1000.0000 ug | Freq: Once | INTRAMUSCULAR | Status: DC
  Administered 2017-01-26: 1000 ug via INTRAMUSCULAR

## 2017-01-26 NOTE — Progress Notes (Addendum)
Patient received vitamin B 12 injection. Given in left deltoid. Patient tolerated well.   Reviewed.  Dr Nicki Reaper

## 2017-01-27 ENCOUNTER — Other Ambulatory Visit: Payer: Self-pay

## 2017-01-28 NOTE — Progress Notes (Signed)
[facility]  [phone]) [phone] [phone]) [phone]  ID: [name] OB: 07-23-1975  MR#: [id number]  IHK#:[id number]  Patient Care Team: Einar Pheasant, MD as PCP - General (Unknown Physician Specialty)  CHIEF COMPLAINT:  Iron deficiency anemia refractory to oral iron therapy.  INTERVAL HISTORY: Patient returns to clinic today for repeat laboratory work and further evaluation. She currently feels well and is asymptomatic. She does not complain of weakness and fatigue today. She has no neurologic complaints. She denies any recent fevers or illnesses. She has a good appetite and denies weight loss. She has no chest pain or shortness of breath. She denies any nausea, vomiting, constipation, or diarrhea. She denies any melena or hematochezia. She has no urinary complaints. Patient offers no further specific complaints.  REVIEW OF SYSTEMS:   Review of Systems  Constitutional: Negative for fever, malaise/fatigue and weight loss.  Respiratory: Negative.  Negative for cough and shortness of breath.   Cardiovascular: Negative.  Negative for chest pain and leg swelling.  Gastrointestinal: Negative.  Negative for abdominal pain, blood in stool and melena.  Genitourinary: Negative.   Musculoskeletal: Negative.   Skin: Negative.  Negative for rash.  Neurological: Negative for weakness.  Psychiatric/Behavioral: Negative.  The patient is not nervous/anxious.     As per HPI. Otherwise, a complete review of systems is negative.  PAST MEDICAL HISTORY: Past Medical History:  Diagnosis Date  . Anemia   . Anxiety   . Depression   . GERD (gastroesophageal reflux disease)   . H/O hiatal hernia   . Hypertension   . Hypertr obst cardiomyop    mitral regurgitation, LAE - followed at [facility]  . Hypothyroidism   . Migraine headache   . PONV (postoperative nausea and vomiting)     PAST SURGICAL HISTORY: Past Surgical History:  Procedure Laterality Date  . BUNIONECTOMY    .  CHOLECYSTECTOMY    . HIATAL HERNIA REPAIR    . IMPLANTABLE CARDIOVERTER DEFIBRILLATOR REVISION  05/21/14   [facility]  . PARTIAL GASTRECTOMY    . TONSILLECTOMY      FAMILY HISTORY: Family History  Problem Relation Age of Onset  . Anesthesia problems Father   . Hypertension Mother   . Hashimoto's thyroiditis Maternal Grandmother   . Hypertension Maternal Grandmother   . Breast cancer Neg Hx   . Colon cancer Neg Hx     ADVANCED DIRECTIVES (Y/N):  N  HEALTH MAINTENANCE: Social History  Substance Use Topics  . Smoking status: Never Smoker  . Smokeless tobacco: Never Used  . Alcohol use No     Colonoscopy:  PAP:  Bone density:  Lipid panel:  Allergies  Allergen Reactions  . Carafate [Sucralfate] Rash and Hives  . Chloraprep One Step [Chlorhexidine Gluconate] Itching and Hives  . Shellfish-Derived Products Swelling  . Vancomycin Itching and Dermatitis  . Penicillins Hives and Rash  . Iodine Rash  . Sulfa Antibiotics Hives and Rash    Current Outpatient Prescriptions  Medication Sig Dispense Refill  . acetaminophen (TYLENOL) 500 MG tablet Take 500 mg by mouth every 6 (six) hours as needed. For headache    . ALPRAZolam (XANAX) 0.5 MG tablet 0.5 mg 3 (three) times daily as needed.     . cholecalciferol (VITAMIN D) 1000 units tablet Take 1,000 Units by mouth daily.    . cyclobenzaprine (FLEXERIL) 5 MG tablet Take 1 tablet (5 mg total) by mouth at bedtime as needed for muscle spasms. 15 tablet 0  . EPINEPHrine (EPIPEN 2-PAK)  0.3 mg/0.3 mL IJ SOAJ injection USE AS DIRECTED ON PACKAGE    . furosemide (LASIX) 20 MG tablet Take 20 mg by mouth daily as needed.     Marland Kitchen levothyroxine (SYNTHROID, LEVOTHROID) 88 MCG tablet Take 1 tablet (88 mcg total) by mouth daily. 90 tablet 3  . metoprolol tartrate (LOPRESSOR) 25 MG tablet Take 25 mg by mouth 2 (two) times daily. 50mg  in the morning 25 mg at night    . pantoprazole (PROTONIX) 40 MG tablet Take 1 tablet (40 mg total) by mouth daily. 90  tablet 3  . SUMAtriptan (IMITREX) 50 MG tablet Take 1 tablet by mouth  every 2 hours as needed for migraine(s) 54 tablet 2  . traZODone (DESYREL) 50 MG tablet 50 mg. Takes 1/2 tablet as needed at bedtime    . vitamin B-12 (CYANOCOBALAMIN) 1000 MCG tablet Take 1 tablet (1,000 mcg total) by mouth daily. 30 tablet 3  . vortioxetine HBr (TRINTELLIX) 10 MG TABS Take by mouth.    . ondansetron (ZOFRAN) 4 MG tablet Take 1 tablet (4 mg total) by mouth 2 (two) times daily as needed for nausea or vomiting. (Patient not taking: Reported on 02/01/2017) 20 tablet 0   No current facility-administered medications for this visit.     OBJECTIVE: Vitals:   02/01/17 1416  BP: 93/60  Pulse: (!) 53  Resp: 18  Temp: 97.6 F (36.4 C)     Body mass index is 23.31 kg/m.    ECOG FS:0 - Asymptomatic  General: Well-developed, well-nourished, no acute distress. Eyes: Pink conjunctiva, anicteric sclera. Lungs: Clear to auscultation bilaterally. Heart: Regular rate and rhythm. No rubs, murmurs, or gallops. Abdomen: Soft, nontender, nondistended. No organomegaly noted, normoactive bowel sounds. Musculoskeletal: No edema, cyanosis, or clubbing. Neuro: Alert, answering all questions appropriately. Cranial nerves grossly intact. Skin: No rashes or petechiae noted. Psych: Normal affect.   LAB RESULTS:  Lab Results  Component Value Date   NA 142 08/17/2016   K 3.6 08/17/2016   CL 98 08/17/2016   CO2 30 (H) 08/17/2016   GLUCOSE 86 08/17/2016   BUN 11 08/17/2016   CREATININE 0.73 08/17/2016   CALCIUM 9.3 08/17/2016   PROT 6.4 08/17/2016   ALBUMIN 4.1 08/17/2016   AST 30 08/17/2016   ALT 22 08/17/2016   ALKPHOS 83 08/17/2016   BILITOT 0.8 08/17/2016   GFRNONAA 101 08/17/2016   GFRAA 117 08/17/2016    Lab Results  Component Value Date   WBC 3.7 02/01/2017   NEUTROABS 1.7 02/01/2017   HGB 12.6 02/01/2017   HCT 35.9 02/01/2017   MCV 86.4 02/01/2017   PLT 160 02/01/2017   Lab Results  Component  Value Date   IRON 74 02/01/2017   TIBC 342 02/01/2017   IRONPCTSAT 22 02/01/2017   Lab Results  Component Value Date   FERRITIN 20 02/01/2017     STUDIES: No results found.  ASSESSMENT: Iron deficiency anemia refractory to oral iron therapy  PLAN:   1.  Iron deficiency anemia refractory to oral iron therapy: Likely secondary to poor absorption given her history of partial gastrectomy. Patient's hemoglobin and iron stores continue to be within normal limits. She does not require additional IV iron today. Patient last received IV Feraheme on April 07, 2016. Return to clinic in 4 months with repeat laboratory work and further evaluation. If her laboratory work remains within normal limits at that time, patient can possibly be discharged from clinic. 2. Thrombocytopenia: Resolved. 3. Leukopenia: Resolved.  Approximately 20 minutes  was spent in discussion of which greater than 50% was consultation.   Patient expressed understanding and was in agreement with this plan. She also understands that She can call clinic at any time with any questions, concerns, or complaints.    Lloyd Huger, MD   02/04/2017 12:37 PM

## 2017-02-01 ENCOUNTER — Other Ambulatory Visit: Payer: Self-pay | Admitting: *Deleted

## 2017-02-01 ENCOUNTER — Inpatient Hospital Stay: Payer: Managed Care, Other (non HMO) | Attending: Oncology | Admitting: Oncology

## 2017-02-01 ENCOUNTER — Inpatient Hospital Stay: Payer: Managed Care, Other (non HMO)

## 2017-02-01 VITALS — BP 93/60 | HR 53 | Temp 97.6°F | Resp 18 | Wt 148.8 lb

## 2017-02-01 DIAGNOSIS — I340 Nonrheumatic mitral (valve) insufficiency: Secondary | ICD-10-CM | POA: Diagnosis not present

## 2017-02-01 DIAGNOSIS — Z8669 Personal history of other diseases of the nervous system and sense organs: Secondary | ICD-10-CM | POA: Diagnosis not present

## 2017-02-01 DIAGNOSIS — D508 Other iron deficiency anemias: Secondary | ICD-10-CM

## 2017-02-01 DIAGNOSIS — K449 Diaphragmatic hernia without obstruction or gangrene: Secondary | ICD-10-CM | POA: Diagnosis not present

## 2017-02-01 DIAGNOSIS — F329 Major depressive disorder, single episode, unspecified: Secondary | ICD-10-CM | POA: Insufficient documentation

## 2017-02-01 DIAGNOSIS — I10 Essential (primary) hypertension: Secondary | ICD-10-CM | POA: Diagnosis not present

## 2017-02-01 DIAGNOSIS — K219 Gastro-esophageal reflux disease without esophagitis: Secondary | ICD-10-CM | POA: Diagnosis not present

## 2017-02-01 DIAGNOSIS — F419 Anxiety disorder, unspecified: Secondary | ICD-10-CM

## 2017-02-01 DIAGNOSIS — E039 Hypothyroidism, unspecified: Secondary | ICD-10-CM | POA: Insufficient documentation

## 2017-02-01 DIAGNOSIS — D509 Iron deficiency anemia, unspecified: Secondary | ICD-10-CM | POA: Diagnosis not present

## 2017-02-01 DIAGNOSIS — Z79899 Other long term (current) drug therapy: Secondary | ICD-10-CM | POA: Diagnosis not present

## 2017-02-01 LAB — FERRITIN: Ferritin: 20 ng/mL (ref 11–307)

## 2017-02-01 LAB — CBC WITH DIFFERENTIAL/PLATELET
Basophils Absolute: 0 10*3/uL (ref 0–0.1)
Basophils Relative: 0 %
EOS ABS: 0 10*3/uL (ref 0–0.7)
Eosinophils Relative: 1 %
HCT: 35.9 % (ref 35.0–47.0)
Hemoglobin: 12.6 g/dL (ref 12.0–16.0)
LYMPHS ABS: 1.6 10*3/uL (ref 1.0–3.6)
Lymphocytes Relative: 44 %
MCH: 30.2 pg (ref 26.0–34.0)
MCHC: 35 g/dL (ref 32.0–36.0)
MCV: 86.4 fL (ref 80.0–100.0)
MONOS PCT: 7 %
Monocytes Absolute: 0.3 10*3/uL (ref 0.2–0.9)
Neutro Abs: 1.7 10*3/uL (ref 1.4–6.5)
Neutrophils Relative %: 48 %
Platelets: 160 10*3/uL (ref 150–440)
RBC: 4.16 MIL/uL (ref 3.80–5.20)
RDW: 13.5 % (ref 11.5–14.5)
WBC: 3.7 10*3/uL (ref 3.6–11.0)

## 2017-02-01 LAB — IRON AND TIBC
IRON: 74 ug/dL (ref 28–170)
Saturation Ratios: 22 % (ref 10.4–31.8)
TIBC: 342 ug/dL (ref 250–450)
UIBC: 268 ug/dL

## 2017-02-01 NOTE — Progress Notes (Unsigned)
cbc

## 2017-02-08 DIAGNOSIS — G43119 Migraine with aura, intractable, without status migrainosus: Secondary | ICD-10-CM | POA: Insufficient documentation

## 2017-03-01 ENCOUNTER — Ambulatory Visit (INDEPENDENT_AMBULATORY_CARE_PROVIDER_SITE_OTHER): Payer: Managed Care, Other (non HMO)

## 2017-03-01 DIAGNOSIS — E538 Deficiency of other specified B group vitamins: Secondary | ICD-10-CM | POA: Diagnosis not present

## 2017-03-01 MED ORDER — CYANOCOBALAMIN 1000 MCG/ML IJ SOLN
1000.0000 ug | Freq: Once | INTRAMUSCULAR | Status: AC
  Administered 2017-03-01: 1000 ug via INTRAMUSCULAR

## 2017-03-01 NOTE — Progress Notes (Signed)
Patient comes in for  B 12 injection.  Injected right deltoid , patient tolerated injection well.   Reviewed.  Dr Nicki Reaper

## 2017-03-07 ENCOUNTER — Telehealth: Payer: Self-pay

## 2017-03-07 NOTE — Telephone Encounter (Signed)
Pt called today stated that the liquid and pill form of imodium is causing her itch all over. Is there something else we can add with the Lotronex? She stated she still gets diarrhea even with the Lotronex. Please advise.

## 2017-03-07 NOTE — Telephone Encounter (Signed)
We can add Lomotil to this patient's medications.

## 2017-03-08 ENCOUNTER — Other Ambulatory Visit: Payer: Self-pay

## 2017-03-08 MED ORDER — DIPHENOXYLATE-ATROPINE 2.5-0.025 MG PO TABS: 1.0000 | ORAL_TABLET | Freq: Four times a day (QID) | ORAL | 0 refills | Status: DC | PRN

## 2017-03-08 NOTE — Telephone Encounter (Signed)
Left vm letting pt know the Lotronex 1mg  was approved throught [employer]. Approval is valid until 08/2017.   Also, I have requested pt to return my call to let me know where she would like me to send the Lomotil to.

## 2017-04-07 ENCOUNTER — Ambulatory Visit: Payer: Managed Care, Other (non HMO) | Admitting: Internal Medicine

## 2017-04-07 ENCOUNTER — Other Ambulatory Visit: Payer: Self-pay

## 2017-04-07 ENCOUNTER — Encounter: Payer: Self-pay | Admitting: Internal Medicine

## 2017-04-07 VITALS — BP 112/68 | HR 65 | Temp 97.2°F | Resp 16 | Ht 66.93 in | Wt 150.2 lb

## 2017-04-07 DIAGNOSIS — R200 Anesthesia of skin: Secondary | ICD-10-CM

## 2017-04-07 DIAGNOSIS — D508 Other iron deficiency anemias: Secondary | ICD-10-CM

## 2017-04-07 DIAGNOSIS — R945 Abnormal results of liver function studies: Secondary | ICD-10-CM

## 2017-04-07 DIAGNOSIS — K219 Gastro-esophageal reflux disease without esophagitis: Secondary | ICD-10-CM

## 2017-04-07 DIAGNOSIS — E559 Vitamin D deficiency, unspecified: Secondary | ICD-10-CM

## 2017-04-07 DIAGNOSIS — E039 Hypothyroidism, unspecified: Secondary | ICD-10-CM

## 2017-04-07 DIAGNOSIS — K580 Irritable bowel syndrome with diarrhea: Secondary | ICD-10-CM

## 2017-04-07 DIAGNOSIS — E538 Deficiency of other specified B group vitamins: Secondary | ICD-10-CM | POA: Diagnosis not present

## 2017-04-07 DIAGNOSIS — R7989 Other specified abnormal findings of blood chemistry: Secondary | ICD-10-CM

## 2017-04-07 DIAGNOSIS — Z23 Encounter for immunization: Secondary | ICD-10-CM

## 2017-04-07 DIAGNOSIS — F341 Dysthymic disorder: Secondary | ICD-10-CM

## 2017-04-07 DIAGNOSIS — G43809 Other migraine, not intractable, without status migrainosus: Secondary | ICD-10-CM

## 2017-04-07 DIAGNOSIS — I421 Obstructive hypertrophic cardiomyopathy: Secondary | ICD-10-CM

## 2017-04-07 LAB — BASIC METABOLIC PANEL
BUN/Creatinine Ratio: 12 (ref 9–23)
BUN: 10 mg/dL (ref 6–24)
CALCIUM: 9 mg/dL (ref 8.7–10.2)
CO2: 28 mmol/L (ref 20–29)
Chloride: 103 mmol/L (ref 96–106)
Creatinine, Ser: 0.83 mg/dL (ref 0.57–1.00)
GFR calc Af Amer: 100 mL/min/{1.73_m2} (ref >59)
GFR, EST NON AFRICAN AMERICAN: 87 mL/min/{1.73_m2} (ref >59)
Glucose: 70 mg/dL (ref 65–99)
Potassium: 4.3 mmol/L (ref 3.5–5.2)
Sodium: 142 mmol/L (ref 134–144)

## 2017-04-07 LAB — VITAMIN D 25 HYDROXY (VIT D DEFICIENCY, FRACTURES): VIT D 25 HYDROXY: 33.6 ng/mL (ref 30.0–100.0)

## 2017-04-07 LAB — CBC WITH DIFFERENTIAL/PLATELET
BASOS ABS: 0 10*3/uL (ref 0.0–0.2)
Basos: 1 %
EOS (ABSOLUTE): 0.1 10*3/uL (ref 0.0–0.4)
Eos: 3 %
HEMOGLOBIN: 13.2 g/dL (ref 11.1–15.9)
Hematocrit: 39.9 % (ref 34.0–46.6)
IMMATURE GRANULOCYTES: 0 %
Immature Grans (Abs): 0 10*3/uL (ref 0.0–0.1)
LYMPHS ABS: 2 10*3/uL (ref 0.7–3.1)
LYMPHS: 45 %
MCH: 29.1 pg (ref 26.6–33.0)
MCHC: 33.1 g/dL (ref 31.5–35.7)
MCV: 88 fL (ref 79–97)
MONOCYTES: 6 %
Monocytes Absolute: 0.3 10*3/uL (ref 0.1–0.9)
NEUTROS PCT: 45 %
Neutrophils Absolute: 1.9 10*3/uL (ref 1.4–7.0)
Platelets: 205 10*3/uL (ref 150–379)
RBC: 4.53 x10E6/uL (ref 3.77–5.28)
RDW: 13.5 % (ref 12.3–15.4)
WBC: 4.3 10*3/uL (ref 3.4–10.8)

## 2017-04-07 LAB — HEPATIC FUNCTION PANEL
ALBUMIN: 4.4 g/dL (ref 3.5–5.5)
ALT: 34 IU/L — ABNORMAL HIGH (ref 0–32)
AST: 45 IU/L — ABNORMAL HIGH (ref 0–40)
Alkaline Phosphatase: 93 IU/L (ref 39–117)
BILIRUBIN, DIRECT: 0.21 mg/dL (ref 0.00–0.40)
Bilirubin Total: 0.8 mg/dL (ref 0.0–1.2)
TOTAL PROTEIN: 6.7 g/dL (ref 6.0–8.5)

## 2017-04-07 LAB — VITAMIN B12: Vitamin B-12: >2000 pg/mL — ABNORMAL HIGH (ref 232–1245)

## 2017-04-07 LAB — LIPID PANEL
CHOLESTEROL TOTAL: 165 mg/dL (ref 100–199)
Chol/HDL Ratio: 2.8 ratio (ref 0.0–4.4)
HDL: 60 mg/dL (ref >39)
LDL Calculated: 94 mg/dL (ref 0–99)
TRIGLYCERIDES: 56 mg/dL (ref 0–149)
VLDL CHOLESTEROL CAL: 11 mg/dL (ref 5–40)

## 2017-04-07 LAB — FERRITIN: FERRITIN: 38 ng/mL (ref 15–150)

## 2017-04-07 LAB — TSH: TSH: 2.91 u[IU]/mL (ref 0.450–4.500)

## 2017-04-07 MED ORDER — CYANOCOBALAMIN 1000 MCG/ML IJ SOLN
1000.0000 ug | Freq: Once | INTRAMUSCULAR | Status: AC
  Administered 2017-04-07: 1000 ug via INTRAMUSCULAR

## 2017-04-07 NOTE — Progress Notes (Signed)
Patient ID: Carol Mahoney, female   DOB: 1975/11/18, 41 y.o.   MRN: [id number]   Subjective:    Patient ID: Carol Mahoney, female    DOB: 07/03/1975, 41 y.o.   MRN: [id number]  HPI  Patient here for a scheduled follow up.  She reorts she is doing relatively well.  Increased stress.  Discussed with her today.  She is seeing psychiatry and her counselor.  No chest pain.  Breathing stable.  Just saw cardiology.  No changes made.  She has not been having to take her hctz as much recently.  Discussed labs.  Discussed slightly increased LFTs.  No abdominal pain.  No nausea or vomiting.  Still with increased diarrhea.  Taking lamotil.     Past Medical History:  Diagnosis Date  . Anemia   . Anxiety   . Depression   . GERD (gastroesophageal reflux disease)   . H/O hiatal hernia   . Hypertension   . Hypertr obst cardiomyop    mitral regurgitation, LAE - followed at [facility]  . Hypothyroidism   . Migraine headache   . PONV (postoperative nausea and vomiting)    Past Surgical History:  Procedure Laterality Date  . BUNIONECTOMY    . CHOLECYSTECTOMY    . HIATAL HERNIA REPAIR    . IMPLANTABLE CARDIOVERTER DEFIBRILLATOR REVISION  05/21/14   [facility]  . PARTIAL GASTRECTOMY    . ROBOTIC ASSISTED SALPINGO OOPHORECTOMY N/A 10/29/2011   Performed by Lovenia Kim, MD at [facility] ORS  . ROBOTIC ASSISTED TOTAL HYSTERECTOMY N/A 10/29/2011   Performed by Lovenia Kim, MD at [facility] ORS  . TONSILLECTOMY     Family History  Problem Relation Age of Onset  . Anesthesia problems Father   . Hypertension Mother   . Hashimoto's thyroiditis Maternal Grandmother   . Hypertension Maternal Grandmother   . Breast cancer Neg Hx   . Colon cancer Neg Hx    Social History   Socioeconomic History  . Marital status: Single    Spouse name: None  . Number of children: 1  . Years of education: None  . Highest education level: None  Social Needs  . Financial resource strain: None  . Food insecurity - worry: None  . Food  insecurity - inability: None  . Transportation needs - medical: None  . Transportation needs - non-medical: None  Occupational History  . Occupation: PROGRAM Printmaker: [employer]  Tobacco Use  . Smoking status: Never Smoker  . Smokeless tobacco: Never Used  Substance and Sexual Activity  . Alcohol use: No    Alcohol/week: 0.0 oz  . Drug use: No  . Sexual activity: Not Currently  Other Topics Concern  . None  Social History Narrative  . None    Outpatient Encounter Medications as of 04/07/2017  Medication Sig  . acetaminophen (TYLENOL) 500 MG tablet Take 500 mg by mouth every 6 (six) hours as needed. For headache  . alosetron (LOTRONEX) 1 MG tablet Take 1 mg 2 (two) times daily by mouth.  . ALPRAZolam (XANAX) 0.5 MG tablet 0.5 mg 3 (three) times daily as needed.   . cholecalciferol (VITAMIN D) 1000 units tablet Take 1,000 Units by mouth daily.  . cyclobenzaprine (FLEXERIL) 5 MG tablet Take 1 tablet (5 mg total) by mouth at bedtime as needed for muscle spasms.  . diphenoxylate-atropine (LOMOTIL) 2.5-0.025 MG tablet Take 1 tablet by mouth 4 (four) times daily as needed for diarrhea or loose stools.  Marland Kitchen  EPINEPHrine (EPIPEN 2-PAK) 0.3 mg/0.3 mL IJ SOAJ injection USE AS DIRECTED ON PACKAGE  . furosemide (LASIX) 20 MG tablet Take 20 mg by mouth daily as needed.   Marland Kitchen levothyroxine (SYNTHROID, LEVOTHROID) 88 MCG tablet Take 1 tablet (88 mcg total) by mouth daily.  . metoprolol tartrate (LOPRESSOR) 25 MG tablet Take 25 mg by mouth 2 (two) times daily. 50mg  in the morning 25 mg at night  . ondansetron (ZOFRAN) 4 MG tablet Take 1 tablet (4 mg total) by mouth 2 (two) times daily as needed for nausea or vomiting.  . pantoprazole (PROTONIX) 40 MG tablet Take 1 tablet (40 mg total) by mouth daily.  . SUMAtriptan (IMITREX) 50 MG tablet Take 1 tablet by mouth  every 2 hours as needed for migraine(s)  . traZODone (DESYREL) 50 MG tablet 50 mg. Takes 1/2 tablet as needed at bedtime  . vitamin  B-12 (CYANOCOBALAMIN) 1000 MCG tablet Take 1 tablet (1,000 mcg total) by mouth daily.  Marland Kitchen vortioxetine HBr (TRINTELLIX) 10 MG TABS Take by mouth.  . [EXPIRED] cyanocobalamin ((VITAMIN B-12)) injection 1,000 mcg    No facility-administered encounter medications on file as of 04/07/2017.     Review of Systems  Constitutional: Negative for appetite change and unexpected weight change.  HENT: Negative for congestion.   Respiratory: Negative for cough and chest tightness.        Breathing stable.   Cardiovascular: Negative for chest pain and palpitations.       Leg swelling improved.    Gastrointestinal: Positive for diarrhea. Negative for abdominal pain, nausea and vomiting.  Genitourinary: Negative for difficulty urinating and dysuria.  Musculoskeletal: Negative for joint swelling and myalgias.  Skin: Negative for color change and rash.  Neurological: Negative for dizziness, light-headedness and headaches.  Psychiatric/Behavioral: Negative for agitation and dysphoric mood.       Objective:    Physical Exam  Constitutional: She appears well-developed and well-nourished. No distress.  HENT:  Nose: Nose normal.  Mouth/Throat: Oropharynx is clear and moist.  Neck: Neck supple. No thyromegaly present.  Cardiovascular: Normal rate and regular rhythm.  Pulmonary/Chest: Breath sounds normal. No respiratory distress. She has no wheezes.  Abdominal: Soft. Bowel sounds are normal. There is no tenderness.  Musculoskeletal: She exhibits no tenderness.  No increased edema.   Lymphadenopathy:    She has no cervical adenopathy.  Skin: No rash noted. No erythema.  Psychiatric: She has a normal mood and affect. Her behavior is normal.    BP 112/68 (BP Location: Left Arm, Patient Position: Sitting, Cuff Size: Normal)   Pulse 65   Temp (!) 97.2 F (36.2 C) (Oral)   Resp 16   Ht 5' 6.93" (1.7 m)   Wt 150 lb 3.2 oz (68.1 kg)   LMP 10/08/2011   SpO2 95%   BMI 23.57 kg/m  Wt Readings from  Last 3 Encounters:  04/07/17 150 lb 3.2 oz (68.1 kg)  02/01/17 148 lb 12.8 oz (67.5 kg)  12/02/16 151 lb 6.4 oz (68.7 kg)     Lab Results  Component Value Date   WBC 4.3 04/06/2017   HGB 13.2 04/06/2017   HCT 39.9 04/06/2017   PLT 205 04/06/2017   GLUCOSE 70 04/06/2017   CHOL 165 04/06/2017   TRIG 56 04/06/2017   HDL 60 04/06/2017   LDLCALC 94 04/06/2017   ALT 34 (H) 04/06/2017   AST 45 (H) 04/06/2017   NA 142 04/06/2017   K 4.3 04/06/2017   CL 103 04/06/2017  CREATININE 0.83 04/06/2017   BUN 10 04/06/2017   CO2 28 04/06/2017   TSH 2.910 04/06/2017   INR 1.0 09/26/2014   HGBA1C 4.9 12/12/2014    Mm Digital Screening Bilateral  Result Date: 09/09/2015 CLINICAL DATA:  Screening. EXAM: DIGITAL SCREENING BILATERAL MAMMOGRAM WITH CAD COMPARISON:  Previous exam(s). ACR Breast Density Category c: The breast tissue is heterogeneously dense, which may obscure small masses. FINDINGS: There are no findings suspicious for malignancy. Images were processed with CAD. IMPRESSION: No mammographic evidence of malignancy. A result letter of this screening mammogram will be mailed directly to the patient. RECOMMENDATION: Screening mammogram in one year. (Code:SM-B-01Y) BI-RADS CATEGORY  1: Negative. Electronically Signed   By: Claudie Revering M.D.   On: 09/09/2015 12:13       Assessment & Plan:   Problem List Items Addressed This Visit    ANXIETY DEPRESSION    Followed by psychiatry - Dr Toy Care.        B12 deficiency - Primary    Recent B12 level ok.  Will stop B12 injections.  Oral B12.  Follow.        Relevant Medications   cyanocobalamin ((VITAMIN B-12)) injection 1,000 mcg (Completed)   GERD (gastroesophageal reflux disease)    Controlled on protonix.       Relevant Medications   alosetron (LOTRONEX) 1 MG tablet   Hand numbness    Saw neurology.  Negative NCS.        Hypertrophic obstructive cardiomyopathy (HCC)    S/p septal myectomy.  Followed by Dr Mina Marble.  Just evaluated.   No changes made.  Follow.       Hypothyroidism    On thyroid replacement.  Follow tsh.       IBS    Has had extensive GI w/up.  Taking lomotil.  Follow.       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  Has received iron infusions.  hgb wnl.       Relevant Medications   cyanocobalamin ((VITAMIN B-12)) injection 1,000 mcg (Completed)   Migraine    Stable.  Followed by neurology.        Vitamin D deficiency    Follow vitamin D level.         Other Visit Diagnoses    Need for immunization against influenza       Relevant Orders   Flu Vaccine QUAD 36+ mos IM (Completed)   Abnormal liver function tests       Recheck liver panel.    Relevant Orders   Hepatic function panel       Einar Pheasant, MD

## 2017-04-09 ENCOUNTER — Encounter: Payer: Self-pay | Admitting: Internal Medicine

## 2017-04-09 NOTE — Assessment & Plan Note (Signed)
Followed by Dr Grayland Ormond.  Has received iron infusions.  hgb wnl.

## 2017-04-09 NOTE — Assessment & Plan Note (Signed)
Recent B12 level ok.  Will stop B12 injections.  Oral B12.  Follow.

## 2017-04-09 NOTE — Assessment & Plan Note (Signed)
Follow vitamin D level.  

## 2017-04-09 NOTE — Assessment & Plan Note (Signed)
Followed by psychiatry - Dr Toy Care.

## 2017-04-09 NOTE — Assessment & Plan Note (Signed)
On thyroid replacement.  Follow tsh.  

## 2017-04-09 NOTE — Assessment & Plan Note (Signed)
Saw neurology.  Negative NCS.

## 2017-04-09 NOTE — Assessment & Plan Note (Signed)
Has had extensive GI w/up.  Taking lomotil.  Follow.

## 2017-04-09 NOTE — Assessment & Plan Note (Signed)
S/p septal myectomy.  Followed by Dr Mina Marble.  Just evaluated.  No changes made.  Follow.

## 2017-04-09 NOTE — Assessment & Plan Note (Signed)
Stable  Followed by neurology

## 2017-04-09 NOTE — Assessment & Plan Note (Signed)
Controlled on protonix.   

## 2017-05-13 ENCOUNTER — Telehealth: Payer: Self-pay

## 2017-05-13 ENCOUNTER — Telehealth: Payer: Self-pay | Admitting: Gastroenterology

## 2017-05-13 MED ORDER — DIPHENOXYLATE-ATROPINE 2.5-0.025 MG PO TABS: 1.0000 | ORAL_TABLET | Freq: Four times a day (QID) | ORAL | 0 refills | Status: DC | PRN

## 2017-05-13 NOTE — Telephone Encounter (Signed)
Patient contacted office for a refill on [facility]il [facility].  I informed her that I would call in a [facility] day rx to [facility] and when [facility]ingers back I will make sure we can send the 90 day to [facility].  Thanks [employer]

## 2017-05-13 NOTE — Telephone Encounter (Signed)
Patient left a voice message that Dr. Allen Norris gave her some samples and she needs a rx for them [facility] and one called into [facility]. She stated it was denied and needs to speak to someone about it. Please call

## 2017-05-18 ENCOUNTER — Other Ambulatory Visit: Payer: Self-pay

## 2017-05-18 MED ORDER — ALOSETRON HCL 1 MG PO TABS: 1.0000 mg | ORAL_TABLET | Freq: Two times a day (BID) | ORAL | 3 refills | Status: DC

## 2017-05-18 NOTE — Telephone Encounter (Signed)
Rx for Lomotil has been faxed to [facility].

## 2017-06-03 ENCOUNTER — Other Ambulatory Visit: Payer: Self-pay

## 2017-06-03 DIAGNOSIS — D508 Other iron deficiency anemias: Secondary | ICD-10-CM

## 2017-06-05 NOTE — Progress Notes (Signed)
[place]  [phone] [phone]  ID: Carol Mahoney OB: 17-Jul-1975  MR#: [id number]  KYH#:[id number]  Patient Care Team: Einar Pheasant, MD as PCP - General (Unknown Physician Specialty)  CHIEF COMPLAINT:  Iron deficiency anemia refractory to oral iron therapy.  INTERVAL HISTORY: Patient returns to clinic today for repeat laboratory work and further evaluation. She continues to feel well and is asymptomatic. She does not complain of weakness and fatigue today. She has no neurologic complaints. She denies any recent fevers or illnesses. She has a good appetite and denies weight loss. She has no chest pain or shortness of breath. She denies any nausea, vomiting, constipation, or diarrhea. She denies any melena or hematochezia. She has no urinary complaints. Patient offers no specific complaints today.  REVIEW OF SYSTEMS:   Review of Systems  Constitutional: Negative for fever, malaise/fatigue and weight loss.  Respiratory: Negative.  Negative for cough and shortness of breath.   Cardiovascular: Negative.  Negative for chest pain and leg swelling.  Gastrointestinal: Negative.  Negative for abdominal pain, blood in stool and melena.  Genitourinary: Negative.   Musculoskeletal: Negative.   Skin: Negative.  Negative for rash.  Neurological: Negative for weakness.  Psychiatric/Behavioral: Negative.  The patient is not nervous/anxious.    As per HPI. Otherwise, a complete review of systems is negative.  PAST MEDICAL HISTORY: Past Medical History:  Diagnosis Date  . Anemia   . Anxiety   . Depression   . GERD (gastroesophageal reflux disease)   . H/O hiatal hernia   . Hypertension   . Hypertr obst cardiomyop    mitral regurgitation, LAE - followed at [facility]  . Hypothyroidism   . Migraine headache   . PONV (postoperative nausea and vomiting)     PAST SURGICAL HISTORY: Past Surgical History:  Procedure Laterality Date  . BUNIONECTOMY    .  CHOLECYSTECTOMY    . HIATAL HERNIA REPAIR    . IMPLANTABLE CARDIOVERTER DEFIBRILLATOR REVISION  05/21/14   [facility]  . PARTIAL GASTRECTOMY    . TONSILLECTOMY      FAMILY HISTORY: Family History  Problem Relation Age of Onset  . Anesthesia problems Father   . Hypertension Mother   . Hashimoto's thyroiditis Maternal Grandmother   . Hypertension Maternal Grandmother   . Breast cancer Neg Hx   . Colon cancer Neg Hx     ADVANCED DIRECTIVES (Y/N):  N  HEALTH MAINTENANCE: Social History   Tobacco Use  . Smoking status: Never Smoker  . Smokeless tobacco: Never Used  Substance Use Topics  . Alcohol use: No    Alcohol/week: 0.0 oz  . Drug use: No     Colonoscopy:  PAP:  Bone density:  Lipid panel:  Allergies  Allergen Reactions  . Carafate [Sucralfate] Rash and Hives  . Chloraprep One Step [Chlorhexidine Gluconate] Itching and Hives  . Shellfish-Derived Products Swelling  . Vancomycin Itching and Dermatitis  . Penicillins Hives and Rash  . Iodine Rash  . Sulfa Antibiotics Hives and Rash    Current Outpatient Medications  Medication Sig Dispense Refill  . acetaminophen (TYLENOL) 500 MG tablet Take 500 mg by mouth every 6 (six) hours as needed. For headache    . alosetron (LOTRONEX) 1 MG tablet Take 1 tablet (1 mg total) by mouth 2 (two) times daily. 180 tablet 3  . ALPRAZolam (XANAX) 0.5 MG tablet 0.5 mg 3 (three) times daily as needed.     . cholecalciferol (VITAMIN D) 1000 units tablet Take  1,000 Units by mouth daily.    . cyclobenzaprine (FLEXERIL) 5 MG tablet Take 1 tablet (5 mg total) by mouth at bedtime as needed for muscle spasms. 15 tablet 0  . diphenoxylate-atropine (LOMOTIL) 2.5-0.025 MG tablet Take 1 tablet by mouth 4 (four) times daily as needed for diarrhea or loose stools. 30 tablet 0  . EPINEPHrine (EPIPEN 2-PAK) 0.3 mg/0.3 mL IJ SOAJ injection USE AS DIRECTED ON PACKAGE    . furosemide (LASIX) 20 MG tablet Take 20 mg by mouth daily as needed.     Marland Kitchen  levothyroxine (SYNTHROID, LEVOTHROID) 88 MCG tablet Take 1 tablet (88 mcg total) by mouth daily. 90 tablet 3  . metoprolol tartrate (LOPRESSOR) 25 MG tablet Take 25 mg by mouth 2 (two) times daily. 50mg  in the morning 25 mg at night    . ondansetron (ZOFRAN) 4 MG tablet Take 1 tablet (4 mg total) by mouth 2 (two) times daily as needed for nausea or vomiting. 20 tablet 0  . pantoprazole (PROTONIX) 40 MG tablet Take 1 tablet (40 mg total) by mouth daily. 90 tablet 3  . SUMAtriptan (IMITREX) 50 MG tablet Take 1 tablet by mouth  every 2 hours as needed for migraine(s) 54 tablet 2  . traZODone (DESYREL) 50 MG tablet 50 mg. Takes 1/2 tablet as needed at bedtime    . vitamin B-12 (CYANOCOBALAMIN) 1000 MCG tablet Take 1 tablet (1,000 mcg total) by mouth daily. 30 tablet 3  . vortioxetine HBr (TRINTELLIX) 10 MG TABS Take by mouth.     No current facility-administered medications for this visit.     OBJECTIVE: Vitals:   06/06/17 1354  BP: 100/70  Pulse: (!) 58  Resp: 18  Temp: (!) 97.5 F (36.4 C)     Body mass index is 23.17 kg/m.    ECOG FS:0 - Asymptomatic  General: Well-developed, well-nourished, no acute distress. Eyes: Pink conjunctiva, anicteric sclera. Lungs: Clear to auscultation bilaterally. Heart: Regular rate and rhythm. No rubs, murmurs, or gallops. Abdomen: Soft, nontender, nondistended. No organomegaly noted, normoactive bowel sounds. Musculoskeletal: No edema, cyanosis, or clubbing. Neuro: Alert, answering all questions appropriately. Cranial nerves grossly intact. Skin: No rashes or petechiae noted. Psych: Normal affect.   LAB RESULTS:  Lab Results  Component Value Date   NA 142 04/06/2017   K 4.3 04/06/2017   CL 103 04/06/2017   CO2 28 04/06/2017   GLUCOSE 70 04/06/2017   BUN 10 04/06/2017   CREATININE 0.83 04/06/2017   CALCIUM 9.0 04/06/2017   PROT 6.7 04/06/2017   ALBUMIN 4.4 04/06/2017   AST 45 (H) 04/06/2017   ALT 34 (H) 04/06/2017   ALKPHOS 93 04/06/2017    BILITOT 0.8 04/06/2017   GFRNONAA 87 04/06/2017   GFRAA 100 04/06/2017    Lab Results  Component Value Date   WBC 3.8 06/06/2017   NEUTROABS 1.7 06/06/2017   HGB 12.7 06/06/2017   HCT 36.9 06/06/2017   MCV 86.6 06/06/2017   PLT 161 06/06/2017   Lab Results  Component Value Date   IRON 75 06/06/2017   TIBC 320 06/06/2017   IRONPCTSAT 23 06/06/2017   Lab Results  Component Value Date   FERRITIN 19 06/06/2017     STUDIES: No results found.  ASSESSMENT: Iron deficiency anemia refractory to oral iron therapy  PLAN:   1.  Iron deficiency anemia refractory to oral iron therapy: Likely secondary to poor absorption given her history of partial gastrectomy. Patient's hemoglobin and iron stores continue to be within normal  limits. She does not require additional IV iron today. Patient last received IV Feraheme on April 07, 2016.  After lengthy discussion with the patient, it was agreed upon that no further follow-up is necessary.  Please refer her back if there are any questions or concerns for her hemoglobin begins to trend down.   2. Thrombocytopenia: Resolved. 3. Leukopenia: Resolved.  Approximately 20 minutes was spent in discussion of which greater than 50% was consultation.   Patient expressed understanding and was in agreement with this plan. She also understands that She can call clinic at any time with any questions, concerns, or complaints.    Lloyd Huger, MD   06/08/2017 11:16 AM

## 2017-06-06 ENCOUNTER — Inpatient Hospital Stay: Payer: Managed Care, Other (non HMO)

## 2017-06-06 ENCOUNTER — Inpatient Hospital Stay (HOSPITAL_BASED_OUTPATIENT_CLINIC_OR_DEPARTMENT_OTHER): Payer: Managed Care, Other (non HMO) | Admitting: Oncology

## 2017-06-06 ENCOUNTER — Inpatient Hospital Stay: Payer: Managed Care, Other (non HMO) | Attending: Oncology

## 2017-06-06 VITALS — BP 100/70 | HR 58 | Temp 97.5°F | Resp 18 | Wt 147.6 lb

## 2017-06-06 DIAGNOSIS — D509 Iron deficiency anemia, unspecified: Secondary | ICD-10-CM | POA: Diagnosis not present

## 2017-06-06 DIAGNOSIS — D508 Other iron deficiency anemias: Secondary | ICD-10-CM

## 2017-06-06 LAB — CBC WITH DIFFERENTIAL/PLATELET
Basophils Absolute: 0.1 10*3/uL (ref 0–0.1)
Basophils Relative: 1 %
Eosinophils Absolute: 0.1 10*3/uL (ref 0–0.7)
Eosinophils Relative: 3 %
HEMATOCRIT: 36.9 % (ref 35.0–47.0)
HEMOGLOBIN: 12.7 g/dL (ref 12.0–16.0)
LYMPHS ABS: 1.7 10*3/uL (ref 1.0–3.6)
LYMPHS PCT: 46 %
MCH: 29.7 pg (ref 26.0–34.0)
MCHC: 34.3 g/dL (ref 32.0–36.0)
MCV: 86.6 fL (ref 80.0–100.0)
MONOS PCT: 6 %
Monocytes Absolute: 0.2 10*3/uL (ref 0.2–0.9)
NEUTROS ABS: 1.7 10*3/uL (ref 1.4–6.5)
NEUTROS PCT: 44 %
Platelets: 161 10*3/uL (ref 150–440)
RBC: 4.26 MIL/uL (ref 3.80–5.20)
RDW: 13.7 % (ref 11.5–14.5)
WBC: 3.8 10*3/uL (ref 3.6–11.0)

## 2017-06-06 LAB — IRON AND TIBC
Iron: 75 ug/dL (ref 28–170)
Saturation Ratios: 23 % (ref 10.4–31.8)
TIBC: 320 ug/dL (ref 250–450)
UIBC: 246 ug/dL

## 2017-06-06 LAB — FERRITIN: Ferritin: 19 ng/mL (ref 11–307)

## 2017-06-10 ENCOUNTER — Telehealth: Payer: Self-pay | Admitting: Gastroenterology

## 2017-06-10 NOTE — Telephone Encounter (Signed)
Patient left a voice message that she would like to talk to you regarding the lomotil. Please call

## 2017-06-13 ENCOUNTER — Other Ambulatory Visit: Payer: Self-pay

## 2017-06-13 MED ORDER — DIPHENOXYLATE-ATROPINE 2.5-0.025 MG PO TABS: 1.0000 | ORAL_TABLET | Freq: Four times a day (QID) | ORAL | 3 refills | Status: DC | PRN

## 2017-06-13 NOTE — Progress Notes (Signed)
Rx for Lomotil was called into [facility] per pt request.

## 2017-06-13 NOTE — Telephone Encounter (Signed)
Rx for Lomotil has been sent to [facility] per pt request.

## 2017-06-14 ENCOUNTER — Telehealth: Payer: Self-pay | Admitting: Gastroenterology

## 2017-06-14 ENCOUNTER — Other Ambulatory Visit: Payer: Self-pay

## 2017-06-14 MED ORDER — DIPHENOXYLATE-ATROPINE 2.5-0.025 MG PO TABS: 1.0000 | ORAL_TABLET | Freq: Four times a day (QID) | ORAL | 3 refills | Status: DC | PRN

## 2017-06-14 NOTE — Telephone Encounter (Signed)
Rx for Lomotil has been faxed to [facility] per pt request.

## 2017-06-14 NOTE — Telephone Encounter (Signed)
Please call her 90 day supple of Lomotil to [facility]. Any questions, please call her

## 2017-07-07 ENCOUNTER — Other Ambulatory Visit: Payer: Self-pay | Admitting: Internal Medicine

## 2017-08-06 LAB — HEPATIC FUNCTION PANEL
ALBUMIN: 4 g/dL (ref 3.5–5.5)
ALK PHOS: 76 IU/L (ref 39–117)
ALT: 18 IU/L (ref 0–32)
AST: 28 IU/L (ref 0–40)
BILIRUBIN TOTAL: 0.8 mg/dL (ref 0.0–1.2)
Bilirubin, Direct: 0.22 mg/dL (ref 0.00–0.40)
TOTAL PROTEIN: 6.4 g/dL (ref 6.0–8.5)

## 2017-08-08 ENCOUNTER — Ambulatory Visit: Payer: Managed Care, Other (non HMO) | Admitting: Internal Medicine

## 2017-08-08 ENCOUNTER — Encounter: Payer: Self-pay | Admitting: Internal Medicine

## 2017-08-08 VITALS — BP 114/64 | HR 60 | Temp 98.0°F | Resp 18 | Wt 147.6 lb

## 2017-08-08 DIAGNOSIS — D508 Other iron deficiency anemias: Secondary | ICD-10-CM

## 2017-08-08 DIAGNOSIS — E559 Vitamin D deficiency, unspecified: Secondary | ICD-10-CM

## 2017-08-08 DIAGNOSIS — F341 Dysthymic disorder: Secondary | ICD-10-CM | POA: Diagnosis not present

## 2017-08-08 DIAGNOSIS — Z1231 Encounter for screening mammogram for malignant neoplasm of breast: Secondary | ICD-10-CM

## 2017-08-08 DIAGNOSIS — K219 Gastro-esophageal reflux disease without esophagitis: Secondary | ICD-10-CM | POA: Diagnosis not present

## 2017-08-08 DIAGNOSIS — D696 Thrombocytopenia, unspecified: Secondary | ICD-10-CM

## 2017-08-08 DIAGNOSIS — E039 Hypothyroidism, unspecified: Secondary | ICD-10-CM | POA: Diagnosis not present

## 2017-08-08 DIAGNOSIS — I10 Essential (primary) hypertension: Secondary | ICD-10-CM

## 2017-08-08 DIAGNOSIS — E538 Deficiency of other specified B group vitamins: Secondary | ICD-10-CM | POA: Diagnosis not present

## 2017-08-08 DIAGNOSIS — R197 Diarrhea, unspecified: Secondary | ICD-10-CM

## 2017-08-08 DIAGNOSIS — I421 Obstructive hypertrophic cardiomyopathy: Secondary | ICD-10-CM

## 2017-08-08 DIAGNOSIS — Z1239 Encounter for other screening for malignant neoplasm of breast: Secondary | ICD-10-CM

## 2017-08-08 MED ORDER — CYCLOBENZAPRINE HCL 5 MG PO TABS: 5.0000 mg | ORAL_TABLET | Freq: Every evening | ORAL | 0 refills | Status: DC | PRN

## 2017-08-08 NOTE — Progress Notes (Signed)
Patient ID: Carol Mahoney, female   DOB: 07-21-1975, 42 y.o.   MRN: [id number]   Subjective:    Patient ID: Carol Mahoney, female    DOB: 24-Aug-1975, 42 y.o.   MRN: [id number]  HPI  Patient here for a scheduled follow up.  Has known HOCM s/p prior myectomy.  Followed by Dr Mina Marble at [facility].  Last evaluated 02/21/17.  No changes made. Note reviewed.  Recommended f/u in one year.  Has f/u planned for her defibrillator 08/2017.  Overall feels things are stable.  Still with increased stress.  Discussed with her today.  She is seeing a Social worker.  Her counselor has been out for while, but returns this month.  Plans to call for f/u.  Does not feel she needs anything more at this time.  Breathing stable.  Eating.  Bowels some better with imodium.  Seeing GI.     Past Medical History:  Diagnosis Date  . Anemia   . Anxiety   . Depression   . GERD (gastroesophageal reflux disease)   . H/O hiatal hernia   . Hypertension   . Hypertr obst cardiomyop    mitral regurgitation, LAE - followed at [facility]  . Hypothyroidism   . Migraine headache   . PONV (postoperative nausea and vomiting)    Past Surgical History:  Procedure Laterality Date  . BUNIONECTOMY    . CHOLECYSTECTOMY    . HIATAL HERNIA REPAIR    . IMPLANTABLE CARDIOVERTER DEFIBRILLATOR REVISION  05/21/14   [facility]  . PARTIAL GASTRECTOMY    . TONSILLECTOMY     Family History  Problem Relation Age of Onset  . Anesthesia problems Father   . Hypertension Mother   . Hashimoto's thyroiditis Maternal Grandmother   . Hypertension Maternal Grandmother   . Breast cancer Neg Hx   . Colon cancer Neg Hx    Social History   Socioeconomic History  . Marital status: Single    Spouse name: Not on file  . Number of children: 1  . Years of education: Not on file  . Highest education level: Not on file  Occupational History  . Occupation: PROGRAM Printmaker: [employer]  . Financial resource strain: Not on file  . Food insecurity:    Worry: Not on file    Inability: Not on file  . Transportation needs:    Medical: Not on file    Non-medical: Not on file  Tobacco Use  . Smoking status: Never Smoker  . Smokeless tobacco: Never Used  Substance and Sexual Activity  . Alcohol use: No    Alcohol/week: 0.0 oz  . Drug use: No  . Sexual activity: Not Currently  Lifestyle  . Physical activity:    Days per week: Not on file    Minutes per session: Not on file  . Stress: Not on file  Relationships  . Social connections:    Talks on phone: Not on file    Gets together: Not on file    Attends religious service: Not on file    Active member of club or organization: Not on file    Attends meetings of clubs or organizations: Not on file    Relationship status: Not on file  Other Topics Concern  . Not on file  Social History Narrative  . Not on file    Outpatient Encounter Medications as of 08/08/2017  Medication Sig  . acetaminophen (TYLENOL) 500 MG tablet Take 500 mg by  mouth every 6 (six) hours as needed. For headache  . alosetron (LOTRONEX) 1 MG tablet Take 1 tablet (1 mg total) by mouth 2 (two) times daily.  Marland Kitchen ALPRAZolam (XANAX) 0.5 MG tablet 0.5 mg 3 (three) times daily as needed.   . cholecalciferol (VITAMIN D) 1000 units tablet Take 1,000 Units by mouth daily.  . cyclobenzaprine (FLEXERIL) 5 MG tablet Take 1 tablet (5 mg total) by mouth at bedtime as needed for muscle spasms.  . diphenoxylate-atropine (LOMOTIL) 2.5-0.025 MG tablet Take 1 tablet by mouth 4 (four) times daily as needed for diarrhea or loose stools.  Marland Kitchen EPINEPHrine (EPIPEN 2-PAK) 0.3 mg/0.3 mL IJ SOAJ injection USE AS DIRECTED ON PACKAGE  . furosemide (LASIX) 20 MG tablet Take 20 mg by mouth daily as needed.   Marland Kitchen levothyroxine (SYNTHROID, LEVOTHROID) 88 MCG tablet TAKE 1 TABLET BY MOUTH EVERY DAY  . metoprolol tartrate (LOPRESSOR) 25 MG tablet Take 25 mg by mouth 2 (two) times daily. 50mg  in the morning 25 mg at night  . ondansetron (ZOFRAN) 4 MG  tablet Take 1 tablet (4 mg total) by mouth 2 (two) times daily as needed for nausea or vomiting.  . pantoprazole (PROTONIX) 40 MG tablet Take 1 tablet (40 mg total) by mouth daily.  . SUMAtriptan (IMITREX) 50 MG tablet Take 1 tablet by mouth  every 2 hours as needed for migraine(s)  . traZODone (DESYREL) 50 MG tablet 50 mg. Takes 1/2 tablet as needed at bedtime  . vitamin B-12 (CYANOCOBALAMIN) 1000 MCG tablet Take 1 tablet (1,000 mcg total) by mouth daily.  Marland Kitchen vortioxetine HBr (TRINTELLIX) 10 MG TABS Take by mouth.  . [DISCONTINUED] cyclobenzaprine (FLEXERIL) 5 MG tablet Take 1 tablet (5 mg total) by mouth at bedtime as needed for muscle spasms.   No facility-administered encounter medications on file as of 08/08/2017.     Review of Systems  Constitutional: Negative for appetite change and unexpected weight change.  HENT: Negative for congestion and sinus pressure.   Respiratory: Negative for cough and chest tightness.        Breathing overall stable.   Cardiovascular: Negative for chest pain and palpitations.       Leg swelling currently doing well.    Gastrointestinal: Negative for abdominal pain, nausea and vomiting.       Bowels some better.   Genitourinary: Negative for difficulty urinating and dysuria.  Musculoskeletal: Negative for joint swelling and myalgias.  Skin: Negative for color change and rash.  Neurological: Negative for dizziness, light-headedness and headaches.  Psychiatric/Behavioral: Negative for agitation.       Increased stress as outlined.         Objective:    Physical Exam  Constitutional: She appears well-developed and well-nourished. No distress.  HENT:  Nose: Nose normal.  Mouth/Throat: Oropharynx is clear and moist.  Neck: Neck supple. No thyromegaly present.  Cardiovascular: Normal rate and regular rhythm.  3/6 systolic murmur  Pulmonary/Chest: Breath sounds normal. No respiratory distress. She has no wheezes.  Abdominal: Soft. Bowel sounds are  normal. There is no tenderness.  Musculoskeletal: She exhibits no tenderness.  No increased edema.   Lymphadenopathy:    She has no cervical adenopathy.  Skin: No rash noted. No erythema.  Psychiatric: She has a normal mood and affect. Her behavior is normal.    BP 114/64 (BP Location: Left Arm, Patient Position: Sitting, Cuff Size: Normal)   Pulse 60   Temp 98 F (36.7 C) (Oral)   Resp 18   Wt  147 lb 9.6 oz (67 kg)   LMP 10/08/2011   SpO2 98%   BMI 23.17 kg/m  Wt Readings from Last 3 Encounters:  08/08/17 147 lb 9.6 oz (67 kg)  06/06/17 147 lb 9.6 oz (67 kg)  04/07/17 150 lb 3.2 oz (68.1 kg)     Lab Results  Component Value Date   WBC 3.8 06/06/2017   HGB 12.7 06/06/2017   HCT 36.9 06/06/2017   PLT 161 06/06/2017   GLUCOSE 70 04/06/2017   CHOL 165 04/06/2017   TRIG 56 04/06/2017   HDL 60 04/06/2017   LDLCALC 94 04/06/2017   ALT 18 08/05/2017   AST 28 08/05/2017   NA 142 04/06/2017   K 4.3 04/06/2017   CL 103 04/06/2017   CREATININE 0.83 04/06/2017   BUN 10 04/06/2017   CO2 28 04/06/2017   TSH 2.910 04/06/2017   INR 1.0 09/26/2014   HGBA1C 4.9 12/12/2014    Mm Digital Screening Bilateral  Result Date: 09/09/2015 CLINICAL DATA:  Screening. EXAM: DIGITAL SCREENING BILATERAL MAMMOGRAM WITH CAD COMPARISON:  Previous exam(s). ACR Breast Density Category c: The breast tissue is heterogeneously dense, which may obscure small masses. FINDINGS: There are no findings suspicious for malignancy. Images were processed with CAD. IMPRESSION: No mammographic evidence of malignancy. A result letter of this screening mammogram will be mailed directly to the patient. RECOMMENDATION: Screening mammogram in one year. (Code:SM-B-01Y) BI-RADS CATEGORY  1: Negative. Electronically Signed   By: Claudie Revering M.D.   On: 09/09/2015 12:13       Assessment & Plan:   Problem List Items Addressed This Visit    ANXIETY DEPRESSION    Followed by psychiatry - Dr Toy Care.  Plans to see her  counselor soon.        B12 deficiency    Recheck B12 level with next labs.  Off B12 injections.        Relevant Orders   Vitamin B12   Diarrhea, unspecified    Has had extensive GI w/up.  Seeing Dr Allen Norris.  On immodium.  Has helped.        GERD (gastroesophageal reflux disease)    Controlled on protonix.        Hypertension    Blood pressure under good control.  Continue same medication regimen.  Try to limit furosemide.  Follow.  Follow pressures.  Follow metabolic panel.        Relevant Orders   Basic metabolic panel   Hypertrophic obstructive cardiomyopathy ([place])    S/p septal myectomy.  Followed by Dr Mina Marble.  Last evaluated 02-2017.  No changes made.  Has defibrillator.        Relevant Orders   Hepatic function panel   Lipid panel   Hypothyroidism    On thyroid replacement.  Follow tsh.       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  Has received iron infusions.  Last hgb wnl.  Has been released.  Recheck cbc and ferritin.        Relevant Orders   CBC with Differential/Platelet   Ferritin   Thrombocytopenia (HCC)    Last platelet count wnl.  Recheck cbc.        Vitamin D deficiency    Follow vitamin D level.        Relevant Orders   VITAMIN D 25 Hydroxy (Vit-D Deficiency, Fractures)    Other Visit Diagnoses    Breast cancer screening    -  Primary   Relevant Orders   MM DIGITAL SCREENING BILATERAL       Einar Pheasant, MD

## 2017-08-11 ENCOUNTER — Encounter: Payer: Self-pay | Admitting: Internal Medicine

## 2017-08-11 DIAGNOSIS — D696 Thrombocytopenia, unspecified: Secondary | ICD-10-CM | POA: Insufficient documentation

## 2017-08-11 NOTE — Assessment & Plan Note (Signed)
Has been followed by Dr Grayland Ormond.  Has received iron infusions.  Last hgb wnl.  Has been released.  Recheck cbc and ferritin.

## 2017-08-11 NOTE — Assessment & Plan Note (Signed)
Followed by psychiatry - Dr Toy Care.  Plans to see her counselor soon.

## 2017-08-11 NOTE — Assessment & Plan Note (Signed)
Blood pressure under good control.  Continue same medication regimen.  Try to limit furosemide.  Follow.  Follow pressures.  Follow metabolic panel.

## 2017-08-11 NOTE — Assessment & Plan Note (Signed)
Controlled on protonix.   

## 2017-08-11 NOTE — Assessment & Plan Note (Signed)
S/p septal myectomy.  Followed by Dr Mina Marble.  Last evaluated 02-2017.  No changes made.  Has defibrillator.

## 2017-08-11 NOTE — Assessment & Plan Note (Signed)
Last platelet count wnl.  Recheck cbc.

## 2017-08-11 NOTE — Assessment & Plan Note (Signed)
Recheck B12 level with next labs.  Off B12 injections.

## 2017-08-11 NOTE — Assessment & Plan Note (Signed)
Has had extensive GI w/up.  Seeing Dr Allen Norris.  On immodium.  Has helped.

## 2017-08-11 NOTE — Assessment & Plan Note (Signed)
Follow vitamin D level.  

## 2017-08-11 NOTE — Assessment & Plan Note (Signed)
On thyroid replacement.  Follow tsh.  

## 2017-08-24 DIAGNOSIS — I4729 Other ventricular tachycardia: Secondary | ICD-10-CM | POA: Insufficient documentation

## 2017-08-24 DIAGNOSIS — I472 Ventricular tachycardia: Secondary | ICD-10-CM | POA: Insufficient documentation

## 2017-08-28 ENCOUNTER — Other Ambulatory Visit: Payer: Self-pay | Admitting: Internal Medicine

## 2017-08-30 ENCOUNTER — Ambulatory Visit
Admission: RE | Admit: 2017-08-30 | Discharge: 2017-08-30 | Disposition: A | Discharge status: Home / Self Care | Payer: Managed Care, Other (non HMO) | Source: Ambulatory Visit | Attending: Internal Medicine | Admitting: Internal Medicine

## 2017-08-30 DIAGNOSIS — Z1239 Encounter for other screening for malignant neoplasm of breast: Secondary | ICD-10-CM

## 2017-08-30 DIAGNOSIS — Z1231 Encounter for screening mammogram for malignant neoplasm of breast: Secondary | ICD-10-CM | POA: Diagnosis present

## 2017-09-13 ENCOUNTER — Telehealth: Payer: Self-pay | Admitting: Gastroenterology

## 2017-09-13 NOTE — Telephone Encounter (Signed)
Pt left vm to get refill on rx Lotranex called in to [facility] [phone] she states the rx refill was exspired. She also needs refill on rx Lomodel called in every month to [facility] [address] they do not do refills other then monthly

## 2017-09-15 ENCOUNTER — Other Ambulatory Visit: Payer: Self-pay

## 2017-09-15 DIAGNOSIS — R197 Diarrhea, unspecified: Secondary | ICD-10-CM

## 2017-09-15 MED ORDER — DIPHENOXYLATE-ATROPINE 2.5-0.025 MG PO TABS: 1.0000 | ORAL_TABLET | Freq: Four times a day (QID) | ORAL | 3 refills | Status: DC | PRN

## 2017-09-15 MED ORDER — ALOSETRON HCL 1 MG PO TABS: 1.0000 mg | ORAL_TABLET | Freq: Two times a day (BID) | ORAL | 3 refills | Status: DC

## 2017-09-21 ENCOUNTER — Telehealth: Payer: Self-pay | Admitting: Gastroenterology

## 2017-09-21 NOTE — Telephone Encounter (Signed)
Left vm letting pt know the authorization for the lotronex has been submitted to [facility]. Waiting on approval.

## 2017-09-21 NOTE — Telephone Encounter (Signed)
PT IS CALLING REGARDING PRIOR AUTH RX LOTANEX  TO [facility] PT IS COMPLETELY OUT

## 2017-09-23 ENCOUNTER — Telehealth: Payer: Self-pay | Admitting: Gastroenterology

## 2017-09-23 NOTE — Telephone Encounter (Signed)
Patient LVM that her Lotanex was denied. Please call [facility] [phone]. They need to know if problem presisting? Is medication helping the problem?

## 2017-09-23 NOTE — Telephone Encounter (Signed)
Contacted [facility] to appeal denial for Lotronex. Provided additional information and was given an approval. PA #[id number] valid until 04/05/18. Pt notified.

## 2017-10-04 ENCOUNTER — Other Ambulatory Visit: Payer: Self-pay | Admitting: Internal Medicine

## 2017-12-21 ENCOUNTER — Encounter: Payer: Self-pay | Admitting: Internal Medicine

## 2017-12-21 LAB — CBC WITH DIFFERENTIAL/PLATELET
BASOS: 1 %
Basophils Absolute: 0.1 10*3/uL (ref 0.0–0.2)
EOS (ABSOLUTE): 0.2 10*3/uL (ref 0.0–0.4)
Eos: 4 %
Hematocrit: 38.6 % (ref 34.0–46.6)
Hemoglobin: 13.4 g/dL (ref 11.1–15.9)
IMMATURE GRANULOCYTES: 0 %
Immature Grans (Abs): 0 10*3/uL (ref 0.0–0.1)
Lymphocytes Absolute: 2.2 10*3/uL (ref 0.7–3.1)
Lymphs: 50 %
MCH: 30 pg (ref 26.6–33.0)
MCHC: 34.7 g/dL (ref 31.5–35.7)
MCV: 87 fL (ref 79–97)
MONOS ABS: 0.3 10*3/uL (ref 0.1–0.9)
Monocytes: 7 %
NEUTROS PCT: 38 %
Neutrophils Absolute: 1.7 10*3/uL (ref 1.4–7.0)
PLATELETS: 152 10*3/uL (ref 150–450)
RBC: 4.46 x10E6/uL (ref 3.77–5.28)
RDW: 13.4 % (ref 12.3–15.4)
WBC: 4.4 10*3/uL (ref 3.4–10.8)

## 2017-12-21 LAB — BASIC METABOLIC PANEL
BUN/Creatinine Ratio: 13 (ref 9–23)
BUN: 12 mg/dL (ref 6–24)
CALCIUM: 9.1 mg/dL (ref 8.7–10.2)
CHLORIDE: 103 mmol/L (ref 96–106)
CO2: 27 mmol/L (ref 20–29)
Creatinine, Ser: 0.91 mg/dL (ref 0.57–1.00)
GFR, EST AFRICAN AMERICAN: 89 mL/min/{1.73_m2} (ref >59)
GFR, EST NON AFRICAN AMERICAN: 77 mL/min/{1.73_m2} (ref >59)
Glucose: 85 mg/dL (ref 65–99)
POTASSIUM: 4.3 mmol/L (ref 3.5–5.2)
Sodium: 142 mmol/L (ref 134–144)

## 2017-12-21 LAB — FERRITIN: FERRITIN: 21 ng/mL (ref 15–150)

## 2017-12-21 LAB — HEPATIC FUNCTION PANEL
ALT: 21 IU/L (ref 0–32)
AST: 27 IU/L (ref 0–40)
Albumin: 3.8 g/dL (ref 3.5–5.5)
Alkaline Phosphatase: 75 IU/L (ref 39–117)
Bilirubin Total: 0.5 mg/dL (ref 0.0–1.2)
Bilirubin, Direct: 0.17 mg/dL (ref 0.00–0.40)
Total Protein: 6.1 g/dL (ref 6.0–8.5)

## 2017-12-21 LAB — LIPID PANEL
CHOLESTEROL TOTAL: 151 mg/dL (ref 100–199)
Chol/HDL Ratio: 2.5 ratio (ref 0.0–4.4)
HDL: 61 mg/dL (ref >39)
LDL Calculated: 80 mg/dL (ref 0–99)
Triglycerides: 52 mg/dL (ref 0–149)
VLDL CHOLESTEROL CAL: 10 mg/dL (ref 5–40)

## 2017-12-21 LAB — VITAMIN D 25 HYDROXY (VIT D DEFICIENCY, FRACTURES): Vit D, 25-Hydroxy: 41.3 ng/mL (ref 30.0–100.0)

## 2017-12-21 LAB — VITAMIN B12: Vitamin B-12: >2000 pg/mL — ABNORMAL HIGH (ref 232–1245)

## 2017-12-22 ENCOUNTER — Ambulatory Visit: Payer: Managed Care, Other (non HMO) | Admitting: Internal Medicine

## 2017-12-22 ENCOUNTER — Encounter: Payer: Self-pay | Admitting: Internal Medicine

## 2017-12-22 DIAGNOSIS — I10 Essential (primary) hypertension: Secondary | ICD-10-CM | POA: Diagnosis not present

## 2017-12-22 DIAGNOSIS — D508 Other iron deficiency anemias: Secondary | ICD-10-CM

## 2017-12-22 DIAGNOSIS — G43809 Other migraine, not intractable, without status migrainosus: Secondary | ICD-10-CM

## 2017-12-22 DIAGNOSIS — F341 Dysthymic disorder: Secondary | ICD-10-CM

## 2017-12-22 DIAGNOSIS — K219 Gastro-esophageal reflux disease without esophagitis: Secondary | ICD-10-CM | POA: Diagnosis not present

## 2017-12-22 DIAGNOSIS — E039 Hypothyroidism, unspecified: Secondary | ICD-10-CM

## 2017-12-22 DIAGNOSIS — I421 Obstructive hypertrophic cardiomyopathy: Secondary | ICD-10-CM | POA: Diagnosis not present

## 2017-12-22 DIAGNOSIS — D696 Thrombocytopenia, unspecified: Secondary | ICD-10-CM

## 2017-12-22 DIAGNOSIS — K580 Irritable bowel syndrome with diarrhea: Secondary | ICD-10-CM

## 2017-12-22 DIAGNOSIS — E559 Vitamin D deficiency, unspecified: Secondary | ICD-10-CM

## 2017-12-22 MED ORDER — EPINEPHRINE 0.3 MG/0.3ML IJ SOAJ
INTRAMUSCULAR | 1 refills | Status: DC
Start: 2017-12-22 — End: 2020-04-01

## 2017-12-22 MED ORDER — LEVOTHYROXINE SODIUM 88 MCG PO TABS: 88.0000 ug | ORAL_TABLET | Freq: Every day | ORAL | 1 refills | Status: DC

## 2017-12-22 NOTE — Progress Notes (Signed)
Patient ID: Carol Mahoney, female   DOB: 05-Dec-1975, 42 y.o.   MRN: [id number]   Subjective:    Patient ID: Carol Mahoney, female    DOB: [birth year], 42 y.o.   MRN: [id number]  HPI  Patient here for a scheduled follow up.  She has a history of migraine headaches.  Has been well controlled.  Recently has noticed increased headaches.  Usually she will have visual aura and takes imitrex and resolves.  5 days ago, had headache that did not resolve with one imitrex.  States took 3 imitrex.  Helped.  Still had headache the following day - off and on.  Also noticed mild headache to next two days.  Took imitrex - one per day - those days.  Has not required imitrex the last two days.  Feels better.  States headache felt similar to her other headaches.  Nothing acutely different.  Had the visual aura as usual and no other focal neurological change.  No change in light headedness or dizziness. No chest pain.  Breathing stable.  No acid reflux. No abdominal pain.  Bowels stable.  Increased stress.  Discussed with her today.  Niece living with her now.  Overall she feels she is handling things relatively well.     Past Medical History:  Diagnosis Date  . Anemia   . Anxiety   . Depression   . GERD (gastroesophageal reflux disease)   . H/O hiatal hernia   . Hypertension   . Hypertr obst cardiomyop    mitral regurgitation, LAE - followed at [facility]  . Hypothyroidism   . Migraine headache   . PONV (postoperative nausea and vomiting)    Past Surgical History:  Procedure Laterality Date  . BUNIONECTOMY    . CHOLECYSTECTOMY    . HIATAL HERNIA REPAIR    . IMPLANTABLE CARDIOVERTER DEFIBRILLATOR REVISION  05/21/14   [facility]  . PARTIAL GASTRECTOMY    . TONSILLECTOMY     Family History  Problem Relation Age of Onset  . Anesthesia problems Father   . Hypertension Mother   . Hashimoto's thyroiditis Maternal Grandmother   . Hypertension Maternal Grandmother   . Breast cancer Neg Hx   . Colon cancer Neg Hx     Social History   Socioeconomic History  . Marital status: Single    Spouse name: Not on file  . Number of children: 1  . Years of education: Not on file  . Highest education level: Not on file  Occupational History  . Occupation: PROGRAM Printmaker: [employer]  . Financial resource strain: Not on file  . Food insecurity:    Worry: Not on file    Inability: Not on file  . Transportation needs:    Medical: Not on file    Non-medical: Not on file  Tobacco Use  . Smoking status: Never Smoker  . Smokeless tobacco: Never Used  Substance and Sexual Activity  . Alcohol use: No    Alcohol/week: 0.0 oz  . Drug use: No  . Sexual activity: Not Currently  Lifestyle  . Physical activity:    Days per week: Not on file    Minutes per session: Not on file  . Stress: Not on file  Relationships  . Social connections:    Talks on phone: Not on file    Gets together: Not on file    Attends religious service: Not on file    Active member of club or  organization: Not on file    Attends meetings of clubs or organizations: Not on file    Relationship status: Not on file  Other Topics Concern  . Not on file  Social History Narrative  . Not on file    Outpatient Encounter Medications as of 12/22/2017  Medication Sig  . acetaminophen (TYLENOL) 500 MG tablet Take 500 mg by mouth every 6 (six) hours as needed. For headache  . alosetron (LOTRONEX) 1 MG tablet Take 1 tablet (1 mg total) by mouth 2 (two) times daily.  Marland Kitchen ALPRAZolam (XANAX) 0.5 MG tablet 0.5 mg 3 (three) times daily as needed.   . cholecalciferol (VITAMIN D) 1000 units tablet Take 1,000 Units by mouth daily.  . cyanocobalamin ([facility] VITAMIN B12) 1000 MCG tablet Take by mouth.  . cyclobenzaprine (FLEXERIL) 5 MG tablet Take 1 tablet (5 mg total) by mouth at bedtime as needed for muscle spasms.  . diphenoxylate-atropine (LOMOTIL) 2.5-0.025 MG tablet Take by mouth.  . EPINEPHrine (EPIPEN 2-PAK) 0.3 mg/0.3 mL IJ  SOAJ injection USE AS DIRECTED ON PACKAGE  . furosemide (LASIX) 20 MG tablet Take 20 mg by mouth daily as needed.   Marland Kitchen levothyroxine (SYNTHROID, LEVOTHROID) 88 MCG tablet Take 1 tablet (88 mcg total) by mouth daily.  . metoprolol tartrate (LOPRESSOR) 25 MG tablet Take 25 mg by mouth 2 (two) times daily. 50mg  in the morning 25 mg at night  . ondansetron (ZOFRAN) 4 MG tablet Take 1 tablet (4 mg total) by mouth 2 (two) times daily as needed for nausea or vomiting.  . pantoprazole (PROTONIX) 40 MG tablet TAKE 1 TABLET BY MOUTH  DAILY  . SUMAtriptan (IMITREX) 50 MG tablet Take 1 tablet by mouth  every 2 hours as needed for migraine(s)  . traZODone (DESYREL) 50 MG tablet 50 mg. Takes 1/2 tablet as needed at bedtime  . vitamin B-12 (CYANOCOBALAMIN) 1000 MCG tablet Take 1 tablet (1,000 mcg total) by mouth daily.  Marland Kitchen vortioxetine HBr (TRINTELLIX) 10 MG TABS Take by mouth.  . [DISCONTINUED] EPINEPHrine (EPIPEN 2-PAK) 0.3 mg/0.3 mL IJ SOAJ injection USE AS DIRECTED ON PACKAGE  . [DISCONTINUED] levothyroxine (SYNTHROID, LEVOTHROID) 88 MCG tablet TAKE 1 TABLET BY MOUTH EVERY DAY  . diphenoxylate-atropine (LOMOTIL) 2.5-0.025 MG tablet Take 1 tablet by mouth 4 (four) times daily as needed for diarrhea or loose stools.   No facility-administered encounter medications on file as of 12/22/2017.     Review of Systems  Constitutional: Negative for appetite change and unexpected weight change.  HENT: Negative for congestion and sinus pressure.   Respiratory: Negative for cough, chest tightness and shortness of breath.   Cardiovascular: Negative for chest pain and palpitations.       Leg swelling has improved.    Gastrointestinal: Negative for abdominal pain, nausea and vomiting.  Genitourinary: Negative for difficulty urinating and dysuria.  Musculoskeletal: Negative for joint swelling and myalgias.  Skin: Negative for color change and rash.  Neurological: Positive for light-headedness and headaches.   Psychiatric/Behavioral: Negative for agitation and dysphoric mood.       Objective:     Blood pressure rechecked by me:  94/60  Physical Exam  Constitutional: She appears well-developed and well-nourished. No distress.  HENT:  Nose: Nose normal.  Mouth/Throat: Oropharynx is clear and moist.  Neck: Neck supple. No thyromegaly present.  Cardiovascular: Normal rate and regular rhythm.  Pulmonary/Chest: Breath sounds normal. No respiratory distress. She has no wheezes.  Abdominal: Soft. Bowel sounds are normal. There is no tenderness.  Musculoskeletal: She exhibits no edema or tenderness.  Lymphadenopathy:    She has no cervical adenopathy.  Skin: No rash noted. No erythema.  Psychiatric: She has a normal mood and affect. Her behavior is normal.    BP (!) 84/60 (BP Location: Left Arm, Patient Position: Sitting, Cuff Size: Normal)   Pulse (!) 56   Temp (!) 97.4 F (36.3 C) (Oral)   Resp 16   Ht 5\' 7"  (1.702 m)   Wt 142 lb 4 oz (64.5 kg)   LMP 10/08/2011   SpO2 95%   BMI 22.28 kg/m  Wt Readings from Last 3 Encounters:  12/22/17 142 lb 4 oz (64.5 kg)  08/08/17 147 lb 9.6 oz (67 kg)  06/06/17 147 lb 9.6 oz (67 kg)     Lab Results  Component Value Date   WBC 4.4 12/20/2017   HGB 13.4 12/20/2017   HCT 38.6 12/20/2017   PLT 152 12/20/2017   GLUCOSE 85 12/20/2017   CHOL 151 12/20/2017   TRIG 52 12/20/2017   HDL 61 12/20/2017   LDLCALC 80 12/20/2017   ALT 21 12/20/2017   AST 27 12/20/2017   NA 142 12/20/2017   K 4.3 12/20/2017   CL 103 12/20/2017   CREATININE 0.91 12/20/2017   BUN 12 12/20/2017   CO2 27 12/20/2017   TSH 2.910 04/06/2017   INR 1.0 09/26/2014   HGBA1C 4.9 12/12/2014    Mm 3d Screen Breast Bilateral  Result Date: 08/31/2017 CLINICAL DATA:  Screening. EXAM: DIGITAL SCREENING BILATERAL MAMMOGRAM WITH TOMO AND CAD COMPARISON:  Previous exam(s). ACR Breast Density Category c: The breast tissue is heterogeneously dense, which may obscure small masses.  FINDINGS: There are no findings suspicious for malignancy. Images were processed with CAD. IMPRESSION: No mammographic evidence of malignancy. A result letter of this screening mammogram will be mailed directly to the patient. RECOMMENDATION: Screening mammogram in one year. (Code:SM-B-01Y) BI-RADS CATEGORY  1: Negative. Electronically Signed   By: Nolon Nations M.D.   On: 08/31/2017 10:21       Assessment & Plan:   Problem List Items Addressed This Visit    ANXIETY DEPRESSION    Followed by psychiatry - Dr Toy Care.  Also sees a Social worker.  Overall she feels she is handling things relatively well.        GERD (gastroesophageal reflux disease)    Controlled on current regimen.        Hypertension    She has to take furosemide prn.  Tries to limit.  Blood pressures runs low now.  Followed by cardiology.  Overall she feels is stable.        Relevant Medications   EPINEPHrine (EPIPEN 2-PAK) 0.3 mg/0.3 mL IJ SOAJ injection   Hypertrophic obstructive cardiomyopathy (HCC)    S/p septal myectomy.  Followed by Dr Mina Marble.  Has defibrillator.  Stable.        Relevant Medications   EPINEPHrine (EPIPEN 2-PAK) 0.3 mg/0.3 mL IJ SOAJ injection   Hypothyroidism    On thyroid replacement.  Follow tsh.       Relevant Medications   levothyroxine (SYNTHROID, LEVOTHROID) 88 MCG tablet   IBS    Has had extensive w/up.  Stable.       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  Has received iron infusions.  Last hgb wnl.  Follow.        Migraine    Has seen neurology.  Has migraine headaches.  Recently some increase as outlined.  No specific trigger.  Better now. Discussed further w/up.  She wants to monitor.  Will follow.        Relevant Medications   EPINEPHrine (EPIPEN 2-PAK) 0.3 mg/0.3 mL IJ SOAJ injection   Thrombocytopenia (HCC)    Last platelet count wnl.       Vitamin D deficiency    Follow vitamin D level.  Vitamin D level 12/20/17 - wnl.             Einar Pheasant, MD

## 2017-12-25 ENCOUNTER — Encounter: Payer: Self-pay | Admitting: Internal Medicine

## 2017-12-25 NOTE — Assessment & Plan Note (Signed)
Last platelet count wnl.   

## 2017-12-25 NOTE — Assessment & Plan Note (Signed)
Controlled on current regimen.   

## 2017-12-25 NOTE — Assessment & Plan Note (Signed)
On thyroid replacement.  Follow tsh.  

## 2017-12-25 NOTE — Assessment & Plan Note (Addendum)
Follow vitamin D level.  Vitamin D level 12/20/17 - wnl.

## 2017-12-25 NOTE — Assessment & Plan Note (Signed)
Has had extensive w/up.  Stable.

## 2017-12-25 NOTE — Assessment & Plan Note (Signed)
She has to take furosemide prn.  Tries to limit.  Blood pressures runs low now.  Followed by cardiology.  Overall she feels is stable.

## 2017-12-25 NOTE — Assessment & Plan Note (Signed)
S/p septal myectomy.  Followed by Dr Mina Marble.  Has defibrillator.  Stable.

## 2017-12-25 NOTE — Assessment & Plan Note (Signed)
Has been followed by Dr Grayland Ormond.  Has received iron infusions.  Last hgb wnl.  Follow.

## 2017-12-25 NOTE — Assessment & Plan Note (Signed)
Followed by psychiatry - Dr Toy Care.  Also sees a Social worker.  Overall she feels she is handling things relatively well.

## 2017-12-25 NOTE — Assessment & Plan Note (Signed)
Has seen neurology.  Has migraine headaches.  Recently some increase as outlined.  No specific trigger.  Better now. Discussed further w/up.  She wants to monitor.  Will follow.

## 2018-04-03 ENCOUNTER — Telehealth: Payer: Self-pay | Admitting: Internal Medicine

## 2018-04-03 DIAGNOSIS — E039 Hypothyroidism, unspecified: Secondary | ICD-10-CM

## 2018-04-03 DIAGNOSIS — I10 Essential (primary) hypertension: Secondary | ICD-10-CM

## 2018-04-03 DIAGNOSIS — D508 Other iron deficiency anemias: Secondary | ICD-10-CM

## 2018-04-03 NOTE — Telephone Encounter (Signed)
Patient needs labs for [facility] she is there waiting.

## 2018-04-03 NOTE — Telephone Encounter (Signed)
Orders placed for [facility] labs.

## 2018-04-04 LAB — HEPATIC FUNCTION PANEL
ALK PHOS: 76 IU/L (ref 39–117)
ALT: 19 IU/L (ref 0–32)
AST: 27 IU/L (ref 0–40)
Albumin: 3.9 g/dL (ref 3.5–5.5)
BILIRUBIN TOTAL: 0.7 mg/dL (ref 0.0–1.2)
Bilirubin, Direct: 0.18 mg/dL (ref 0.00–0.40)
TOTAL PROTEIN: 6.1 g/dL (ref 6.0–8.5)

## 2018-04-04 LAB — CBC WITH DIFFERENTIAL/PLATELET
BASOS: 1 %
Basophils Absolute: 0.1 10*3/uL (ref 0.0–0.2)
EOS (ABSOLUTE): 0.1 10*3/uL (ref 0.0–0.4)
EOS: 3 %
HEMATOCRIT: 36.3 % (ref 34.0–46.6)
Hemoglobin: 12.8 g/dL (ref 11.1–15.9)
Immature Grans (Abs): 0 10*3/uL (ref 0.0–0.1)
Immature Granulocytes: 0 %
Lymphocytes Absolute: 1.9 10*3/uL (ref 0.7–3.1)
Lymphs: 47 %
MCH: 29.6 pg (ref 26.6–33.0)
MCHC: 35.3 g/dL (ref 31.5–35.7)
MCV: 84 fL (ref 79–97)
MONOCYTES: 7 %
MONOS ABS: 0.3 10*3/uL (ref 0.1–0.9)
NEUTROS PCT: 42 %
Neutrophils Absolute: 1.7 10*3/uL (ref 1.4–7.0)
PLATELETS: 157 10*3/uL (ref 150–450)
RBC: 4.33 x10E6/uL (ref 3.77–5.28)
RDW: 12.9 % (ref 12.3–15.4)
WBC: 4 10*3/uL (ref 3.4–10.8)

## 2018-04-04 LAB — BASIC METABOLIC PANEL
BUN / CREAT RATIO: 15 (ref 9–23)
BUN: 13 mg/dL (ref 6–24)
CO2: 25 mmol/L (ref 20–29)
Calcium: 9 mg/dL (ref 8.7–10.2)
Chloride: 104 mmol/L (ref 96–106)
Creatinine, Ser: 0.88 mg/dL (ref 0.57–1.00)
GFR calc non Af Amer: 80 mL/min/{1.73_m2} (ref >59)
GFR, EST AFRICAN AMERICAN: 92 mL/min/{1.73_m2} (ref >59)
GLUCOSE: 73 mg/dL (ref 65–99)
POTASSIUM: 4.3 mmol/L (ref 3.5–5.2)
Sodium: 143 mmol/L (ref 134–144)

## 2018-04-04 LAB — IRON AND TIBC
Iron Saturation: 36 % (ref 15–55)
Iron: 121 ug/dL (ref 27–159)
Total Iron Binding Capacity: 334 ug/dL (ref 250–450)
UIBC: 213 ug/dL (ref 131–425)

## 2018-04-04 LAB — LIPID PANEL
CHOL/HDL RATIO: 2.6 ratio (ref 0.0–4.4)
Cholesterol, Total: 155 mg/dL (ref 100–199)
HDL: 60 mg/dL (ref >39)
LDL CALC: 86 mg/dL (ref 0–99)
TRIGLYCERIDES: 43 mg/dL (ref 0–149)
VLDL Cholesterol Cal: 9 mg/dL (ref 5–40)

## 2018-04-04 LAB — TSH: TSH: 34.11 u[IU]/mL — ABNORMAL HIGH (ref 0.450–4.500)

## 2018-04-04 LAB — FERRITIN: Ferritin: 20 ng/mL (ref 15–150)

## 2018-04-05 ENCOUNTER — Encounter: Payer: Self-pay | Admitting: Internal Medicine

## 2018-04-05 ENCOUNTER — Ambulatory Visit: Payer: Managed Care, Other (non HMO) | Admitting: Internal Medicine

## 2018-04-05 DIAGNOSIS — I10 Essential (primary) hypertension: Secondary | ICD-10-CM

## 2018-04-05 DIAGNOSIS — D696 Thrombocytopenia, unspecified: Secondary | ICD-10-CM

## 2018-04-05 DIAGNOSIS — E559 Vitamin D deficiency, unspecified: Secondary | ICD-10-CM

## 2018-04-05 DIAGNOSIS — D508 Other iron deficiency anemias: Secondary | ICD-10-CM

## 2018-04-05 DIAGNOSIS — Z95810 Presence of automatic (implantable) cardiac defibrillator: Secondary | ICD-10-CM

## 2018-04-05 DIAGNOSIS — I472 Ventricular tachycardia: Secondary | ICD-10-CM

## 2018-04-05 DIAGNOSIS — K219 Gastro-esophageal reflux disease without esophagitis: Secondary | ICD-10-CM | POA: Diagnosis not present

## 2018-04-05 DIAGNOSIS — K580 Irritable bowel syndrome with diarrhea: Secondary | ICD-10-CM

## 2018-04-05 DIAGNOSIS — I421 Obstructive hypertrophic cardiomyopathy: Secondary | ICD-10-CM

## 2018-04-05 DIAGNOSIS — I4729 Other ventricular tachycardia: Secondary | ICD-10-CM

## 2018-04-05 DIAGNOSIS — E039 Hypothyroidism, unspecified: Secondary | ICD-10-CM

## 2018-04-05 NOTE — Progress Notes (Signed)
Patient ID: Carol Mahoney, female   DOB: [birth year], 42 y.o.   MRN: [id number]   Subjective:    Patient ID: Carol Mahoney, female    DOB: 04-07-1976, 42 y.o.   MRN: [id number]  HPI  Patient here for a scheduled follow up.  Increased stress.  Discussed with her today.  She is seeing psychiatry and a Social worker.  Does not feel she needs any further intervention.  Just saw her cardiologist.  No changes made.  Note reviewed.  No chest pain.  Breathing stable.  No acid reflux.  Bowels stable.  Still with loose stools, but stable.  TSH elevated.  She has not been taking her thyroid medication regularly.  States misses probably half the time.  Discussed other labs.     Past Medical History:  Diagnosis Date  . Anemia   . Anxiety   . Depression   . GERD (gastroesophageal reflux disease)   . H/O hiatal hernia   . Hypertension   . Hypertr obst cardiomyop    mitral regurgitation, LAE - followed at [facility]  . Hypothyroidism   . Migraine headache   . PONV (postoperative nausea and vomiting)    Past Surgical History:  Procedure Laterality Date  . BUNIONECTOMY    . CHOLECYSTECTOMY    . HIATAL HERNIA REPAIR    . IMPLANTABLE CARDIOVERTER DEFIBRILLATOR REVISION  05/21/14   [facility]  . PARTIAL GASTRECTOMY    . TONSILLECTOMY     Family History  Problem Relation Age of Onset  . Anesthesia problems Father   . Hypertension Mother   . Hashimoto's thyroiditis Maternal Grandmother   . Hypertension Maternal Grandmother   . Breast cancer Neg Hx   . Colon cancer Neg Hx    Social History   Socioeconomic History  . Marital status: Single    Spouse name: Not on file  . Number of children: 1  . Years of education: Not on file  . Highest education level: Not on file  Occupational History  . Occupation: PROGRAM Printmaker: [employer]  . Financial resource strain: Not on file  . Food insecurity:    Worry: Not on file    Inability: Not on file  . Transportation needs:    Medical: Not  on file    Non-medical: Not on file  Tobacco Use  . Smoking status: Never Smoker  . Smokeless tobacco: Never Used  Substance and Sexual Activity  . Alcohol use: No    Alcohol/week: 0.0 standard drinks  . Drug use: No  . Sexual activity: Not Currently  Lifestyle  . Physical activity:    Days per week: Not on file    Minutes per session: Not on file  . Stress: Not on file  Relationships  . Social connections:    Talks on phone: Not on file    Gets together: Not on file    Attends religious service: Not on file    Active member of club or organization: Not on file    Attends meetings of clubs or organizations: Not on file    Relationship status: Not on file  Other Topics Concern  . Not on file  Social History Narrative  . Not on file    Outpatient Encounter Medications as of 04/05/2018  Medication Sig  . acetaminophen (TYLENOL) 500 MG tablet Take 500 mg by mouth every 6 (six) hours as needed. For headache  . alosetron (LOTRONEX) 1 MG tablet Take 1 tablet (  1 mg total) by mouth 2 (two) times daily.  Marland Kitchen ALPRAZolam (XANAX) 0.5 MG tablet 0.5 mg 3 (three) times daily as needed.   . cyanocobalamin ([facility] VITAMIN B12) 1000 MCG tablet Take by mouth.  . cyclobenzaprine (FLEXERIL) 5 MG tablet Take 1 tablet (5 mg total) by mouth at bedtime as needed for muscle spasms.  Marland Kitchen EPINEPHrine (EPIPEN 2-PAK) 0.3 mg/0.3 mL IJ SOAJ injection USE AS DIRECTED ON PACKAGE  . furosemide (LASIX) 20 MG tablet Take 20 mg by mouth daily as needed.   Marland Kitchen levothyroxine (SYNTHROID, LEVOTHROID) 88 MCG tablet Take 1 tablet (88 mcg total) by mouth daily.  . metoprolol tartrate (LOPRESSOR) 25 MG tablet Take 25 mg by mouth 2 (two) times daily. 50mg  in the morning 25 mg at night  . ondansetron (ZOFRAN) 4 MG tablet Take 1 tablet (4 mg total) by mouth 2 (two) times daily as needed for nausea or vomiting.  . pantoprazole (PROTONIX) 40 MG tablet TAKE 1 TABLET BY MOUTH  DAILY  . SUMAtriptan (IMITREX) 50 MG tablet Take 1 tablet by  mouth  every 2 hours as needed for migraine(s)  . traZODone (DESYREL) 50 MG tablet 50 mg. Takes 1/2 tablet as needed at bedtime  . vitamin B-12 (CYANOCOBALAMIN) 1000 MCG tablet Take 1 tablet (1,000 mcg total) by mouth daily.  Marland Kitchen vortioxetine HBr (TRINTELLIX) 10 MG TABS Take by mouth.  . [DISCONTINUED] cholecalciferol (VITAMIN D) 1000 units tablet Take 1,000 Units by mouth daily.  . diphenoxylate-atropine (LOMOTIL) 2.5-0.025 MG tablet Take 1 tablet by mouth 4 (four) times daily as needed for diarrhea or loose stools.  . [DISCONTINUED] diphenoxylate-atropine (LOMOTIL) 2.5-0.025 MG tablet Take by mouth.   No facility-administered encounter medications on file as of 04/05/2018.     Review of Systems  Constitutional: Negative for appetite change and unexpected weight change.  HENT: Negative for congestion and sinus pressure.   Respiratory: Negative for cough and chest tightness.        Breathing stable.    Cardiovascular: Negative for chest pain and palpitations.       Intermittent leg swelling.  Takes lasix prn.   Gastrointestinal: Negative for abdominal pain, blood in stool, nausea and vomiting.  Genitourinary: Negative for difficulty urinating and dysuria.  Musculoskeletal: Negative for joint swelling and myalgias.  Skin: Negative for color change and rash.  Neurological: Negative for dizziness, light-headedness and headaches.  Psychiatric/Behavioral: Negative for agitation and suicidal ideas.       Increased stress.  Some depression - related to the increased stress.         Objective:    Physical Exam  Constitutional: She appears well-developed and well-nourished. No distress.  HENT:  Nose: Nose normal.  Mouth/Throat: Oropharynx is clear and moist.  Neck: Neck supple. No thyromegaly present.  Cardiovascular: Normal rate and regular rhythm.  2-9/5 systolic murmur.   Pulmonary/Chest: Breath sounds normal. No respiratory distress. She has no wheezes.  Abdominal: Soft. Bowel sounds  are normal. There is no tenderness.  Musculoskeletal: She exhibits no tenderness.  No increased edema.    Lymphadenopathy:    She has no cervical adenopathy.  Skin: No rash noted. No erythema.  Psychiatric: She has a normal mood and affect. Her behavior is normal.    BP 90/60 (BP Location: Right Arm, Patient Position: Sitting, Cuff Size: Normal)   Pulse 65   Temp (!) 97.4 F (36.3 C) (Oral)   Resp 15   Wt 146 lb 12 oz (66.6 kg)   LMP 10/08/2011  SpO2 99%   BMI 22.98 kg/m  Wt Readings from Last 3 Encounters:  04/05/18 146 lb 12 oz (66.6 kg)  12/22/17 142 lb 4 oz (64.5 kg)  08/08/17 147 lb 9.6 oz (67 kg)     Lab Results  Component Value Date   WBC 4.0 04/03/2018   HGB 12.8 04/03/2018   HCT 36.3 04/03/2018   PLT 157 04/03/2018   GLUCOSE 73 04/03/2018   CHOL 155 04/03/2018   TRIG 43 04/03/2018   HDL 60 04/03/2018   LDLCALC 86 04/03/2018   ALT 19 04/03/2018   AST 27 04/03/2018   NA 143 04/03/2018   K 4.3 04/03/2018   CL 104 04/03/2018   CREATININE 0.88 04/03/2018   BUN 13 04/03/2018   CO2 25 04/03/2018   TSH 34.110 (H) 04/03/2018   INR 1.0 09/26/2014   HGBA1C 4.9 12/12/2014    Mm 3d Screen Breast Bilateral  Result Date: 08/31/2017 CLINICAL DATA:  Screening. EXAM: DIGITAL SCREENING BILATERAL MAMMOGRAM WITH TOMO AND CAD COMPARISON:  Previous exam(s). ACR Breast Density Category c: The breast tissue is heterogeneously dense, which may obscure small masses. FINDINGS: There are no findings suspicious for malignancy. Images were processed with CAD. IMPRESSION: No mammographic evidence of malignancy. A result letter of this screening mammogram will be mailed directly to the patient. RECOMMENDATION: Screening mammogram in one year. (Code:SM-B-01Y) BI-RADS CATEGORY  1: Negative. Electronically Signed   By: Nolon Nations M.D.   On: 08/31/2017 10:21       Assessment & Plan:   Problem List Items Addressed This Visit    GERD (gastroesophageal reflux disease)     Controlled on current regimen.        Hypertension    Pressure actually runs low.  Takes lasix prn swellng.  monitor for low pressures.   Follow.       Hypertrophic obstructive cardiomyopathy (HCC)    S/p septal myectomy.  Followed by Dr Mina Marble.  Just evaluated.  No changes made.  S/p defibrillator placement.        Hypothyroidism    TSH elevated.  She has not been taking her medications regularly.  Take daily.  Recheck tsh in 6 weeks.        Relevant Orders   TSH   IBS    Has had extensive w/up.  Bowels stable.        ICD (implantable cardioverter-defibrillator) in place    Followed by Dr Mina Marble.        Iron deficiency anemia refractory to iron therapy    Has previously been followed by Dr Grayland Ormond.  Recent hgb wnl.        NSVT (nonsustained ventricular tachycardia) (HCC)    S/p defibrillator placement.  Stable.  Just evaluated.  No changes made.        Thrombocytopenia (HCC)    Recent platelet count wnl.       Vitamin D deficiency    Follow vitamin D level.            Einar Pheasant, MD

## 2018-04-08 ENCOUNTER — Encounter: Payer: Self-pay | Admitting: Internal Medicine

## 2018-04-08 NOTE — Assessment & Plan Note (Signed)
Follow vitamin D level.  

## 2018-04-08 NOTE — Assessment & Plan Note (Signed)
Has had extensive w/up.  Bowels stable.

## 2018-04-08 NOTE — Assessment & Plan Note (Signed)
Has previously been followed by Dr Grayland Ormond.  Recent hgb wnl.

## 2018-04-08 NOTE — Assessment & Plan Note (Signed)
S/p defibrillator placement.  Stable.  Just evaluated.  No changes made.

## 2018-04-08 NOTE — Assessment & Plan Note (Signed)
Controlled on current regimen.   

## 2018-04-08 NOTE — Assessment & Plan Note (Signed)
TSH elevated.  She has not been taking her medications regularly.  Take daily.  Recheck tsh in 6 weeks.

## 2018-04-08 NOTE — Assessment & Plan Note (Signed)
S/p septal myectomy.  Followed by Dr Mina Marble.  Just evaluated.  No changes made.  S/p defibrillator placement.

## 2018-04-08 NOTE — Assessment & Plan Note (Addendum)
Pressure actually runs low.  Takes lasix prn swellng.  monitor for low pressures.   Follow.

## 2018-04-08 NOTE — Assessment & Plan Note (Signed)
Recent platelet count wnl.  

## 2018-04-08 NOTE — Assessment & Plan Note (Signed)
Followed by Dr Wang.  

## 2018-06-22 ENCOUNTER — Other Ambulatory Visit: Payer: Self-pay | Admitting: Internal Medicine

## 2018-06-28 ENCOUNTER — Telehealth: Payer: Self-pay | Admitting: Gastroenterology

## 2018-06-28 NOTE — Telephone Encounter (Signed)
Patient called & has a prescription(Lotronix) that needs a prior auth with [facility] [id number]). She has another prescrition for Lomitil that she gets from [facility] & only gets a 30 Day supply,please send in.

## 2018-06-29 ENCOUNTER — Other Ambulatory Visit: Payer: Self-pay

## 2018-06-29 DIAGNOSIS — R197 Diarrhea, unspecified: Secondary | ICD-10-CM

## 2018-06-29 MED ORDER — DIPHENOXYLATE-ATROPINE 2.5-0.025 MG PO TABS: 1.0000 | ORAL_TABLET | Freq: Four times a day (QID) | ORAL | 0 refills | Status: DC | PRN

## 2018-06-29 NOTE — Telephone Encounter (Signed)
Called in pt's Lomotil to [facility]. Left pt a voicemail that we do need a follow up appt for additional refills. I have not received a request for her Lotronix to have a prior authorization.

## 2018-07-06 ENCOUNTER — Encounter: Payer: Self-pay | Admitting: Internal Medicine

## 2018-07-06 LAB — TSH: TSH: 2.92 u[IU]/mL (ref 0.450–4.500)

## 2018-07-11 ENCOUNTER — Ambulatory Visit: Payer: Managed Care, Other (non HMO) | Admitting: Internal Medicine

## 2018-07-11 ENCOUNTER — Encounter: Payer: Self-pay | Admitting: Internal Medicine

## 2018-07-11 ENCOUNTER — Telehealth: Payer: Self-pay

## 2018-07-11 VITALS — BP 92/62 | HR 59 | Temp 97.7°F | Resp 18 | Wt 142.8 lb

## 2018-07-11 DIAGNOSIS — D508 Other iron deficiency anemias: Secondary | ICD-10-CM

## 2018-07-11 DIAGNOSIS — E559 Vitamin D deficiency, unspecified: Secondary | ICD-10-CM

## 2018-07-11 DIAGNOSIS — K219 Gastro-esophageal reflux disease without esophagitis: Secondary | ICD-10-CM | POA: Diagnosis not present

## 2018-07-11 DIAGNOSIS — K580 Irritable bowel syndrome with diarrhea: Secondary | ICD-10-CM

## 2018-07-11 DIAGNOSIS — F341 Dysthymic disorder: Secondary | ICD-10-CM | POA: Diagnosis not present

## 2018-07-11 DIAGNOSIS — I4729 Other ventricular tachycardia: Secondary | ICD-10-CM

## 2018-07-11 DIAGNOSIS — I10 Essential (primary) hypertension: Secondary | ICD-10-CM | POA: Diagnosis not present

## 2018-07-11 DIAGNOSIS — Z1239 Encounter for other screening for malignant neoplasm of breast: Secondary | ICD-10-CM | POA: Diagnosis not present

## 2018-07-11 DIAGNOSIS — I472 Ventricular tachycardia: Secondary | ICD-10-CM

## 2018-07-11 DIAGNOSIS — D696 Thrombocytopenia, unspecified: Secondary | ICD-10-CM

## 2018-07-11 DIAGNOSIS — I421 Obstructive hypertrophic cardiomyopathy: Secondary | ICD-10-CM

## 2018-07-11 DIAGNOSIS — G43809 Other migraine, not intractable, without status migrainosus: Secondary | ICD-10-CM

## 2018-07-11 DIAGNOSIS — E039 Hypothyroidism, unspecified: Secondary | ICD-10-CM

## 2018-07-11 MED ORDER — LEVOTHYROXINE SODIUM 88 MCG PO TABS: 88.0000 ug | ORAL_TABLET | Freq: Every day | ORAL | 1 refills | Status: DC

## 2018-07-11 MED ORDER — ONDANSETRON HCL 4 MG PO TABS: 4.0000 mg | ORAL_TABLET | Freq: Two times a day (BID) | ORAL | 0 refills | Status: DC | PRN

## 2018-07-11 NOTE — Progress Notes (Signed)
Patient ID: Carol Mahoney, female   DOB: [birth year], 43 y.o.   MRN: [id number]   Subjective:    Patient ID: Carol Mahoney, female    DOB: [birth year], 43 y.o.   MRN: [id number]  HPI  Patient here for a scheduled follow up.  States she is doing "about the same".  Still with increased stress.  Discussed with her today.  Increased stress with her job and family issues.  Sees Dr Toy Care.  On medication.  Not seeing her therapist now.  Dr Toy Care has given her name of therapist if unable to f/u with her therapist.  Daughter is now seeing Dr Toy Care and they are adjusting her medication.  Does not feel needs any further intervention at this time.  No chest pain.  Breathing stable.  No acid reflux.  Bowels may be a little better, but she reports she is not eating as much.  Sees cardiology.  Stable.  Blood pressure stable.     Past Medical History:  Diagnosis Date  . Anemia   . Anxiety   . Depression   . GERD (gastroesophageal reflux disease)   . H/O hiatal hernia   . Hypertension   . Hypertr obst cardiomyop    mitral regurgitation, LAE - followed at [facility]  . Hypothyroidism   . Migraine headache   . PONV (postoperative nausea and vomiting)    Past Surgical History:  Procedure Laterality Date  . BUNIONECTOMY    . CHOLECYSTECTOMY    . HIATAL HERNIA REPAIR    . IMPLANTABLE CARDIOVERTER DEFIBRILLATOR REVISION  05/21/14   [facility]  . PARTIAL GASTRECTOMY    . TONSILLECTOMY     Family History  Problem Relation Age of Onset  . Anesthesia problems Father   . Hypertension Mother   . Hashimoto's thyroiditis Maternal Grandmother   . Hypertension Maternal Grandmother   . Breast cancer Neg Hx   . Colon cancer Neg Hx    Social History   Socioeconomic History  . Marital status: Single    Spouse name: Not on file  . Number of children: 1  . Years of education: Not on file  . Highest education level: Not on file  Occupational History  . Occupation: PROGRAM Printmaker: [employer]  .  Financial resource strain: Not on file  . Food insecurity:    Worry: Not on file    Inability: Not on file  . Transportation needs:    Medical: Not on file    Non-medical: Not on file  Tobacco Use  . Smoking status: Never Smoker  . Smokeless tobacco: Never Used  Substance and Sexual Activity  . Alcohol use: No    Alcohol/week: 0.0 standard drinks  . Drug use: No  . Sexual activity: Not Currently  Lifestyle  . Physical activity:    Days per week: Not on file    Minutes per session: Not on file  . Stress: Not on file  Relationships  . Social connections:    Talks on phone: Not on file    Gets together: Not on file    Attends religious service: Not on file    Active member of club or organization: Not on file    Attends meetings of clubs or organizations: Not on file    Relationship status: Not on file  Other Topics Concern  . Not on file  Social History Narrative  . Not on file    Outpatient Encounter Medications as of  07/11/2018  Medication Sig  . acetaminophen (TYLENOL) 500 MG tablet Take 500 mg by mouth every 6 (six) hours as needed. For headache  . alosetron (LOTRONEX) 1 MG tablet Take 1 tablet (1 mg total) by mouth 2 (two) times daily.  Marland Kitchen ALPRAZolam (XANAX) 0.5 MG tablet 0.5 mg 3 (three) times daily as needed.   . cyclobenzaprine (FLEXERIL) 5 MG tablet Take 1 tablet (5 mg total) by mouth at bedtime as needed for muscle spasms.  . diphenoxylate-atropine (LOMOTIL) 2.5-0.025 MG tablet Take 1 tablet by mouth 4 (four) times daily as needed for up to 30 days for diarrhea or loose stools. **PT NEEDS FOLLOW UP APPT**  . EPINEPHrine (EPIPEN 2-PAK) 0.3 mg/0.3 mL IJ SOAJ injection USE AS DIRECTED ON PACKAGE  . furosemide (LASIX) 20 MG tablet Take 20 mg by mouth daily as needed.   Marland Kitchen levothyroxine (SYNTHROID, LEVOTHROID) 88 MCG tablet Take 1 tablet (88 mcg total) by mouth daily.  . metoprolol tartrate (LOPRESSOR) 25 MG tablet Take 25 mg by mouth 2 (two) times daily. 50mg  in the  morning 25 mg at night  . ondansetron (ZOFRAN) 4 MG tablet Take 1 tablet (4 mg total) by mouth 2 (two) times daily as needed for nausea or vomiting.  . pantoprazole (PROTONIX) 40 MG tablet TAKE 1 TABLET BY MOUTH  DAILY  . SUMAtriptan (IMITREX) 50 MG tablet Take 1 tablet by mouth  every 2 hours as needed for migraine(s)  . traZODone (DESYREL) 50 MG tablet 50 mg. Takes 1/2 tablet as needed at bedtime  . vitamin B-12 (CYANOCOBALAMIN) 1000 MCG tablet Take 1 tablet (1,000 mcg total) by mouth daily.  Marland Kitchen vortioxetine HBr (TRINTELLIX) 10 MG TABS Take by mouth.  . [DISCONTINUED] cyanocobalamin ([facility] VITAMIN B12) 1000 MCG tablet Take by mouth.  . [DISCONTINUED] levothyroxine (SYNTHROID, LEVOTHROID) 88 MCG tablet Take 1 tablet (88 mcg total) by mouth daily.  . [DISCONTINUED] ondansetron (ZOFRAN) 4 MG tablet Take 1 tablet (4 mg total) by mouth 2 (two) times daily as needed for nausea or vomiting.   No facility-administered encounter medications on file as of 07/11/2018.     Review of Systems  Constitutional:       Some decreased po intake.  Eating.  Weight overall stable.   HENT: Negative for congestion and sinus pressure.   Respiratory: Negative for cough and chest tightness.        Breathing stable.   Cardiovascular: Negative for chest pain and palpitations.       Leg swelling - better.    Gastrointestinal: Negative for nausea and vomiting.       Bowels stable.    Genitourinary: Negative for difficulty urinating and dysuria.  Musculoskeletal: Negative for joint swelling and myalgias.  Skin: Negative for color change and rash.  Neurological: Negative for dizziness, light-headedness and headaches.  Psychiatric/Behavioral: Negative for agitation and dysphoric mood.       Increased stress as outlined.         Objective:     Blood pressure rechecked by me:  102/68  Physical Exam Constitutional:      General: She is not in acute distress.    Appearance: Normal appearance.  HENT:     Nose: Nose  normal. No congestion.     Mouth/Throat:     Pharynx: No oropharyngeal exudate or posterior oropharyngeal erythema.  Neck:     Musculoskeletal: Neck supple. No muscular tenderness.     Thyroid: No thyromegaly.  Cardiovascular:     Rate and Rhythm: Normal  rate and regular rhythm.     Comments: 2/6 systolic murmur.  Pulmonary:     Effort: No respiratory distress.     Breath sounds: Normal breath sounds. No wheezing.  Abdominal:     General: Bowel sounds are normal.     Palpations: Abdomen is soft.     Tenderness: There is no abdominal tenderness.  Musculoskeletal:        General: No swelling or tenderness.  Lymphadenopathy:     Cervical: No cervical adenopathy.  Skin:    Findings: No erythema or rash.  Neurological:     Mental Status: She is alert.  Psychiatric:        Mood and Affect: Mood normal.        Behavior: Behavior normal.     BP 92/62   Pulse (!) 59   Temp 97.7 F (36.5 C) (Oral)   Resp 18   Wt 142 lb 12.8 oz (64.8 kg)   LMP 10/08/2011   SpO2 99%   BMI 22.37 kg/m  Wt Readings from Last 3 Encounters:  07/11/18 142 lb 12.8 oz (64.8 kg)  04/05/18 146 lb 12 oz (66.6 kg)  12/22/17 142 lb 4 oz (64.5 kg)     Lab Results  Component Value Date   WBC 4.0 04/03/2018   HGB 12.8 04/03/2018   HCT 36.3 04/03/2018   PLT 157 04/03/2018   GLUCOSE 73 04/03/2018   CHOL 155 04/03/2018   TRIG 43 04/03/2018   HDL 60 04/03/2018   LDLCALC 86 04/03/2018   ALT 19 04/03/2018   AST 27 04/03/2018   NA 143 04/03/2018   K 4.3 04/03/2018   CL 104 04/03/2018   CREATININE 0.88 04/03/2018   BUN 13 04/03/2018   CO2 25 04/03/2018   TSH 2.920 07/05/2018   INR 1.0 09/26/2014   HGBA1C 4.9 12/12/2014    Mm 3d Screen Breast Bilateral  Result Date: 08/31/2017 CLINICAL DATA:  Screening. EXAM: DIGITAL SCREENING BILATERAL MAMMOGRAM WITH TOMO AND CAD COMPARISON:  Previous exam(s). ACR Breast Density Category c: The breast tissue is heterogeneously dense, which may obscure small  masses. FINDINGS: There are no findings suspicious for malignancy. Images were processed with CAD. IMPRESSION: No mammographic evidence of malignancy. A result letter of this screening mammogram will be mailed directly to the patient. RECOMMENDATION: Screening mammogram in one year. (Code:SM-B-01Y) BI-RADS CATEGORY  1: Negative. Electronically Signed   By: Nolon Nations M.D.   On: 08/31/2017 10:21       Assessment & Plan:   Problem List Items Addressed This Visit    ANXIETY DEPRESSION    Followed by psychiatry - Dr Toy Care.  Discussed seeing her counselor.  Overall she does not feel needs any further intervention at this time.  Will continue to f/u with psychiatry.        GERD (gastroesophageal reflux disease)    No upper symptoms reported.        Relevant Medications   ondansetron (ZOFRAN) 4 MG tablet   Hypertension    Pressure runs low now.  Needs the diuretic prn.  Has not needed as often.  Follow pressures.  Follow metabolic panel.        Hypertrophic obstructive cardiomyopathy (HCC)    S/p septal myectomy.  Followed by Dr Mina Marble.  Felt stable.  S/p defibrillator placement.  Follow.        Hypothyroidism    Recent tsh wnl.  Taking her thyroid medication regularly now.  Follow.  Relevant Medications   levothyroxine (SYNTHROID, LEVOTHROID) 88 MCG tablet   IBS    Bowels stable.        Relevant Medications   ondansetron (ZOFRAN) 4 MG tablet   Iron deficiency anemia refractory to iron therapy    Previously saw Dr Grayland Ormond.  Follow cbc and ferritin.       Migraine    Has seen neurology.  Has migraine headaches.  Follow.        NSVT (nonsustained ventricular tachycardia) (HCC)    S/p defibrillator placement.  Followed by cardiology.        Thrombocytopenia ([place])    Follow cbc.       Vitamin D deficiency    Follow vitamin D level.        Other Visit Diagnoses    Breast cancer screening    -  Primary   Relevant Orders   MM 3D SCREEN BREAST BILATERAL        Einar Pheasant, MD

## 2018-07-11 NOTE — Telephone Encounter (Signed)
Scheduled mammogram at [place] for 09/06/2018 at 8:00 am. Left appt date and time on patients voicemail with the number to [facility] for her to reschedule if needed

## 2018-07-14 ENCOUNTER — Encounter: Payer: Self-pay | Admitting: Internal Medicine

## 2018-07-14 NOTE — Assessment & Plan Note (Signed)
Follow cbc.  

## 2018-07-14 NOTE — Assessment & Plan Note (Signed)
Has seen neurology.  Has migraine headaches.  Follow.

## 2018-07-14 NOTE — Assessment & Plan Note (Signed)
S/p defibrillator placement.  Followed by cardiology.

## 2018-07-14 NOTE — Assessment & Plan Note (Signed)
S/p septal myectomy.  Followed by Dr Wang.  Felt stable.  S/p defibrillator placement.  Follow.  

## 2018-07-14 NOTE — Assessment & Plan Note (Signed)
Pressure runs low now.  Needs the diuretic prn.  Has not needed as often.  Follow pressures.  Follow metabolic panel.

## 2018-07-14 NOTE — Assessment & Plan Note (Signed)
Bowels stable.  

## 2018-07-14 NOTE — Assessment & Plan Note (Signed)
Follow vitamin D level.  

## 2018-07-14 NOTE — Assessment & Plan Note (Signed)
No upper symptoms reported.   

## 2018-07-14 NOTE — Assessment & Plan Note (Signed)
Recent tsh wnl.  Taking her thyroid medication regularly now.  Follow.

## 2018-07-14 NOTE — Assessment & Plan Note (Signed)
Followed by psychiatry - Dr Toy Care.  Discussed seeing her counselor.  Overall she does not feel needs any further intervention at this time.  Will continue to f/u with psychiatry.

## 2018-07-14 NOTE — Assessment & Plan Note (Signed)
Previously saw Dr Finnegan.  Follow cbc and ferritin.  

## 2018-08-03 ENCOUNTER — Telehealth: Payer: Self-pay | Admitting: Gastroenterology

## 2018-08-03 NOTE — Telephone Encounter (Signed)
Did she need anything or did she want me to know she has an appt?

## 2018-08-03 NOTE — Telephone Encounter (Signed)
Pt is scheduled for rx refill apt for 09/19/18 see prior message.

## 2018-08-04 ENCOUNTER — Encounter: Payer: Self-pay | Admitting: Internal Medicine

## 2018-08-28 ENCOUNTER — Encounter: Payer: Self-pay | Admitting: Gastroenterology

## 2018-08-28 ENCOUNTER — Other Ambulatory Visit: Payer: Self-pay | Admitting: Gastroenterology

## 2018-08-28 ENCOUNTER — Other Ambulatory Visit: Payer: Self-pay

## 2018-08-28 DIAGNOSIS — R197 Diarrhea, unspecified: Secondary | ICD-10-CM

## 2018-08-28 MED ORDER — ALOSETRON HCL 1 MG PO TABS: 1.0000 mg | ORAL_TABLET | Freq: Two times a day (BID) | ORAL | 0 refills | Status: DC

## 2018-08-28 NOTE — Telephone Encounter (Signed)
Refill for Lotronix has been sent to [facility]. Pt will need an office appt before any more refills are sent.

## 2018-08-28 NOTE — Telephone Encounter (Signed)
*  STAT* If patient is at the pharmacy, call can be transferred to refill team.   1. Which medications need to be refilled? (please list name of each medication and dose if known)  alosetron (LOTRONEX) 1 MG tablet On chart     1 dispense in past 6 months   Dose: 1 mg Take 1 tablet (1 mg total) by mouth 2 (two) times daily. 09/15/2017     2. Which pharmacy/location (including street and city if local pharmacy) is medication to be sent to? [facility]   3. Do they need a 30 day or 90 day supply? 90 day

## 2018-08-28 NOTE — Telephone Encounter (Signed)
error 

## 2018-09-04 ENCOUNTER — Other Ambulatory Visit: Payer: Self-pay

## 2018-09-05 ENCOUNTER — Other Ambulatory Visit: Payer: Self-pay

## 2018-09-05 ENCOUNTER — Encounter: Payer: Self-pay | Admitting: Gastroenterology

## 2018-09-05 ENCOUNTER — Ambulatory Visit (INDEPENDENT_AMBULATORY_CARE_PROVIDER_SITE_OTHER): Payer: Managed Care, Other (non HMO) | Admitting: Gastroenterology

## 2018-09-05 DIAGNOSIS — K580 Irritable bowel syndrome with diarrhea: Secondary | ICD-10-CM

## 2018-09-05 NOTE — Progress Notes (Signed)
Lucilla Lame, MD [address]  [place]  [place], [place] [address]  Main: [phone]  Fax: [phone]    Gastroenterology Virtual/Video Visit  Referring Provider:     Einar Pheasant, MD Primary Care Physician:  Einar Pheasant, MD Primary Gastroenterologist:  Dr.Roshan Salamon Allen Norris Reason for Consultation:     Medication refill        HPI:    Virtual Visit via Video Note Location of the patient: Home Location of provider: Office  Participating persons: The patient myself and Ginger Feldpausch.  I connected with Carol Mahoney on 09/05/18 at  8:15 AM EDT by a video enabled telemedicine application and verified that I am speaking with the correct person using two identifiers.   I discussed the limitations of evaluation and management by telemedicine and the availability of in person appointments. The patient expressed understanding and agreed to proceed.  Verbal consent to proceed obtained.  History of Present Illness: Carol Mahoney is a 43 y.o. female referred by Dr. Einar Pheasant, MD  for consultation & management of diarrhea and her treatment with Lotronex.  This patient has had a history of diarrhea and has been treated with Lotronex for the last few years.  The patient has not been seen since 2017 and now contact us for a refill of her medication.  The patient was informed that she would need to have an office visit prior to any refills since it had been so long since we had seen the patient.  Past Medical History:  Diagnosis Date  . Anemia   . Anxiety   . Depression   . GERD (gastroesophageal reflux disease)   . H/O hiatal hernia   . Hypertension   . Hypertr obst cardiomyop    mitral regurgitation, LAE - followed at [facility]  . Hypothyroidism   . Migraine headache   . PONV (postoperative nausea and vomiting)     Past Surgical History:  Procedure Laterality Date  . BUNIONECTOMY    . CHOLECYSTECTOMY    . HIATAL HERNIA REPAIR    . IMPLANTABLE CARDIOVERTER  DEFIBRILLATOR REVISION  05/21/14   [facility]  . PARTIAL GASTRECTOMY    . TONSILLECTOMY      Prior to Admission medications   Medication Sig Start Date End Date Taking? Authorizing Provider  acetaminophen (TYLENOL) 500 MG tablet Take 500 mg by mouth every 6 (six) hours as needed. For headache    [provider]  alosetron (LOTRONEX) 1 MG tablet Take 1 tablet (1 mg total) by mouth 2 (two) times daily. 08/28/18   Lucilla Lame, MD  ALPRAZolam Duanne Moron) 0.5 MG tablet 0.5 mg 3 (three) times daily as needed.  05/02/12   [provider]  cyclobenzaprine (FLEXERIL) 5 MG tablet Take 1 tablet (5 mg total) by mouth at bedtime as needed for muscle spasms. 08/08/17   Einar Pheasant, MD  diphenoxylate-atropine (LOMOTIL) 2.5-0.025 MG tablet Take 1 tablet by mouth 4 (four) times daily as needed for up to 30 days for diarrhea or loose stools. **PT NEEDS FOLLOW UP APPT** 06/29/18 07/29/18  Lucilla Lame, MD  EPINEPHrine (EPIPEN 2-PAK) 0.3 mg/0.3 mL IJ SOAJ injection USE AS DIRECTED ON PACKAGE 12/22/17   Einar Pheasant, MD  furosemide (LASIX) 20 MG tablet Take 20 mg by mouth daily as needed.     [provider]  levothyroxine (SYNTHROID, LEVOTHROID) 88 MCG tablet Take 1 tablet (88 mcg total) by mouth daily. 07/11/18   Einar Pheasant, MD  metoprolol tartrate (LOPRESSOR) 25 MG  tablet Take 25 mg by mouth 2 (two) times daily. 50mg  in the morning 25 mg at night    [provider]  ondansetron (ZOFRAN) 4 MG tablet Take 1 tablet (4 mg total) by mouth 2 (two) times daily as needed for nausea or vomiting. 07/11/18   Einar Pheasant, MD  pantoprazole (PROTONIX) 40 MG tablet TAKE 1 TABLET BY MOUTH  DAILY 06/23/18   Einar Pheasant, MD  SPRAVATO, 84 MG DOSE, 28 MG/DEVICE SOPK  07/27/18   [provider]  SUMAtriptan (IMITREX) 50 MG tablet Take 1 tablet by mouth  every 2 hours as needed for migraine(s) 03/17/15   Einar Pheasant, MD  traZODone (DESYREL) 50 MG tablet 50 mg. Takes 1/2 tablet as needed at  bedtime    [provider]  vitamin B-12 (CYANOCOBALAMIN) 1000 MCG tablet Take 1 tablet (1,000 mcg total) by mouth daily. 02/04/16   Einar Pheasant, MD  vortioxetine HBr (TRINTELLIX) 10 MG TABS Take by mouth.    [provider]    Family History  Problem Relation Age of Onset  . Anesthesia problems Father   . Hypertension Mother   . Hashimoto's thyroiditis Maternal Grandmother   . Hypertension Maternal Grandmother   . Breast cancer Neg Hx   . Colon cancer Neg Hx      Social History   Tobacco Use  . Smoking status: Never Smoker  . Smokeless tobacco: Never Used  Substance Use Topics  . Alcohol use: No    Alcohol/week: 0.0 standard drinks  . Drug use: No    Allergies as of 09/05/2018 - Review Complete 07/14/2018  Allergen Reaction Noted  . Carafate [sucralfate] Rash and Hives 04/19/2011  . Chloraprep one step [chlorhexidine gluconate] Itching and Hives 10/14/2011  . Shellfish-derived products Swelling 09/04/2015  . Vancomycin Itching and Dermatitis 10/20/2011  . Penicillins Hives and Rash 10/14/2011  . Sulfa antibiotics Hives and Rash 10/14/2011  . Iodine Rash 10/20/2011    Review of Systems:    All systems reviewed and negative except where noted in HPI.   Observations/Objective:  Labs: CBC    Component Value Date/Time   WBC 4.0 04/03/2018 1333   WBC 3.8 06/06/2017 1340   RBC 4.33 04/03/2018 1333   RBC 4.26 06/06/2017 1340   HGB 12.8 04/03/2018 1333   HCT 36.3 04/03/2018 1333   PLT 157 04/03/2018 1333   MCV 84 04/03/2018 1333   MCV 80 02/19/2013 1358   MCH 29.6 04/03/2018 1333   MCH 29.7 06/06/2017 1340   MCHC 35.3 04/03/2018 1333   MCHC 34.3 06/06/2017 1340   RDW 12.9 04/03/2018 1333   RDW 23.1 (H) 02/19/2013 1358   LYMPHSABS 1.9 04/03/2018 1333   LYMPHSABS 1.6 02/19/2013 1358   MONOABS 0.2 06/06/2017 1340   MONOABS 0.4 02/19/2013 1358   EOSABS 0.1 04/03/2018 1333   EOSABS 0.2 02/19/2013 1358   BASOSABS 0.1 04/03/2018 1333    BASOSABS 0.0 02/19/2013 1358   CMP     Component Value Date/Time   NA 143 04/03/2018 1333   K 4.3 04/03/2018 1333   CL 104 04/03/2018 1333   CO2 25 04/03/2018 1333   GLUCOSE 73 04/03/2018 1333   GLUCOSE 76 10/20/2011 0932   BUN 13 04/03/2018 1333   CREATININE 0.88 04/03/2018 1333   CALCIUM 9.0 04/03/2018 1333   PROT 6.1 04/03/2018 1333   ALBUMIN 3.9 04/03/2018 1333   AST 27 04/03/2018 1333   ALT 19 04/03/2018 1333   ALKPHOS 76 04/03/2018 1333   BILITOT  0.7 04/03/2018 1333   GFRNONAA 80 04/03/2018 1333   GFRAA 92 04/03/2018 1333    Imaging Studies: No results found.  Assessment and Plan:   Carol Mahoney is a 43 y.o. y/o female has been referred for refill of her Lotronex.  The patient states that she has been out of it for a month and has had a lot of diarrhea with excoriations around her anus.  The patient denies any rectal bleeding unexplained weight loss fevers chills nausea or vomiting.  There is also no report of any black stools.  When the patient takes the medication she denies any constipation and takes it with Lomotil to keep her diarrhea under control.  The patient will be given a refill of her medication.  She has been told to contact me with any new symptoms that may arise.  Follow Up Instructions:  I discussed the assessment and treatment plan with the patient. The patient was provided an opportunity to ask questions and all were answered. The patient agreed with the plan and demonstrated an understanding of the instructions.   The patient was advised to call back or seek an in-person evaluation if the symptoms worsen or if the condition fails to improve as anticipated.  I provided 12 minutes of non-face-to-face time during this encounter.   Lucilla Lame, MD  Speech recognition software was used to dictate the above note.

## 2018-09-11 ENCOUNTER — Telehealth: Payer: Self-pay | Admitting: Gastroenterology

## 2018-09-11 ENCOUNTER — Telehealth: Payer: Self-pay

## 2018-09-11 NOTE — Telephone Encounter (Signed)
Request Reference Number: [id number].  ALOSETRON TAB 1MG  is approved through 03/13/2019.

## 2018-09-11 NOTE — Telephone Encounter (Signed)
Patient called in about medication alosetron (LOTRONEX) 1 MG tablet that has been sent in to [facility] for a refill. However it needs a prior auth. Please call [phone].

## 2018-09-11 NOTE — Telephone Encounter (Signed)
Alosetron 1mg  prior authorization has been started with [email or web address]

## 2018-09-13 ENCOUNTER — Telehealth: Payer: Self-pay | Admitting: Gastroenterology

## 2018-09-13 NOTE — Telephone Encounter (Signed)
Received a fax from [facility]  Regarding notice of approval rx Alosetron TB 1mg  has been approved

## 2018-09-19 ENCOUNTER — Other Ambulatory Visit: Payer: Self-pay | Admitting: Internal Medicine

## 2018-09-19 ENCOUNTER — Ambulatory Visit: Payer: Managed Care, Other (non HMO) | Admitting: Gastroenterology

## 2018-10-08 ENCOUNTER — Encounter: Payer: Self-pay | Admitting: Internal Medicine

## 2018-10-10 ENCOUNTER — Telehealth: Payer: Self-pay | Admitting: Internal Medicine

## 2018-10-10 NOTE — Telephone Encounter (Signed)
Copied from [place] [phone]. Topic: Appointment Scheduling - Prior Auth Required for Appointment >> Oct 10, 2018 10:04 AM Robina Ade, Helene Kelp D wrote: No appointment has been scheduled. Patient is requesting virtual appointment. Per scheduling protocol, this appointment requires a prior authorization prior to scheduling.  Route to department's PEC pool. Patient call back returning office call for an appt. I called office but couldn't get through. Patient can be reached by 10:30 or after 12pm due to she has mandatory meeting at work.

## 2018-10-10 NOTE — Telephone Encounter (Signed)
Pt has appt tomorrow

## 2018-10-11 ENCOUNTER — Ambulatory Visit (INDEPENDENT_AMBULATORY_CARE_PROVIDER_SITE_OTHER): Payer: Managed Care, Other (non HMO) | Admitting: Internal Medicine

## 2018-10-11 ENCOUNTER — Encounter: Payer: Self-pay | Admitting: Internal Medicine

## 2018-10-11 ENCOUNTER — Other Ambulatory Visit: Payer: Self-pay

## 2018-10-11 DIAGNOSIS — F341 Dysthymic disorder: Secondary | ICD-10-CM | POA: Diagnosis not present

## 2018-10-11 DIAGNOSIS — R197 Diarrhea, unspecified: Secondary | ICD-10-CM | POA: Diagnosis not present

## 2018-10-11 DIAGNOSIS — R5383 Other fatigue: Secondary | ICD-10-CM

## 2018-10-11 DIAGNOSIS — K219 Gastro-esophageal reflux disease without esophagitis: Secondary | ICD-10-CM | POA: Diagnosis not present

## 2018-10-11 DIAGNOSIS — I421 Obstructive hypertrophic cardiomyopathy: Secondary | ICD-10-CM

## 2018-10-11 DIAGNOSIS — E039 Hypothyroidism, unspecified: Secondary | ICD-10-CM

## 2018-10-11 DIAGNOSIS — R509 Fever, unspecified: Secondary | ICD-10-CM

## 2018-10-11 DIAGNOSIS — D508 Other iron deficiency anemias: Secondary | ICD-10-CM

## 2018-10-11 NOTE — Progress Notes (Signed)
Patient ID: Carol Mahoney, female   DOB: 1975-10-24, 43 y.o.   MRN: [id number]   Virtual Visit via video Note  This visit type was conducted due to national recommendations for restrictions regarding the COVID-19 pandemic (e.g. social distancing).  This format is felt to be most appropriate for this patient at this time.  All issues noted in this document were discussed and addressed.  No physical exam was performed (except for noted visual exam findings with Video Visits).   I connected with Carol Mahoney by a video enabled telemedicine application and verified that I am speaking with the correct person using two identifiers. Location patient: home Location provider: work Persons participating in the virtual visit: patient, provider  I discussed the limitations, risks, security and privacy concerns of performing an evaluation and management service by video and the availability of in person appointments. . The patient expressed understanding and agreed to proceed.   Reason for visit: acute visit.   HPI: States that starting 09/09/18, she started having some headaches. Started taking sinus medication.  Headache improved.  Beginning of May, she noticed itchy throat, some post nasal drainage and dry cough.  Started taking claritin.  Symptoms improved.  On 5/8 noticed fatigue and just not feeling well.  Started having low grade temp - 99-100.  Symptoms have persisted.  Tmax 100.5.  Some increased cough when lying down.  Face tender.sore throat.  Some nausea with decreased appetite.  Occasional cough after eating.  Previous change in taste.  Things just did not taste as good.  This has resolved.  She is working from home.  Has tried to stay in due to COVID restrictions.  Her neighbor died after contracting COVID.  Feels worse at night.  Blood pressure 95/58. Temp today 99.6.  Increased stress with her family issues.  Her daughter has some mental health issues.  Both seeing psychiatry.  She is taking  protonix.  No chest pain.  Breathing overall stable.  Some occasional notice of increased heart rate.  No abdominal pain.  Bowels unchanged.     ROS: See pertinent positives and negatives per HPI.  Past Medical History:  Diagnosis Date  . Anemia   . Anxiety   . Depression   . GERD (gastroesophageal reflux disease)   . H/O hiatal hernia   . Hypertension   . Hypertr obst cardiomyop    mitral regurgitation, LAE - followed at [facility]  . Hypothyroidism   . Migraine headache   . PONV (postoperative nausea and vomiting)     Past Surgical History:  Procedure Laterality Date  . BUNIONECTOMY    . CHOLECYSTECTOMY    . HIATAL HERNIA REPAIR    . IMPLANTABLE CARDIOVERTER DEFIBRILLATOR REVISION  05/21/14   [facility]  . PARTIAL GASTRECTOMY    . TONSILLECTOMY      Family History  Problem Relation Age of Onset  . Anesthesia problems Father   . Hypertension Mother   . Hashimoto's thyroiditis Maternal Grandmother   . Hypertension Maternal Grandmother   . Breast cancer Neg Hx   . Colon cancer Neg Hx     SOCIAL HX: reviewed.    Current Outpatient Medications:  .  acetaminophen (TYLENOL) 500 MG tablet, Take 500 mg by mouth every 6 (six) hours as needed. For headache, Disp: , Rfl:  .  alosetron (LOTRONEX) 1 MG tablet, Take 1 tablet (1 mg total) by mouth 2 (two) times daily., Disp: 180 tablet, Rfl: 0 .  ALPRAZolam (XANAX) 0.5 MG tablet, 0.5  mg 3 (three) times daily as needed. , Disp: , Rfl:  .  EPINEPHrine (EPIPEN 2-PAK) 0.3 mg/0.3 mL IJ SOAJ injection, USE AS DIRECTED ON PACKAGE, Disp: 2 Device, Rfl: 1 .  furosemide (LASIX) 20 MG tablet, Take 20 mg by mouth daily as needed. , Disp: , Rfl:  .  levothyroxine (SYNTHROID) 88 MCG tablet, TAKE 1 TABLET(88 MCG) BY MOUTH DAILY, Disp: 90 tablet, Rfl: 0 .  metoprolol tartrate (LOPRESSOR) 25 MG tablet, Take 25 mg by mouth 2 (two) times daily. 50mg  in the morning 25 mg at night, Disp: , Rfl:  .  ondansetron (ZOFRAN) 4 MG tablet, Take 1 tablet (4 mg total) by  mouth 2 (two) times daily as needed for nausea or vomiting., Disp: 20 tablet, Rfl: 0 .  pantoprazole (PROTONIX) 40 MG tablet, TAKE 1 TABLET BY MOUTH  DAILY, Disp: 90 tablet, Rfl: 3 .  SUMAtriptan (IMITREX) 50 MG tablet, Take 1 tablet by mouth  every 2 hours as needed for migraine(s), Disp: 54 tablet, Rfl: 2 .  traZODone (DESYREL) 50 MG tablet, 50 mg. Takes 1/2 tablet as needed at bedtime, Disp: , Rfl:  .  vitamin B-12 (CYANOCOBALAMIN) 1000 MCG tablet, Take 1 tablet (1,000 mcg total) by mouth daily., Disp: 30 tablet, Rfl: 3 .  vortioxetine HBr (TRINTELLIX) 10 MG TABS, Take by mouth., Disp: , Rfl:  .  cyclobenzaprine (FLEXERIL) 5 MG tablet, Take 1 tablet (5 mg total) by mouth at bedtime as needed for muscle spasms., Disp: 30 tablet, Rfl: 0 .  diphenoxylate-atropine (LOMOTIL) 2.5-0.025 MG tablet, Take 1 tablet by mouth 4 (four) times daily as needed for up to 30 days for diarrhea or loose stools. **PT NEEDS FOLLOW UP APPT**, Disp: 120 tablet, Rfl: 0 .  hydrOXYzine (ATARAX/VISTARIL) 50 MG tablet, take 1 tablet (50MG )  by oral route 4 times every day prn itching insomnia, Disp: , Rfl:  .  SPRAVATO, 64 MG DOSE, 28 MG/DEVICE SOPK, , Disp: , Rfl:   EXAM:  VITALS per patient if applicable: 80/99, temp 99.6, pulse 85  GENERAL: alert, oriented, appears well and in no acute distress  HEENT: atraumatic, conjunttiva clear, no obvious abnormalities on inspection of external nose and ears  NECK: normal movements of the head and neck  LUNGS: on inspection no signs of respiratory distress, breathing rate appears normal, no obvious gross SOB, gasping or wheezing  CV: no obvious cyanosis  PSYCH/NEURO: pleasant and cooperative, no obvious depression or anxiety, speech and thought processing grossly intact  ASSESSMENT AND PLAN:  Discussed the following assessment and plan:  ANXIETY DEPRESSION  Diarrhea, unspecified type  Other fatigue  Gastroesophageal reflux disease, esophagitis presence not  specified  Hypertrophic obstructive cardiomyopathy (HCC)  Hypothyroidism, unspecified type  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  Seeing Dr Toy Care.  Increased stress as outlined.  Discussed with her today.  She desires no further intervention at this time.  Follow.    Diarrhea, unspecified Has had extensive GI w/up.  Stable.   Fatigue Increased fatigue with associated symptoms as outlined.  Discussed possible viral illness. Discussed possible COVID.  She desires not to be tested at this time.  Keep hydrated.  Try to encourage increased po intake.  Heart overall stable.  Discussed self quarantine.  Treat acid reflux.    GERD (gastroesophageal reflux disease) On protonix. The cough and drainage - question if reflux contributing.  Add pepcid in the evening.  Treat drainage.  Hypertrophic obstructive cardiomyopathy S/p septal myectomy.  Followed by Dr Mina Marble.  Felt stable.  S/p defibrillator placement.  Follow.   Hypothyroidism On thyroid replacement.  Follow tsh.   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  Follow cbc and ferritin.   Fever With cough, congestion and low grade fever.  Tmax 100.5. Discussed possible etiologies.  Neighbor died of COVID.  Had no direct contact.  [place]ives in [place].  Discussed testing.  She prefers not to be tested.  Discussed the need for self quarantine.  Stay hydrated.  Treat acid reflux.  Add pepcid.  nasacort nasal spray to help with drainage.  Encourage increased po intake.  Schedule f/u in the next few days to reassess.  Any change or worsening symptoms, she is to be reevaluated.      I discussed the assessment and treatment plan with the patient. The patient was provided an opportunity to ask questions and all were answered. The patient agreed with the plan and demonstrated an understanding of the instructions.   The patient  was advised to call back or seek an in-person evaluation if the symptoms worsen or if the condition fails to improve as anticipated.  I provided 45 minutes of non-face-to-face time during this encounter.   Einar Pheasant, MD

## 2018-10-13 ENCOUNTER — Ambulatory Visit (INDEPENDENT_AMBULATORY_CARE_PROVIDER_SITE_OTHER): Payer: Managed Care, Other (non HMO) | Admitting: Internal Medicine

## 2018-10-13 ENCOUNTER — Other Ambulatory Visit: Payer: Self-pay

## 2018-10-13 ENCOUNTER — Telehealth: Payer: Self-pay | Admitting: Internal Medicine

## 2018-10-13 ENCOUNTER — Encounter: Payer: Self-pay | Admitting: Internal Medicine

## 2018-10-13 DIAGNOSIS — R5383 Other fatigue: Secondary | ICD-10-CM

## 2018-10-13 DIAGNOSIS — R509 Fever, unspecified: Secondary | ICD-10-CM

## 2018-10-13 DIAGNOSIS — K219 Gastro-esophageal reflux disease without esophagitis: Secondary | ICD-10-CM

## 2018-10-13 DIAGNOSIS — F341 Dysthymic disorder: Secondary | ICD-10-CM | POA: Diagnosis not present

## 2018-10-13 DIAGNOSIS — D508 Other iron deficiency anemias: Secondary | ICD-10-CM

## 2018-10-13 DIAGNOSIS — I421 Obstructive hypertrophic cardiomyopathy: Secondary | ICD-10-CM

## 2018-10-13 DIAGNOSIS — E039 Hypothyroidism, unspecified: Secondary | ICD-10-CM

## 2018-10-13 DIAGNOSIS — I10 Essential (primary) hypertension: Secondary | ICD-10-CM

## 2018-10-13 NOTE — Telephone Encounter (Signed)
My chart message sent to pt for update.   

## 2018-10-13 NOTE — Progress Notes (Signed)
Patient ID: Carol Mahoney, female   DOB: July 26, 1975, 43 y.o.   MRN: [id number]   Virtual Visit via video Note  This visit type was conducted due to national recommendations for restrictions regarding the COVID-19 pandemic (e.g. social distancing).  This format is felt to be most appropriate for this patient at this time.  All issues noted in this document were discussed and addressed.  No physical exam was performed (except for noted visual exam findings with Video Visits).   I connected with [name] by a video enabled telemedicine application and verified that I am speaking with the correct person using two identifiers. Location patient: home Location provider: work Persons participating in the virtual visit: patient, provider  I discussed the limitations, risks, security and privacy concerns of performing an evaluation and management service by video and the availability of in person appointments. The patient expressed understanding and agreed to proceed.   Reason for visit: scheduled follow up.   HPI: Just evaluated a few days ago for low grade fever, congestion and nausea.  Was started on pepcid and nasacort.  Nausea is better.  Eating better.  Appetite better.  Just started the nasacort last night. No vomiting.  No sob.  Overall feels her breathing is stable. No chest pain.  Temp - 100.4.  Not taking tyelnol or ibuprofen.  Blood pressure 95/57 and pulse 88.  Discussed again with her regarding possible COVID.  See last note.  Discussed the need for continued self quarantine.     ROS: See pertinent positives and negatives per HPI.  Past Medical History:  Diagnosis Date  . Anemia   . Anxiety   . Depression   . GERD (gastroesophageal reflux disease)   . H/O hiatal hernia   . Hypertension   . Hypertr obst cardiomyop    mitral regurgitation, LAE - followed at [facility]  . Hypothyroidism   . Migraine headache   . PONV (postoperative nausea and vomiting)     Past Surgical History:   Procedure Laterality Date  . BUNIONECTOMY    . CHOLECYSTECTOMY    . HIATAL HERNIA REPAIR    . IMPLANTABLE CARDIOVERTER DEFIBRILLATOR REVISION  05/21/14   [facility]  . PARTIAL GASTRECTOMY    . TONSILLECTOMY      Family History  Problem Relation Age of Onset  . Anesthesia problems Father   . Hypertension Mother   . Hashimoto's thyroiditis Maternal Grandmother   . Hypertension Maternal Grandmother   . Breast cancer Neg Hx   . Colon cancer Neg Hx     SOCIAL HX: reviewed.    Current Outpatient Medications:  .  acetaminophen (TYLENOL) 500 MG tablet, Take 500 mg by mouth every 6 (six) hours as needed. For headache, Disp: , Rfl:  .  alosetron (LOTRONEX) 1 MG tablet, Take 1 tablet (1 mg total) by mouth 2 (two) times daily., Disp: 180 tablet, Rfl: 0 .  ALPRAZolam (XANAX) 0.5 MG tablet, 0.5 mg 3 (three) times daily as needed. , Disp: , Rfl:  .  cyclobenzaprine (FLEXERIL) 5 MG tablet, Take 1 tablet (5 mg total) by mouth at bedtime as needed for muscle spasms., Disp: 30 tablet, Rfl: 0 .  EPINEPHrine (EPIPEN 2-PAK) 0.3 mg/0.3 mL IJ SOAJ injection, USE AS DIRECTED ON PACKAGE, Disp: 2 Device, Rfl: 1 .  furosemide (LASIX) 20 MG tablet, Take 20 mg by mouth daily as needed. , Disp: , Rfl:  .  hydrOXYzine (ATARAX/VISTARIL) 50 MG tablet, take 1 tablet (50MG )  by oral route  4 times every day prn itching insomnia, Disp: , Rfl:  .  levothyroxine (SYNTHROID) 88 MCG tablet, TAKE 1 TABLET(88 MCG) BY MOUTH DAILY, Disp: 90 tablet, Rfl: 0 .  metoprolol tartrate (LOPRESSOR) 25 MG tablet, Take 25 mg by mouth 2 (two) times daily. 50mg  in the morning 25 mg at night, Disp: , Rfl:  .  ondansetron (ZOFRAN) 4 MG tablet, Take 1 tablet (4 mg total) by mouth 2 (two) times daily as needed for nausea or vomiting., Disp: 20 tablet, Rfl: 0 .  pantoprazole (PROTONIX) 40 MG tablet, TAKE 1 TABLET BY MOUTH  DAILY, Disp: 90 tablet, Rfl: 3 .  SPRAVATO, 84 MG DOSE, 28 MG/DEVICE SOPK, , Disp: , Rfl:  .  SUMAtriptan (IMITREX) 50 MG  tablet, Take 1 tablet by mouth  every 2 hours as needed for migraine(s), Disp: 54 tablet, Rfl: 2 .  traZODone (DESYREL) 50 MG tablet, 50 mg. Takes 1/2 tablet as needed at bedtime, Disp: , Rfl:  .  vitamin B-12 (CYANOCOBALAMIN) 1000 MCG tablet, Take 1 tablet (1,000 mcg total) by mouth daily., Disp: 30 tablet, Rfl: 3 .  vortioxetine HBr (TRINTELLIX) 10 MG TABS, Take by mouth., Disp: , Rfl:  .  diphenoxylate-atropine (LOMOTIL) 2.5-0.025 MG tablet, Take 1 tablet by mouth 4 (four) times daily as needed for up to 30 days for diarrhea or loose stools. **PT NEEDS FOLLOW UP APPT**, Disp: 120 tablet, Rfl: 0  EXAM:  VITALS per patient if applicable:  31/54, pulse 88.   GENERAL: alert, oriented, appears well and in no acute distress  HEENT: atraumatic, conjunttiva clear, no obvious abnormalities on inspection of external nose and ears  NECK: normal movements of the head and neck  LUNGS: on inspection no signs of respiratory distress, breathing rate appears normal, no obvious gross SOB, gasping or wheezing  CV: no obvious cyanosis  PSYCH/NEURO: pleasant and cooperative, no obvious depression or anxiety, speech and thought processing grossly intact  ASSESSMENT AND PLAN:  Discussed the following assessment and plan:  ANXIETY DEPRESSION  Other fatigue  Fever, unspecified fever cause  Gastroesophageal reflux disease, esophagitis presence not specified  Essential hypertension  Hypertrophic obstructive cardiomyopathy (HCC)  Hypothyroidism, unspecified type  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  Seeing Dr Toy Care.  Discussed increased stress with her.  Wants to hold on any further intervention at this time.  Follow.   Fatigue Energy has improved.  Feeling better.  Eating better.  Appetite better.  Still with low grade temp and some congestion.  Continue nasacort and pepcid.    Fever Persistent low grade temp.  See last note. Does feel  better.  Some drainage.  Continue pepcid and nasacort as directed.  Discussed COVID testing.  She prefers to hold on testing.  Continue self quarantine.  Call next week with update.  Follow closely.  If any worsening change or problems, she is to call or be reevaluated.    GERD (gastroesophageal reflux disease) Controlled on current regimen.    Hypertension Pressure as outlined.  Follow.    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  Followed by Dr Mina Marble. S/p defibrillator placement.  Follow.    Hypothyroidism On thyroid replacement.  Follow tsh.   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  Follow cbc and ferritin.     I discussed the assessment and treatment plan with the patient. The patient was provided an opportunity to ask questions and all were answered. The patient  agreed with the plan and demonstrated an understanding of the instructions.   The patient was advised to call back or seek an in-person evaluation if the symptoms worsen or if the condition fails to improve as anticipated.    Einar Pheasant, MD

## 2018-10-15 ENCOUNTER — Encounter: Payer: Self-pay | Admitting: Internal Medicine

## 2018-10-15 DIAGNOSIS — R509 Fever, unspecified: Secondary | ICD-10-CM | POA: Insufficient documentation

## 2018-10-15 NOTE — Assessment & Plan Note (Signed)
Previously saw Dr Grayland Ormond.  Follow cbc and ferritin.

## 2018-10-15 NOTE — Assessment & Plan Note (Signed)
On thyroid replacement.  Follow tsh.  

## 2018-10-15 NOTE — Assessment & Plan Note (Signed)
Pressure as outlined.  Follow.

## 2018-10-15 NOTE — Assessment & Plan Note (Signed)
Increased fatigue with associated symptoms as outlined.  Discussed possible viral illness. Discussed possible COVID.  She desires not to be tested at this time.  Keep hydrated.  Try to encourage increased po intake.  Heart overall stable.  Discussed self quarantine.  Treat acid reflux.

## 2018-10-15 NOTE — Assessment & Plan Note (Signed)
Controlled on current regimen.   

## 2018-10-15 NOTE — Assessment & Plan Note (Signed)
S/p septal myectomy.  Followed by Dr Mina Marble.  Felt stable.  S/p defibrillator placement.  Follow.

## 2018-10-15 NOTE — Assessment & Plan Note (Signed)
Followed by psychiatry.  Seeing Dr Toy Care.  Increased stress as outlined.  Discussed with her today.  She desires no further intervention at this time.  Follow.

## 2018-10-15 NOTE — Assessment & Plan Note (Signed)
Has had extensive GI w/up.  Stable.

## 2018-10-15 NOTE — Assessment & Plan Note (Signed)
Persistent low grade temp.  See last note. Does feel better.  Some drainage.  Continue pepcid and nasacort as directed.  Discussed COVID testing.  She prefers to hold on testing.  Continue self quarantine.  Call next week with update.  Follow closely.  If any worsening change or problems, she is to call or be reevaluated.

## 2018-10-15 NOTE — Assessment & Plan Note (Signed)
With cough, congestion and low grade fever.  Tmax 100.5. Discussed possible etiologies.  Neighbor died of COVID.  Had no direct contact.  Lives in [place].  Discussed testing.  She prefers not to be tested.  Discussed the need for self quarantine.  Stay hydrated.  Treat acid reflux.  Add pepcid.  nasacort nasal spray to help with drainage.  Encourage increased po intake.  Schedule f/u in the next few days to reassess.  Any change or worsening symptoms, she is to be reevaluated.

## 2018-10-15 NOTE — Assessment & Plan Note (Signed)
Followed by psychiatry.  Seeing Dr Toy Care.  Discussed increased stress with her.  Wants to hold on any further intervention at this time.  Follow.

## 2018-10-15 NOTE — Assessment & Plan Note (Signed)
S/p septal myomectomy.  Followed by Dr Mina Marble. S/p defibrillator placement.  Follow.

## 2018-10-15 NOTE — Assessment & Plan Note (Signed)
On protonix. The cough and drainage - question if reflux contributing.  Add pepcid in the evening.  Treat drainage.

## 2018-10-15 NOTE — Assessment & Plan Note (Signed)
Energy has improved.  Feeling better.  Eating better.  Appetite better.  Still with low grade temp and some congestion.  Continue nasacort and pepcid.

## 2018-10-16 ENCOUNTER — Other Ambulatory Visit: Payer: Self-pay | Admitting: Internal Medicine

## 2018-10-16 MED ORDER — MUPIROCIN 2 % EX OINT: TOPICAL_OINTMENT | CUTANEOUS | 0 refills | Status: DC

## 2018-10-16 NOTE — Progress Notes (Signed)
rx sent in for bactroban.  See my chart message.

## 2018-10-19 ENCOUNTER — Other Ambulatory Visit: Payer: Managed Care, Other (non HMO)

## 2018-10-19 ENCOUNTER — Encounter: Payer: Self-pay | Admitting: Internal Medicine

## 2018-10-19 ENCOUNTER — Ambulatory Visit (INDEPENDENT_AMBULATORY_CARE_PROVIDER_SITE_OTHER): Payer: Managed Care, Other (non HMO) | Admitting: Internal Medicine

## 2018-10-19 ENCOUNTER — Telehealth: Payer: Self-pay | Admitting: Internal Medicine

## 2018-10-19 DIAGNOSIS — Z20822 Contact with and (suspected) exposure to covid-19: Secondary | ICD-10-CM

## 2018-10-19 DIAGNOSIS — F341 Dysthymic disorder: Secondary | ICD-10-CM

## 2018-10-19 DIAGNOSIS — R5383 Other fatigue: Secondary | ICD-10-CM

## 2018-10-19 DIAGNOSIS — I421 Obstructive hypertrophic cardiomyopathy: Secondary | ICD-10-CM

## 2018-10-19 DIAGNOSIS — K219 Gastro-esophageal reflux disease without esophagitis: Secondary | ICD-10-CM

## 2018-10-19 DIAGNOSIS — R509 Fever, unspecified: Secondary | ICD-10-CM | POA: Diagnosis not present

## 2018-10-19 NOTE — Addendum Note (Signed)
Addended by: Denman George on: 10/19/2018 09:23 AM   Modules accepted: Orders

## 2018-10-19 NOTE — Telephone Encounter (Signed)
Pt has been scheduled for Covid-19 testing. Pt was referred by Einar Pheasant, MD.   Scheduled with pt at [facility].

## 2018-10-19 NOTE — Assessment & Plan Note (Signed)
Continue protonix in am and pepcid in the pm.  Follow symptoms.  Appears this regimen is helping.  If persistent problems, can increase pepcid.

## 2018-10-19 NOTE — Assessment & Plan Note (Signed)
S/p septal myomectomy.  Followed by Dr Dixie Dials.  S/p defibrillator placement.  Follow.

## 2018-10-19 NOTE — Progress Notes (Signed)
Patient ID: Carol Mahoney, female   DOB: 1975-11-11, 43 y.o.   MRN: [id number]   Virtual Visit via video Note  This visit type was conducted due to national recommendations for restrictions regarding the COVID-19 pandemic (e.g. social distancing).  This format is felt to be most appropriate for this patient at this time.  All issues noted in this document were discussed and addressed.  No physical exam was performed (except for noted visual exam findings with Video Visits).   I connected with Carol Mahoney by a video enabled telemedicine application and verified that I am speaking with the correct person using two identifiers. Location patient: home Location provider: work Persons participating in the virtual visit: patient, provider  I discussed the limitations, risks, security and privacy concerns of performing an evaluation and management service by video and the availability of in person appointments. The patient expressed understanding and agreed to proceed.  Interactive audio and video telecommunications were attempted between this provider and patient and were successful for most of the visit.  Due to increased static, we had to complete the visit via telephone.   We continued and completed visit with audio only. Pt was in agreement.   Reason for visit: follow up visit  HPI: This is a scheduled follow up from her 10/13/18 visit.  Still with some congestion and fever - 10/13/18.  Since that appt, notice the end of her toe - red.  Started on [place]. She also has developed a bad taste in her mouth.  States tasted like metal.  She was questioning if could be related to nasacort.  She stopped the nasacort two days ago.  Feels may be improving.  Still having persistent fever with Tmax yesterday am 100.9.  100.4 this am.  Not taking tylenol or antiinflammatories.  Still has a cough - non productive.  No sinus pressure or nasal congestion.  Sometimes will notice thick mucus in her throat.  No chest  congestion, chest tightness or chest pain.  Breathing stable and bowels stable.  Taking protonix in am and pepcid in the pm.  Helping with nausea.  No vomiting.  Feels fatigued.  Is staying in - self quarantine.  Discussed again with her today and discussed her daughter - self quarantine.     ROS: See pertinent positives and negatives per HPI.  Past Medical History:  Diagnosis Date   Anemia    Anxiety    Depression    GERD (gastroesophageal reflux disease)    H/O hiatal hernia    Hypertension    Hypertr obst cardiomyop    mitral regurgitation, LAE - followed at [facility]   Hypothyroidism    Migraine headache    PONV (postoperative nausea and vomiting)     Past Surgical History:  Procedure Laterality Date   BUNIONECTOMY     CHOLECYSTECTOMY     HIATAL HERNIA REPAIR     IMPLANTABLE CARDIOVERTER DEFIBRILLATOR REVISION  05/21/14   [facility]   PARTIAL GASTRECTOMY     TONSILLECTOMY      Family History  Problem Relation Age of Onset   Anesthesia problems Father    Hypertension Mother    Hashimoto's thyroiditis Maternal Grandmother    Hypertension Maternal Grandmother    Breast cancer Neg Hx    Colon cancer Neg Hx     SOCIAL HX: reviewed.    Current Outpatient Medications:    acetaminophen (TYLENOL) 500 MG tablet, Take 500 mg by mouth every 6 (six) hours as needed. For headache, Disp: ,  Rfl:    alosetron (LOTRONEX) 1 MG tablet, Take 1 tablet (1 mg total) by mouth 2 (two) times daily., Disp: 180 tablet, Rfl: 0   ALPRAZolam (XANAX) 0.5 MG tablet, 0.5 mg 3 (three) times daily as needed. , Disp: , Rfl:    cyclobenzaprine (FLEXERIL) 5 MG tablet, Take 1 tablet (5 mg total) by mouth at bedtime as needed for muscle spasms., Disp: 30 tablet, Rfl: 0   diphenoxylate-atropine (LOMOTIL) 2.5-0.025 MG tablet, Take 1 tablet by mouth 4 (four) times daily as needed for up to 30 days for diarrhea or loose stools. **PT NEEDS FOLLOW UP APPT**, Disp: 120 tablet, Rfl: 0    EPINEPHrine (EPIPEN 2-PAK) 0.3 mg/0.3 mL IJ SOAJ injection, USE AS DIRECTED ON PACKAGE, Disp: 2 Device, Rfl: 1   furosemide (LASIX) 20 MG tablet, Take 20 mg by mouth daily as needed. , Disp: , Rfl:    hydrOXYzine (ATARAX/VISTARIL) 50 MG tablet, take 1 tablet (50MG )  by oral route 4 times every day prn itching insomnia, Disp: , Rfl:    levothyroxine (SYNTHROID) 88 MCG tablet, TAKE 1 TABLET(88 MCG) BY MOUTH DAILY, Disp: 90 tablet, Rfl: 0   metoprolol tartrate (LOPRESSOR) 25 MG tablet, Take 25 mg by mouth 2 (two) times daily. 50mg  in the morning 25 mg at night, Disp: , Rfl:    mupirocin ointment (BACTROBAN) 2 %, Apply to affected area bid., Disp: 22 g, Rfl: 0   ondansetron (ZOFRAN) 4 MG tablet, Take 1 tablet (4 mg total) by mouth 2 (two) times daily as needed for nausea or vomiting., Disp: 20 tablet, Rfl: 0   pantoprazole (PROTONIX) 40 MG tablet, TAKE 1 TABLET BY MOUTH  DAILY, Disp: 90 tablet, Rfl: 3   SPRAVATO, 84 MG DOSE, 28 MG/DEVICE SOPK, , Disp: , Rfl:    SUMAtriptan (IMITREX) 50 MG tablet, Take 1 tablet by mouth  every 2 hours as needed for migraine(s), Disp: 54 tablet, Rfl: 2   traZODone (DESYREL) 50 MG tablet, 50 mg. Takes 1/2 tablet as needed at bedtime, Disp: , Rfl:    vitamin B-12 (CYANOCOBALAMIN) 1000 MCG tablet, Take 1 tablet (1,000 mcg total) by mouth daily., Disp: 30 tablet, Rfl: 3   vortioxetine HBr (TRINTELLIX) 10 MG TABS, Take by mouth., Disp: , Rfl:   EXAM:  VITALS per patient if applicable:  132.4 and then recheck 99.5, blood pressure 92/54 and pulse 88  GENERAL: alert, oriented, appears well and in no acute distress  HEENT: atraumatic, conjunttiva clear, no obvious abnormalities on inspection of external nose and ears  NECK: normal movements of the head and neck  LUNGS: on inspection no signs of respiratory distress, breathing rate appears normal, no obvious gross SOB, gasping or wheezing  CV: no obvious cyanosis  SKIN:  Toe - no redness noted now.     PSYCH/NEURO: pleasant and cooperative, no obvious depression or anxiety, speech and thought processing grossly intact  ASSESSMENT AND PLAN:  Discussed the following assessment and plan:  ANXIETY DEPRESSION  Other fatigue  Fever, unspecified fever cause  Gastroesophageal reflux disease, esophagitis presence not specified  Hypertrophic obstructive cardiomyopathy (HCC)  ANXIETY DEPRESSION Followed by psychiatry.  Seeing Dr Toy Care.  Handling things relatively well.  Follow.    Fatigue Reports still with some fatigue.  States just tired of ongoing symptoms.  Follow.    Fever See previous notes.  Has subsequently developed a "bad taste" in her mouth.  Stopped nasacort.  May be some better.  Still with persistent cough and fever as  outlined.  Eating.  Discussed importance of staying hydrated.  Breathing stable.  No bowel change.  Discussed COVID testing given persistent symptoms.  She is in agreement.  She has been staying in and we again discussed the need to self quarantine.  Went over guidelines again with her.    GERD (gastroesophageal reflux disease) Continue protonix in am and pepcid in the pm.  Follow symptoms.  Appears this regimen is helping.  If persistent problems, can increase pepcid.    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  Followed by Dr Dixie Dials.  S/p defibrillator placement.  Follow.      I discussed the assessment and treatment plan with the patient. The patient was provided an opportunity to ask questions and all were answered. The patient agreed with the plan and demonstrated an understanding of the instructions.   The patient was advised to call back or seek an in-person evaluation if the symptoms worsen or if the condition fails to improve as anticipated.  I provided 25 minutes of non-face-to-face time during this encounter.   Einar Pheasant, MD

## 2018-10-19 NOTE — Assessment & Plan Note (Addendum)
See previous notes.  Has subsequently developed a "bad taste" in her mouth.  Stopped nasacort.  May be some better.  Still with persistent cough and fever as outlined.  Eating.  Discussed importance of staying hydrated.  Breathing stable.  No bowel change.  Discussed COVID testing given persistent symptoms.  She is in agreement.  She has been staying in and we again discussed the need to self quarantine.  Went over guidelines again with her.

## 2018-10-19 NOTE — Assessment & Plan Note (Signed)
Followed by psychiatry.  Seeing Dr Toy Care.  Handling things relatively well.  Follow.

## 2018-10-19 NOTE — Assessment & Plan Note (Signed)
Reports still with some fatigue.  States just tired of ongoing symptoms.  Follow.

## 2018-10-20 LAB — NOVEL CORONAVIRUS, NAA: SARS-CoV-2, NAA: NOT DETECTED

## 2018-10-22 ENCOUNTER — Encounter: Payer: Self-pay | Admitting: Internal Medicine

## 2018-10-23 ENCOUNTER — Telehealth: Payer: Self-pay | Admitting: Internal Medicine

## 2018-10-23 DIAGNOSIS — E039 Hypothyroidism, unspecified: Secondary | ICD-10-CM

## 2018-10-23 DIAGNOSIS — D508 Other iron deficiency anemias: Secondary | ICD-10-CM

## 2018-10-23 DIAGNOSIS — R509 Fever, unspecified: Secondary | ICD-10-CM

## 2018-10-23 DIAGNOSIS — R5383 Other fatigue: Secondary | ICD-10-CM

## 2018-10-23 DIAGNOSIS — I959 Hypotension, unspecified: Secondary | ICD-10-CM

## 2018-10-23 NOTE — Telephone Encounter (Signed)
I have placed the order for the lab and xray at [facility].  Please notify pt.  Needs to go to [facility].  Let us know if any problems.

## 2018-10-23 NOTE — Telephone Encounter (Signed)
I don't know if [facility] lab sends their blood to [facility].  They would run it at the hospital.  With her having fever, I am not sure she can go into the [facility] draw stations. Is she agreeable to have the labs and cxr at the hospital?  If needs labs at [facility], will need to find out if they can draw her with her having fever and cough.

## 2018-10-23 NOTE — Telephone Encounter (Signed)
Patient is agreeable to have everything done. 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 Advised that I would let her know once orders were placed so [facility].

## 2018-10-23 NOTE — Telephone Encounter (Signed)
Pt is aware and is okay with having everything done at [facility].

## 2018-10-23 NOTE — Telephone Encounter (Signed)
I notified her this weekend that her COVID test was negative.  She messaged me back and informed me she was still having fevers.  Given her persistent fever, she is going to need further w/up.  She needs labs and a cxr and urine test.  This will need to be done at the hospital.  [facility] and [facility].  I will place the orders of she is agreeable to go.  Let me know either way.

## 2018-10-24 ENCOUNTER — Ambulatory Visit
Admission: RE | Admit: 2018-10-24 | Discharge: 2018-10-24 | Disposition: A | Discharge status: Home / Self Care | Payer: Managed Care, Other (non HMO) | Attending: Internal Medicine | Admitting: Internal Medicine

## 2018-10-24 ENCOUNTER — Other Ambulatory Visit
Admission: RE | Admit: 2018-10-24 | Discharge: 2018-10-24 | Disposition: A | Discharge status: Home / Self Care | Payer: Managed Care, Other (non HMO) | Source: Home / Self Care | Attending: Internal Medicine | Admitting: Internal Medicine

## 2018-10-24 ENCOUNTER — Other Ambulatory Visit: Payer: Self-pay

## 2018-10-24 ENCOUNTER — Ambulatory Visit
Admission: RE | Admit: 2018-10-24 | Discharge: 2018-10-24 | Disposition: A | Discharge status: Home / Self Care | Payer: Managed Care, Other (non HMO) | Source: Ambulatory Visit | Attending: Internal Medicine | Admitting: Internal Medicine

## 2018-10-24 DIAGNOSIS — R509 Fever, unspecified: Secondary | ICD-10-CM | POA: Diagnosis not present

## 2018-10-24 DIAGNOSIS — D508 Other iron deficiency anemias: Secondary | ICD-10-CM

## 2018-10-24 DIAGNOSIS — E039 Hypothyroidism, unspecified: Secondary | ICD-10-CM | POA: Diagnosis present

## 2018-10-24 DIAGNOSIS — I959 Hypotension, unspecified: Secondary | ICD-10-CM | POA: Diagnosis present

## 2018-10-24 LAB — BASIC METABOLIC PANEL WITH GFR
Anion gap: 8 (ref 5–15)
BUN: 10 mg/dL (ref 6–20)
CO2: 27 mmol/L (ref 22–32)
Calcium: 8.3 mg/dL — ABNORMAL LOW (ref 8.9–10.3)
Chloride: 100 mmol/L (ref 98–111)
Creatinine, Ser: 0.76 mg/dL (ref 0.44–1.00)
GFR calc Af Amer: >60 mL/min (ref >60)
GFR calc non Af Amer: >60 mL/min (ref >60)
Glucose, Bld: 89 mg/dL (ref 70–99)
Potassium: 3.6 mmol/L (ref 3.5–5.1)
Sodium: 135 mmol/L (ref 135–145)

## 2018-10-24 LAB — CBC WITH DIFFERENTIAL/PLATELET
Abs Immature Granulocytes: 0.03 K/uL (ref 0.00–0.07)
Basophils Absolute: 0 K/uL (ref 0.0–0.1)
Basophils Relative: 1 %
Eosinophils Absolute: 0 K/uL (ref 0.0–0.5)
Eosinophils Relative: 0 %
HCT: 34.5 % — ABNORMAL LOW (ref 36.0–46.0)
Hemoglobin: 11.2 g/dL — ABNORMAL LOW (ref 12.0–15.0)
Immature Granulocytes: 1 %
Lymphocytes Relative: 18 %
Lymphs Abs: 0.9 K/uL (ref 0.7–4.0)
MCH: 27.4 pg (ref 26.0–34.0)
MCHC: 32.5 g/dL (ref 30.0–36.0)
MCV: 84.4 fL (ref 80.0–100.0)
Monocytes Absolute: 0.3 K/uL (ref 0.1–1.0)
Monocytes Relative: 6 %
Neutro Abs: 4 K/uL (ref 1.7–7.7)
Neutrophils Relative %: 74 %
Platelets: 190 K/uL (ref 150–400)
RBC: 4.09 MIL/uL (ref 3.87–5.11)
RDW: 13.3 % (ref 11.5–15.5)
WBC: 5.3 K/uL (ref 4.0–10.5)
nRBC: 0 % (ref 0.0–0.2)

## 2018-10-24 LAB — URINALYSIS, ROUTINE W REFLEX MICROSCOPIC
Bilirubin Urine: NEGATIVE
Glucose, UA: NEGATIVE mg/dL
Hgb urine dipstick: NEGATIVE
Ketones, ur: NEGATIVE mg/dL
Leukocytes,Ua: NEGATIVE
Nitrite: NEGATIVE
Protein, ur: 30 mg/dL — AB
Specific Gravity, Urine: 1.023 (ref 1.005–1.030)
pH: 5 (ref 5.0–8.0)

## 2018-10-24 LAB — FERRITIN: Ferritin: 37 ng/mL (ref 11–307)

## 2018-10-24 LAB — HEPATIC FUNCTION PANEL
ALT: 9 U/L (ref 0–44)
AST: 17 U/L (ref 15–41)
Albumin: 3 g/dL — ABNORMAL LOW (ref 3.5–5.0)
Alkaline Phosphatase: 102 U/L (ref 38–126)
Bilirubin, Direct: 0.2 mg/dL (ref 0.0–0.2)
Indirect Bilirubin: 0.7 mg/dL (ref 0.3–0.9)
Total Bilirubin: 0.9 mg/dL (ref 0.3–1.2)
Total Protein: 6.8 g/dL (ref 6.5–8.1)

## 2018-10-24 LAB — TSH: TSH: 3.916 u[IU]/mL (ref 0.350–4.500)

## 2018-10-24 NOTE — Telephone Encounter (Signed)
Pt aware and will call if she needs anything

## 2018-10-25 LAB — BLOOD CULTURE ID PANEL (REFLEXED)

## 2018-10-25 LAB — URINE CULTURE: Culture: NO GROWTH

## 2018-10-27 LAB — CULTURE, BLOOD (ROUTINE X 2): Special Requests: ADEQUATE

## 2018-10-31 DIAGNOSIS — I340 Nonrheumatic mitral (valve) insufficiency: Secondary | ICD-10-CM | POA: Insufficient documentation

## 2018-11-04 MED ORDER — CVS CHILDRENS NASAL DECONGEST 15 MG/5ML PO LIQD
88.00 | ORAL | Status: DC
Start: 2018-11-05 — End: 2018-11-04

## 2018-11-04 MED ORDER — MELATONIN 3 MG PO TABS
3.00 | ORAL_TABLET | ORAL | Status: DC
Start: ? — End: 2018-11-04

## 2018-11-04 MED ORDER — BALTUSSIN HC PO
10.00 | ORAL | Status: DC
Start: ? — End: 2018-11-04

## 2018-11-04 MED ORDER — LONGS PAIN RELIEVER EX ST 500 MG PO TABS
50.00 | ORAL_TABLET | ORAL | Status: DC
Start: ? — End: 2018-11-04

## 2018-11-04 MED ORDER — FAMOTIDINE 20 MG PO TABS
20.00 | ORAL_TABLET | ORAL | Status: DC
Start: 2018-11-04 — End: 2018-11-04

## 2018-11-04 MED ORDER — Medication
5.00 | Status: DC
Start: ? — End: 2018-11-04

## 2018-11-04 MED ORDER — CERALYTE 50 1.3-2.2-2.9 GM/L PO PACK
1.00 | PACK | ORAL | Status: DC
Start: 2018-11-04 — End: 2018-11-04

## 2018-11-04 MED ORDER — Medication
Status: DC
Start: ? — End: 2018-11-04

## 2018-11-04 MED ORDER — METOPROLOL SUCCINATE ER 25 MG PO TB24
12.50 | ORAL_TABLET | ORAL | Status: DC
Start: 2018-11-05 — End: 2018-11-04

## 2018-11-04 MED ORDER — QUINERVA 260 MG PO TABS
650.00 | ORAL_TABLET | ORAL | Status: DC
Start: ? — End: 2018-11-04

## 2018-11-04 MED ORDER — PANTOPRAZOLE SODIUM 40 MG PO TBEC
40.00 | DELAYED_RELEASE_TABLET | ORAL | Status: DC
Start: 2018-11-05 — End: 2018-11-04

## 2018-11-04 MED ORDER — Medication
2.00 | Status: DC
Start: 2018-11-04 — End: 2018-11-04

## 2018-11-04 MED ORDER — HAYFEBROL PO
ORAL | Status: DC
Start: ? — End: 2018-11-04

## 2018-11-04 MED ORDER — DIPHENOXYLATE-ATROPINE 2.5-0.025 MG PO TABS
1.00 | ORAL_TABLET | ORAL | Status: DC
Start: ? — End: 2018-11-04

## 2018-11-04 MED ORDER — LIDOCAINE HCL 1 % IJ SOLN
3.00 | INTRAMUSCULAR | Status: DC
Start: ? — End: 2018-11-04

## 2018-11-04 MED ORDER — VORTIOXETINE HBR 20 MG PO TABS
20.00 | ORAL_TABLET | ORAL | Status: DC
Start: 2018-11-05 — End: 2018-11-04

## 2018-11-04 MED ORDER — DURACLON EP
1000.00 | EPIDURAL | Status: DC
Start: 2018-11-05 — End: 2018-11-04

## 2018-11-04 MED ORDER — GENERIC EXTERNAL MEDICATION
1.00 | Status: DC
Start: ? — End: 2018-11-04

## 2018-11-04 MED ORDER — TUSSI PRES-B 2-15-200 MG/5ML PO LIQD
0.50 | ORAL | Status: DC
Start: ? — End: 2018-11-04

## 2018-11-04 MED ORDER — TRAZODONE HCL 50 MG PO TABS
25.00 | ORAL_TABLET | ORAL | Status: DC
Start: 2018-11-04 — End: 2018-11-04

## 2018-11-04 MED ORDER — PLASMA-LYTE R IV SOLN
1.00 | INTRAVENOUS | Status: DC
Start: 2018-11-04 — End: 2018-11-04

## 2018-11-04 MED ORDER — HYDROCODONE-ACETAMINOPHEN 10-250 MG PO TABS
0.25 | ORAL_TABLET | ORAL | Status: DC
Start: ? — End: 2018-11-04

## 2018-11-13 ENCOUNTER — Other Ambulatory Visit: Payer: Self-pay | Admitting: Gastroenterology

## 2018-11-13 DIAGNOSIS — R197 Diarrhea, unspecified: Secondary | ICD-10-CM

## 2018-11-17 ENCOUNTER — Ambulatory Visit (INDEPENDENT_AMBULATORY_CARE_PROVIDER_SITE_OTHER): Payer: Managed Care, Other (non HMO) | Admitting: Internal Medicine

## 2018-11-17 ENCOUNTER — Encounter: Payer: Self-pay | Admitting: Internal Medicine

## 2018-11-17 ENCOUNTER — Other Ambulatory Visit: Payer: Self-pay

## 2018-11-17 DIAGNOSIS — F341 Dysthymic disorder: Secondary | ICD-10-CM | POA: Diagnosis not present

## 2018-11-17 DIAGNOSIS — I10 Essential (primary) hypertension: Secondary | ICD-10-CM | POA: Diagnosis not present

## 2018-11-17 DIAGNOSIS — R21 Rash and other nonspecific skin eruption: Secondary | ICD-10-CM

## 2018-11-17 DIAGNOSIS — I421 Obstructive hypertrophic cardiomyopathy: Secondary | ICD-10-CM | POA: Diagnosis not present

## 2018-11-17 DIAGNOSIS — D508 Other iron deficiency anemias: Secondary | ICD-10-CM | POA: Diagnosis not present

## 2018-11-17 DIAGNOSIS — R7881 Bacteremia: Secondary | ICD-10-CM

## 2018-11-17 NOTE — Progress Notes (Signed)
Patient ID: Carol Mahoney, female   DOB: 1975-09-07, 43 y.o.   MRN: [id number]   Virtual Visit via video Note  This visit type was conducted due to national recommendations for restrictions regarding the COVID-19 pandemic (e.g. social distancing).  This format is felt to be most appropriate for this patient at this time.  All issues noted in this document were discussed and addressed.  No physical exam was performed (except for noted visual exam findings with Video Visits).   I connected with [name] by a video enabled telemedicine application or telephone and verified that I am speaking with the correct person using two identifiers. Location patient: home Location provider: work  Persons participating in the virtual visit: patient, provider  I discussed the limitations, risks, security and privacy concerns of performing an evaluation and management service by video and the availability of in person appointments. The patient expressed understanding and agreed to proceed.   Reason for visit: hospital follow up.   HPI: She was recently admitted at [facility] with bacteremia related to strep.  ICD removed.  On IV abx.  Has PICC line.  Was discharged on rocephin.  States that she had itching while in the hospital. Since being home, she has had continued itching. Some rash now.  She has contacted ID.  They have the rocephin on hold for now.  Trying to determine what abx to complete her treatment.  She is eating.  Feels better.  No nausea or vomiting.  No chest pain or tightness.  Breathing stable.  Off lasix.  Recently restarted metoprolol.  Following pressures.  mycelex troche - helping thrush.  No fever now.     ROS: See pertinent positives and negatives per HPI.  Past Medical History:  Diagnosis Date  . Anemia   . Anxiety   . Depression   . GERD (gastroesophageal reflux disease)   . H/O hiatal hernia   . Hypertension   . Hypertr obst cardiomyop    mitral regurgitation, LAE -  followed at [facility]  . Hypothyroidism   . Migraine headache   . PONV (postoperative nausea and vomiting)     Past Surgical History:  Procedure Laterality Date  . BUNIONECTOMY    . CHOLECYSTECTOMY    . HIATAL HERNIA REPAIR    . IMPLANTABLE CARDIOVERTER DEFIBRILLATOR REVISION  05/21/14   [facility]  . PARTIAL GASTRECTOMY    . TONSILLECTOMY      Family History  Problem Relation Age of Onset  . Anesthesia problems Father   . Hypertension Mother   . Hashimoto's thyroiditis Maternal Grandmother   . Hypertension Maternal Grandmother   . Breast cancer Neg Hx   . Colon cancer Neg Hx     SOCIAL HX: reviewed.    Current Outpatient Medications:  .  acetaminophen (TYLENOL) 500 MG tablet, Take 500 mg by mouth every 6 (six) hours as needed. For headache, Disp: , Rfl:  .  alosetron (LOTRONEX) 1 MG tablet, Take 1 tablet (1 mg total) by mouth 2 (two) times daily., Disp: 180 tablet, Rfl: 0 .  ALPRAZolam (XANAX) 0.5 MG tablet, 0.5 mg 3 (three) times daily as needed. , Disp: , Rfl:  .  clotrimazole (MYCELEX) 10 MG troche, DISSOLVE 1 TROCHE SLOWLY AND COMPLETELY IN MOUTH 5 TIMES DAILY FOR 14 DAYS, Disp: , Rfl:  .  cyclobenzaprine (FLEXERIL) 5 MG tablet, Take 1 tablet (5 mg total) by mouth at bedtime as needed for muscle spasms., Disp: 30 tablet, Rfl: 0 .  diphenoxylate-atropine (  LOMOTIL) 2.5-0.025 MG tablet, Take 1 tablet by mouth 4 (four) times daily as needed for up to 30 days for diarrhea or loose stools. **PT NEEDS FOLLOW UP APPT**, Disp: 120 tablet, Rfl: 0 .  EPINEPHrine (EPIPEN 2-PAK) 0.3 mg/0.3 mL IJ SOAJ injection, USE AS DIRECTED ON PACKAGE, Disp: 2 Device, Rfl: 1 .  furosemide (LASIX) 20 MG tablet, Take 20 mg by mouth daily as needed. , Disp: , Rfl:  .  hydrOXYzine (ATARAX/VISTARIL) 10 MG tablet, TAKE 1 TABLET BY MOUTH EVERY 6 HOURS AS NEEDED FOR ITCHING FOR UP TO 14 DAYS, Disp: , Rfl:  .  levothyroxine (SYNTHROID) 88 MCG tablet, TAKE 1 TABLET(88 MCG) BY MOUTH DAILY, Disp: 90 tablet, Rfl: 0 .   metoprolol tartrate (LOPRESSOR) 25 MG tablet, Take 25 mg by mouth 2 (two) times daily. 50mg  in the morning 25 mg at night, Disp: , Rfl:  .  mupirocin ointment (BACTROBAN) 2 %, Apply to affected area bid., Disp: 22 g, Rfl: 0 .  ondansetron (ZOFRAN) 4 MG tablet, Take 1 tablet (4 mg total) by mouth 2 (two) times daily as needed for nausea or vomiting., Disp: 20 tablet, Rfl: 0 .  pantoprazole (PROTONIX) 40 MG tablet, TAKE 1 TABLET BY MOUTH  DAILY, Disp: 90 tablet, Rfl: 3 .  SPRAVATO, 84 MG DOSE, 28 MG/DEVICE SOPK, , Disp: , Rfl:  .  SUMAtriptan (IMITREX) 50 MG tablet, Take 1 tablet by mouth  every 2 hours as needed for migraine(s), Disp: 54 tablet, Rfl: 2 .  traZODone (DESYREL) 50 MG tablet, 50 mg. Takes 1/2 tablet as needed at bedtime, Disp: , Rfl:  .  TRINTELLIX 20 MG TABS tablet, TK 1 T PO QAM, Disp: , Rfl:  .  vitamin B-12 (CYANOCOBALAMIN) 1000 MCG tablet, Take 1 tablet (1,000 mcg total) by mouth daily., Disp: 30 tablet, Rfl: 3  EXAM:  VITALS per patient if applicable: 74/16, 77  GENERAL: alert, oriented, appears well and in no acute distress  HEENT: atraumatic, conjunttiva clear, no obvious abnormalities on inspection of external nose and ears  NECK: normal movements of the head and neck  LUNGS: on inspection no signs of respiratory distress, breathing rate appears normal, no obvious gross SOB, gasping or wheezing  CV: no obvious cyanosis  PSYCH/NEURO: pleasant and cooperative, no obvious depression or anxiety, speech and thought processing grossly intact  ASSESSMENT AND PLAN:  Discussed the following assessment and plan:  ANXIETY DEPRESSION Followed by psychiatry.  Stable.  Seeing Dr Toy Care.   Hypertension Has been having problems with low blood pressure.  Lasix to be used prn.  Will monitor weight.  Back on low dose metoprolol.    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  Followed by Dr Mina Marble.  Recent admission for bacteremia.  ICD removed.  On IV abx.     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    Bacteremia Recently admitted with bacteremia.  On IV abx.  Has PICC line.  Following with ID.  S/p removal of defibrillator.    Rash Rash.  Discussed with ID.  Rocephin on hold.  Discussing with ID regarding further abx to complete abx course.      I discussed the assessment and treatment plan with the patient. The patient was provided an opportunity to ask questions and all were answered. The patient agreed with the plan and demonstrated an understanding of the instructions.   The patient was advised to call back or seek an in-person evaluation if the symptoms  worsen or if the condition fails to improve as anticipated.   Einar Pheasant, MD

## 2018-11-19 ENCOUNTER — Encounter: Payer: Self-pay | Admitting: Internal Medicine

## 2018-11-19 DIAGNOSIS — R7881 Bacteremia: Secondary | ICD-10-CM | POA: Insufficient documentation

## 2018-11-19 DIAGNOSIS — R21 Rash and other nonspecific skin eruption: Secondary | ICD-10-CM | POA: Insufficient documentation

## 2018-11-19 NOTE — Assessment & Plan Note (Signed)
Recently admitted with bacteremia.  On IV abx.  Has PICC line.  Following with ID.  S/p removal of defibrillator.

## 2018-11-19 NOTE — Assessment & Plan Note (Signed)
Followed by psychiatry.  Stable.  Seeing Dr Toy Care.

## 2018-11-19 NOTE — Assessment & Plan Note (Signed)
S/p septal myomectomy.  Followed by Dr Mina Marble.  Recent admission for bacteremia.  ICD removed.  On IV abx.

## 2018-11-19 NOTE — Assessment & Plan Note (Signed)
Rash.  Discussed with ID.  Rocephin on hold.  Discussing with ID regarding further abx to complete abx course.

## 2018-11-19 NOTE — Assessment & Plan Note (Signed)
Follow cbc and ferritin.  

## 2018-11-19 NOTE — Assessment & Plan Note (Signed)
Has been having problems with low blood pressure.  Lasix to be used prn.  Will monitor weight.  Back on low dose metoprolol.

## 2018-11-20 ENCOUNTER — Encounter: Payer: Self-pay | Admitting: Internal Medicine

## 2018-12-05 ENCOUNTER — Encounter: Payer: Self-pay | Admitting: Internal Medicine

## 2019-02-26 ENCOUNTER — Other Ambulatory Visit: Payer: Self-pay

## 2019-02-28 ENCOUNTER — Ambulatory Visit: Payer: Managed Care, Other (non HMO) | Admitting: Internal Medicine

## 2019-02-28 ENCOUNTER — Other Ambulatory Visit: Payer: Self-pay

## 2019-02-28 DIAGNOSIS — I472 Ventricular tachycardia: Secondary | ICD-10-CM

## 2019-02-28 DIAGNOSIS — E039 Hypothyroidism, unspecified: Secondary | ICD-10-CM

## 2019-02-28 DIAGNOSIS — E559 Vitamin D deficiency, unspecified: Secondary | ICD-10-CM

## 2019-02-28 DIAGNOSIS — D696 Thrombocytopenia, unspecified: Secondary | ICD-10-CM

## 2019-02-28 DIAGNOSIS — R197 Diarrhea, unspecified: Secondary | ICD-10-CM

## 2019-02-28 DIAGNOSIS — F341 Dysthymic disorder: Secondary | ICD-10-CM

## 2019-02-28 DIAGNOSIS — K219 Gastro-esophageal reflux disease without esophagitis: Secondary | ICD-10-CM | POA: Diagnosis not present

## 2019-02-28 DIAGNOSIS — D508 Other iron deficiency anemias: Secondary | ICD-10-CM

## 2019-02-28 DIAGNOSIS — I4729 Other ventricular tachycardia: Secondary | ICD-10-CM

## 2019-02-28 DIAGNOSIS — I421 Obstructive hypertrophic cardiomyopathy: Secondary | ICD-10-CM | POA: Diagnosis not present

## 2019-02-28 NOTE — Progress Notes (Signed)
Patient ID: Carol Mahoney, female   DOB: 1976-02-11, 43 y.o.   MRN: [id number]   Subjective:    Patient ID: Carol Mahoney, female    DOB: 1975-05-30, 43 y.o.   MRN: [id number]  HPI  Patient here for a scheduled follow up.  Admitted 10/2018 with strep viridans bacteremia.  ICD removed.  Due to have replaced next week.  Working from home.  Increased stress.  Just found out her daughter is pregnant.  disucssed with her today.  Sees psychiatry.  Discussed counseling.  Will notify me if she feels needs any further intervention.  No chest pain.  Breathing better.  Energy is better.  Has lost weight.  Not eating due to increased stress.  Discussed increased po intake.     Past Medical History:  Diagnosis Date  . Anemia   . Anxiety   . Depression   . GERD (gastroesophageal reflux disease)   . H/O hiatal hernia   . Hypertension   . Hypertr obst cardiomyop    mitral regurgitation, LAE - followed at [facility]  . Hypothyroidism   . Migraine headache   . PONV (postoperative nausea and vomiting)    Past Surgical History:  Procedure Laterality Date  . BUNIONECTOMY    . CHOLECYSTECTOMY    . HIATAL HERNIA REPAIR    . IMPLANTABLE CARDIOVERTER DEFIBRILLATOR REVISION  05/21/14   [facility]  . PARTIAL GASTRECTOMY    . TONSILLECTOMY     Family History  Problem Relation Age of Onset  . Anesthesia problems Father   . Hypertension Mother   . Hashimoto's thyroiditis Maternal Grandmother   . Hypertension Maternal Grandmother   . Breast cancer Neg Hx   . Colon cancer Neg Hx    Social History   Socioeconomic History  . Marital status: Single    Spouse name: Not on file  . Number of children: 1  . Years of education: Not on file  . Highest education level: Not on file  Occupational History  . Occupation: PROGRAM Printmaker: [employer]  . Financial resource strain: Not on file  . Food insecurity    Worry: Not on file    Inability: Not on file  . Transportation needs    Medical:  Not on file    Non-medical: Not on file  Tobacco Use  . Smoking status: Never Smoker  . Smokeless tobacco: Never Used  Substance and Sexual Activity  . Alcohol use: No    Alcohol/week: 0.0 standard drinks  . Drug use: No  . Sexual activity: Not Currently  Lifestyle  . Physical activity    Days per week: Not on file    Minutes per session: Not on file  . Stress: Not on file  Relationships  . Social Herbalist on phone: Not on file    Gets together: Not on file    Attends religious service: Not on file    Active member of club or organization: Not on file    Attends meetings of clubs or organizations: Not on file    Relationship status: Not on file  Other Topics Concern  . Not on file  Social History Narrative  . Not on file    Outpatient Encounter Medications as of 02/28/2019  Medication Sig  . acetaminophen (TYLENOL) 500 MG tablet Take 500 mg by mouth every 6 (six) hours as needed. For headache  . alosetron (LOTRONEX) 1 MG tablet Take 1 tablet (1  mg total) by mouth 2 (two) times daily.  Marland Kitchen ALPRAZolam (XANAX) 0.5 MG tablet 0.5 mg 3 (three) times daily as needed.   . clotrimazole (MYCELEX) 10 MG troche DISSOLVE 1 TROCHE SLOWLY AND COMPLETELY IN MOUTH 5 TIMES DAILY FOR 14 DAYS  . cyclobenzaprine (FLEXERIL) 5 MG tablet Take 1 tablet (5 mg total) by mouth at bedtime as needed for muscle spasms.  . diphenoxylate-atropine (LOMOTIL) 2.5-0.025 MG tablet Take 1 tablet by mouth 4 (four) times daily as needed for up to 30 days for diarrhea or loose stools. **PT NEEDS FOLLOW UP APPT**  . EPINEPHrine (EPIPEN 2-PAK) 0.3 mg/0.3 mL IJ SOAJ injection USE AS DIRECTED ON PACKAGE  . furosemide (LASIX) 20 MG tablet Take 20 mg by mouth daily as needed.   Marland Kitchen levothyroxine (SYNTHROID) 88 MCG tablet TAKE 1 TABLET(88 MCG) BY MOUTH DAILY  . mupirocin ointment (BACTROBAN) 2 % Apply to affected area bid.  . ondansetron (ZOFRAN) 4 MG tablet Take 1 tablet (4 mg total) by mouth 2 (two) times daily as  needed for nausea or vomiting.  . pantoprazole (PROTONIX) 40 MG tablet TAKE 1 TABLET BY MOUTH  DAILY  . SUMAtriptan (IMITREX) 50 MG tablet Take 1 tablet by mouth  every 2 hours as needed for migraine(s)  . traZODone (DESYREL) 50 MG tablet 50 mg. Takes 1/2 tablet as needed at bedtime  . TRINTELLIX 20 MG TABS tablet TK 1 T PO QAM  . vitamin B-12 (CYANOCOBALAMIN) 1000 MCG tablet Take 1 tablet (1,000 mcg total) by mouth daily.  . [DISCONTINUED] hydrOXYzine (ATARAX/VISTARIL) 10 MG tablet TAKE 1 TABLET BY MOUTH EVERY 6 HOURS AS NEEDED FOR ITCHING FOR UP TO 14 DAYS  . [DISCONTINUED] metoprolol tartrate (LOPRESSOR) 25 MG tablet Take 25 mg by mouth 2 (two) times daily. 50mg  in the morning 25 mg at night  . [DISCONTINUED] SPRAVATO, 47 MG DOSE, 28 MG/DEVICE SOPK    No facility-administered encounter medications on file as of 02/28/2019.    Review of Systems  Constitutional: Negative for appetite change and unexpected weight change.  HENT: Negative for congestion and sinus pressure.   Respiratory: Negative for cough, chest tightness and shortness of breath.   Cardiovascular: Negative for chest pain, palpitations and leg swelling.  Gastrointestinal: Negative for abdominal pain, nausea and vomiting.       Bowels stable.   Genitourinary: Negative for difficulty urinating and dysuria.  Musculoskeletal: Negative for joint swelling and myalgias.  Skin: Negative for color change and rash.  Neurological: Negative for dizziness, light-headedness and headaches.  Psychiatric/Behavioral: Negative for agitation and dysphoric mood.       Objective:    Physical Exam Constitutional:      General: She is not in acute distress.    Appearance: Normal appearance.  HENT:     Right Ear: External ear normal.     Left Ear: External ear normal.  Eyes:     General: No scleral icterus.       Right eye: No discharge.        Left eye: No discharge.     Conjunctiva/sclera: Conjunctivae normal.  Neck:      Musculoskeletal: Neck supple. No muscular tenderness.     Thyroid: No thyromegaly.  Cardiovascular:     Rate and Rhythm: Normal rate and regular rhythm.     Comments: [id number] systolic murmur Pulmonary:     Effort: No respiratory distress.     Breath sounds: Normal breath sounds. No wheezing.  Abdominal:     General: Bowel  sounds are normal.     Palpations: Abdomen is soft.     Tenderness: There is no abdominal tenderness.  Musculoskeletal:        General: No swelling or tenderness.  Lymphadenopathy:     Cervical: No cervical adenopathy.  Skin:    Findings: No erythema or rash.  Neurological:     Mental Status: She is alert.  Psychiatric:        Mood and Affect: Mood normal.        Behavior: Behavior normal.     BP 94/64   Pulse 66   Temp (!) 97.4 F (36.3 C)   Resp 16   Wt 134 lb (60.8 kg)   LMP 10/08/2011   SpO2 99%   BMI 20.99 kg/m  Wt Readings from Last 3 Encounters:  02/28/19 134 lb (60.8 kg)  07/11/18 142 lb 12.8 oz (64.8 kg)  04/05/18 146 lb 12 oz (66.6 kg)     Lab Results  Component Value Date   WBC 5.3 10/24/2018   HGB 11.2 (L) 10/24/2018   HCT 34.5 (L) 10/24/2018   PLT 190 10/24/2018   GLUCOSE 89 10/24/2018   CHOL 155 04/03/2018   TRIG 43 04/03/2018   HDL 60 04/03/2018   LDLCALC 86 04/03/2018   ALT 9 10/24/2018   AST 17 10/24/2018   NA 135 10/24/2018   K 3.6 10/24/2018   CL 100 10/24/2018   CREATININE 0.76 10/24/2018   BUN 10 10/24/2018   CO2 27 10/24/2018   TSH 3.916 10/24/2018   INR 1.0 09/26/2014   HGBA1C 4.9 12/12/2014    Dg Chest 2 View  Result Date: 10/24/2018 CLINICAL DATA:  Fever and cough for 1 month EXAM: CHEST - 2 VIEW COMPARISON:  12/26/2006 FINDINGS: Normal heart size. Left subclavian AICD device has been placed. Tips of the leads are in the right atrium and right ventricle. Lungs clear. No pneumothorax. No pleural effusion. IMPRESSION: No active cardiopulmonary disease. Electronically Signed   By: Marybelle Killings M.D.   On:  10/24/2018 13:28       Assessment & Plan:   Problem List Items Addressed This Visit    ANXIETY DEPRESSION    Discussed with her today.  Increased stress.  Sees psychiatry.  Discussed counseling.        Diarrhea, unspecified    Has had extensive GI w/up.  Bowels stable.        GERD (gastroesophageal reflux disease)    Upper symptoms controlled.        Hypertrophic obstructive cardiomyopathy (HCC)    S/p septal myomectomy.  Followed by Dr Mina Marble.  Recent admission for bacteremia.  ICD removed.  Scheduled to have replaced next week.  Follow.        Relevant Orders   Hepatic function panel   Lipid panel   Basic metabolic panel   Hypothyroidism    On thyroid replacement.  Follow tsh.       Iron deficiency anemia refractory to iron therapy    Follow cbc and ferritin.       Relevant Orders   CBC with Differential/Platelet   Ferritin   NSVT (nonsustained ventricular tachycardia) ([place])    Followed by cardiology.  Due to have defibrillator replaced next week.        Thrombocytopenia (HCC)    Recheck cbc.       Vitamin D deficiency    Follow vitamin D level.       Relevant Orders   VITAMIN D 25  Hydroxy (Vit-D Deficiency, Fractures)       Einar Pheasant, MD

## 2019-03-02 ENCOUNTER — Encounter: Payer: Self-pay | Admitting: Internal Medicine

## 2019-03-02 ENCOUNTER — Ambulatory Visit: Payer: Managed Care, Other (non HMO) | Admitting: Internal Medicine

## 2019-03-04 ENCOUNTER — Encounter: Payer: Self-pay | Admitting: Internal Medicine

## 2019-03-04 NOTE — Assessment & Plan Note (Signed)
S/p septal myomectomy.  Followed by Dr Mina Marble.  Recent admission for bacteremia.  ICD removed.  Scheduled to have replaced next week.  Follow.

## 2019-03-04 NOTE — Assessment & Plan Note (Signed)
Follow vitamin D level.  

## 2019-03-04 NOTE — Assessment & Plan Note (Signed)
On thyroid replacement.  Follow tsh.  

## 2019-03-04 NOTE — Assessment & Plan Note (Signed)
Has had extensive GI w/up.  Bowels stable.

## 2019-03-04 NOTE — Assessment & Plan Note (Signed)
Recheck cbc.  

## 2019-03-04 NOTE — Assessment & Plan Note (Signed)
Followed by cardiology.  Due to have defibrillator replaced next week.

## 2019-03-04 NOTE — Assessment & Plan Note (Signed)
Follow cbc and ferritin.  

## 2019-03-04 NOTE — Assessment & Plan Note (Signed)
Discussed with her today.  Increased stress.  Sees psychiatry.  Discussed counseling.

## 2019-03-04 NOTE — Assessment & Plan Note (Signed)
Upper symptoms controlled.  

## 2019-03-06 ENCOUNTER — Encounter: Payer: Self-pay | Admitting: Internal Medicine

## 2019-03-06 NOTE — Telephone Encounter (Signed)
Pt scheduled for the 29

## 2019-03-06 NOTE — Telephone Encounter (Signed)
Ok to schedule her an appt.  I can work her in.  Thanks

## 2019-03-14 LAB — CBC WITH DIFFERENTIAL/PLATELET
Basophils Absolute: 0.1 10*3/uL (ref 0.0–0.2)
Basos: 2 %
EOS (ABSOLUTE): 0.2 10*3/uL (ref 0.0–0.4)
Eos: 5 %
Hematocrit: 34 % (ref 34.0–46.6)
Hemoglobin: 10.7 g/dL — ABNORMAL LOW (ref 11.1–15.9)
Immature Grans (Abs): 0 10*3/uL (ref 0.0–0.1)
Immature Granulocytes: 0 %
Lymphocytes Absolute: 1.7 10*3/uL (ref 0.7–3.1)
Lymphs: 45 %
MCH: 24 pg — ABNORMAL LOW (ref 26.6–33.0)
MCHC: 31.5 g/dL (ref 31.5–35.7)
MCV: 76 fL — ABNORMAL LOW (ref 79–97)
Monocytes Absolute: 0.3 10*3/uL (ref 0.1–0.9)
Monocytes: 8 %
Neutrophils Absolute: 1.5 10*3/uL (ref 1.4–7.0)
Neutrophils: 40 %
Platelets: 153 10*3/uL (ref 150–450)
RBC: 4.46 x10E6/uL (ref 3.77–5.28)
RDW: 15.7 % — ABNORMAL HIGH (ref 11.7–15.4)
WBC: 3.7 10*3/uL (ref 3.4–10.8)

## 2019-03-14 LAB — BASIC METABOLIC PANEL
BUN/Creatinine Ratio: 21 (ref 9–23)
BUN: 18 mg/dL (ref 6–24)
CO2: 28 mmol/L (ref 20–29)
Calcium: 9.1 mg/dL (ref 8.7–10.2)
Chloride: 105 mmol/L (ref 96–106)
Creatinine, Ser: 0.85 mg/dL (ref 0.57–1.00)
GFR calc Af Amer: 96 mL/min/{1.73_m2} (ref >59)
GFR calc non Af Amer: 83 mL/min/{1.73_m2} (ref >59)
Glucose: 80 mg/dL (ref 65–99)
Potassium: 4 mmol/L (ref 3.5–5.2)
Sodium: 142 mmol/L (ref 134–144)

## 2019-03-14 LAB — HEPATIC FUNCTION PANEL
ALT: 23 IU/L (ref 0–32)
AST: 32 IU/L (ref 0–40)
Albumin: 3.7 g/dL — ABNORMAL LOW (ref 3.8–4.8)
Alkaline Phosphatase: 119 IU/L — ABNORMAL HIGH (ref 39–117)
Bilirubin Total: 0.4 mg/dL (ref 0.0–1.2)
Bilirubin, Direct: 0.1 mg/dL (ref 0.00–0.40)
Total Protein: 6.1 g/dL (ref 6.0–8.5)

## 2019-03-14 LAB — FERRITIN: Ferritin: 12 ng/mL — ABNORMAL LOW (ref 15–150)

## 2019-03-14 LAB — LIPID PANEL
Chol/HDL Ratio: 3 ratio (ref 0.0–4.4)
Cholesterol, Total: 177 mg/dL (ref 100–199)
HDL: 60 mg/dL (ref >39)
LDL Chol Calc (NIH): 107 mg/dL — ABNORMAL HIGH (ref 0–99)
Triglycerides: 53 mg/dL (ref 0–149)
VLDL Cholesterol Cal: 10 mg/dL (ref 5–40)

## 2019-03-14 LAB — VITAMIN D 25 HYDROXY (VIT D DEFICIENCY, FRACTURES): Vit D, 25-Hydroxy: 32.7 ng/mL (ref 30.0–100.0)

## 2019-03-17 ENCOUNTER — Encounter: Payer: Self-pay | Admitting: Internal Medicine

## 2019-03-21 ENCOUNTER — Other Ambulatory Visit: Payer: Self-pay

## 2019-03-21 ENCOUNTER — Ambulatory Visit (INDEPENDENT_AMBULATORY_CARE_PROVIDER_SITE_OTHER): Payer: Managed Care, Other (non HMO) | Admitting: Internal Medicine

## 2019-03-21 ENCOUNTER — Encounter: Payer: Self-pay | Admitting: Internal Medicine

## 2019-03-21 DIAGNOSIS — L659 Nonscarring hair loss, unspecified: Secondary | ICD-10-CM | POA: Diagnosis not present

## 2019-03-21 DIAGNOSIS — K219 Gastro-esophageal reflux disease without esophagitis: Secondary | ICD-10-CM

## 2019-03-21 DIAGNOSIS — I10 Essential (primary) hypertension: Secondary | ICD-10-CM

## 2019-03-21 DIAGNOSIS — F341 Dysthymic disorder: Secondary | ICD-10-CM | POA: Diagnosis not present

## 2019-03-21 DIAGNOSIS — D508 Other iron deficiency anemias: Secondary | ICD-10-CM

## 2019-03-21 DIAGNOSIS — I421 Obstructive hypertrophic cardiomyopathy: Secondary | ICD-10-CM

## 2019-03-21 DIAGNOSIS — E039 Hypothyroidism, unspecified: Secondary | ICD-10-CM

## 2019-03-21 NOTE — Progress Notes (Signed)
Patient ID: Carol Mahoney, female   DOB: 05-May-1976, 43 y.o.   MRN: [id number]   Subjective:    Patient ID: Carol Mahoney, female    DOB: [birth year], 43 y.o.   MRN: [id number]  HPI  Patient here for work in appt.  Is s/p defibrillator placement.  Here to have site evaluated.  Did well with the procedure.  No chest pain.  Breathing overall stable.  Trying not to take her diuretic.  Has not taken the last couple of weeks.  Eating a little better.  Bowels same.  No abdominal pain.  Still with increased stress.  Discussed with her today.  Seeing psychiatry.  Does not feel needs any further intervention.  Discussed recent labs.  hgb decreased.  Iron low.  Has required iron infusions previously.  Discussed referral back.  She is in agreement.  Also reports persistent hair loss.  Request to have thyroid checked.     Past Medical History:  Diagnosis Date  . Anemia   . Anxiety   . Depression   . GERD (gastroesophageal reflux disease)   . H/O hiatal hernia   . Hypertension   . Hypertr obst cardiomyop    mitral regurgitation, LAE - [facility] at [facility]  . Hypothyroidism   . Migraine headache   . PONV (postoperative nausea and vomiting)    Past Surgical History:  Procedure Laterality Date  . BUNIONECTOMY    . CHOLECYSTECTOMY    . HIATAL HERNIA REPAIR    . IMPLANTABLE CARDIOVERTER DEFIBRILLATOR REVISION  05/21/14   [facility]  . PARTIAL GASTRECTOMY    . TONSILLECTOMY     Family History  Problem Relation Age of Onset  . Anesthesia problems Father   . Hypertension Mother   . Hashimoto's thyroiditis Maternal Grandmother   . Hypertension Maternal Grandmother   . Breast cancer Neg Hx   . Colon cancer Neg Hx    Social History   Socioeconomic History  . Marital status: Single    Spouse name: Not on file  . Number of children: 1  . Years of education: Not on file  . Highest education level: Not on file  Occupational History  . Occupation: PROGRAM Printmaker: [employer]  .  Financial resource strain: Not on file  . Food insecurity    Worry: Not on file    Inability: Not on file  . Transportation needs    Medical: Not on file    Non-medical: Not on file  Tobacco Use  . Smoking status: Never Smoker  . Smokeless tobacco: Never Used  Substance and Sexual Activity  . Alcohol use: No    Alcohol/week: 0.0 standard drinks  . Drug use: No  . Sexual activity: Not Currently  Lifestyle  . Physical activity    Days per week: Not on file    Minutes per session: Not on file  . Stress: Not on file  Relationships  . Social Herbalist on phone: Not on file    Gets together: Not on file    Attends religious service: Not on file    Active member of club or organization: Not on file    Attends meetings of clubs or organizations: Not on file    Relationship status: Not on file  Other Topics Concern  . Not on file  Social History Narrative  . Not on file    Outpatient Encounter Medications as of 03/21/2019  Medication Sig  .  alosetron (LOTRONEX) 1 MG tablet Take 1 tablet (1 mg total) by mouth 2 (two) times daily.  Marland Kitchen ALPRAZolam (XANAX) 0.5 MG tablet 0.5 mg 3 (three) times daily as needed.   . clotrimazole (MYCELEX) 10 MG troche DISSOLVE 1 TROCHE SLOWLY AND COMPLETELY IN MOUTH 5 TIMES DAILY FOR 14 DAYS  . cyclobenzaprine (FLEXERIL) 5 MG tablet Take 1 tablet (5 mg total) by mouth at bedtime as needed for muscle spasms.  . diphenoxylate-atropine (LOMOTIL) 2.5-0.025 MG tablet Take 1 tablet by mouth 4 (four) times daily as needed for up to 30 days for diarrhea or loose stools. **PT NEEDS FOLLOW UP APPT**  . EPINEPHrine (EPIPEN 2-PAK) 0.3 mg/0.3 mL IJ SOAJ injection USE AS DIRECTED ON PACKAGE  . furosemide (LASIX) 20 MG tablet Take 20 mg by mouth daily as needed.   . ondansetron (ZOFRAN) 4 MG tablet Take 1 tablet (4 mg total) by mouth 2 (two) times daily as needed for nausea or vomiting.  . pantoprazole (PROTONIX) 40 MG tablet TAKE 1 TABLET BY MOUTH  DAILY  .  SUMAtriptan (IMITREX) 50 MG tablet Take 1 tablet by mouth  every 2 hours as needed for migraine(s)  . traZODone (DESYREL) 50 MG tablet 50 mg. Takes 1/2 tablet as needed at bedtime  . TRINTELLIX 20 MG TABS tablet TK 1 T PO QAM  . vitamin B-12 (CYANOCOBALAMIN) 1000 MCG tablet Take 1 tablet (1,000 mcg total) by mouth daily.  . [DISCONTINUED] acetaminophen (TYLENOL) 500 MG tablet Take 500 mg by mouth every 6 (six) hours as needed. For headache  . [DISCONTINUED] levothyroxine (SYNTHROID) 88 MCG tablet TAKE 1 TABLET(88 MCG) BY MOUTH DAILY  . [DISCONTINUED] mupirocin ointment (BACTROBAN) 2 % Apply to affected area bid.   No facility-administered encounter medications on file as of 03/21/2019.    Review of Systems  Constitutional: Negative for appetite change.       Eating better.    HENT: Negative for congestion and sinus pressure.   Respiratory: Negative for cough, chest tightness and shortness of breath.   Cardiovascular: Negative for chest pain, palpitations and leg swelling.  Gastrointestinal: Negative for abdominal pain, nausea and vomiting.       Bowels stable.    Endocrine:       Hair loss.    Genitourinary: Negative for difficulty urinating and dysuria.  Musculoskeletal: Negative for joint swelling and myalgias.  Skin: Negative for color change and rash.  Neurological: Negative for dizziness, light-headedness and headaches.  Psychiatric/Behavioral: Negative for agitation and dysphoric mood.       Increased stress as outlined.         Objective:    Physical Exam Constitutional:      General: She is not in acute distress.    Appearance: Normal appearance.  HENT:     Head: Normocephalic and atraumatic.     Right Ear: External ear normal.     Left Ear: External ear normal.  Eyes:     General: No scleral icterus.       Right eye: No discharge.        Left eye: No discharge.     Conjunctiva/sclera: Conjunctivae normal.  Neck:     Musculoskeletal: Neck supple. No muscular  tenderness.     Thyroid: No thyromegaly.  Cardiovascular:     Rate and Rhythm: Normal rate and regular rhythm.     Comments: 2/6 systolic murmur Pulmonary:     Effort: No respiratory distress.     Breath sounds: Normal breath sounds. No  wheezing.  Abdominal:     General: Bowel sounds are normal.     Palpations: Abdomen is soft.     Tenderness: There is no abdominal tenderness.  Musculoskeletal:        General: No tenderness.     Comments: No increased swelling.    Lymphadenopathy:     Cervical: No cervical adenopathy.  Skin:    Findings: No erythema or rash.     Comments: Incision site - no surrounding erythema.  No significant soreness.  Clean.  Steri strips in place.    Neurological:     Mental Status: She is alert.  Psychiatric:        Mood and Affect: Mood normal.        Behavior: Behavior normal.     BP 90/62   Pulse 74   Temp (!) 97.1 F (36.2 C)   Resp 16   Wt 134 lb (60.8 kg)   LMP 10/08/2011   SpO2 99%   BMI 20.99 kg/m  Wt Readings from Last 3 Encounters:  03/21/19 134 lb (60.8 kg)  02/28/19 134 lb (60.8 kg)  07/11/18 142 lb 12.8 oz (64.8 kg)     Lab Results  Component Value Date   WBC 3.7 03/13/2019   HGB 10.7 (L) 03/13/2019   HCT 34.0 03/13/2019   PLT 153 03/13/2019   GLUCOSE 80 03/13/2019   CHOL 177 03/13/2019   TRIG 53 03/13/2019   HDL 60 03/13/2019   LDLCALC 107 (H) 03/13/2019   ALT 23 03/13/2019   AST 32 03/13/2019   NA 142 03/13/2019   K 4.0 03/13/2019   CL 105 03/13/2019   CREATININE 0.85 03/13/2019   BUN 18 03/13/2019   CO2 28 03/13/2019   TSH 0.351 (L) 03/23/2019   INR 1.0 09/26/2014   HGBA1C 4.9 12/12/2014    Dg Chest 2 View  Result Date: 10/24/2018 CLINICAL DATA:  Fever and cough for 1 month EXAM: CHEST - 2 VIEW COMPARISON:  12/26/2006 FINDINGS: Normal heart size. Left subclavian AICD device has been placed. Tips of the leads are in the right atrium and right ventricle. Lungs clear. No pneumothorax. No pleural effusion.  IMPRESSION: No active cardiopulmonary disease. Electronically Signed   By: Marybelle Killings M.D.   On: 10/24/2018 13:28       Assessment & Plan:   Problem List Items Addressed This Visit    ANXIETY DEPRESSION    Followed by psychiatry. Discussed with her today.  Stable.       GERD (gastroesophageal reflux disease)    Doing well on current regimen.  Follow.       Hair loss    Persistent.  Check tsh.  Discussed dermatology referral.        Relevant Orders   TSH (Completed)   Hypertension    Blood pressure as outlined.  Low pressures.  Not taking lasix regularly.  Follow.        Hypertrophic obstructive cardiomyopathy (HCC)    S/p septal myomectomy.  Followed by Dr Mina Marble.  Recent admission for bacteremia.  ICD removed.  Just replaced.  Incision site looks good.  Continue f/u with cardiology.        Hypothyroidism    On thyroid replacement.  Check tsh.        Iron deficiency anemia refractory to iron therapy    CBC - hgb decreased.  Iron low.  Has seen hematology previously and received IV iron infusion. Refer back to hematology.  Not able to tolerate  oral iron.        Relevant Orders   Ambulatory referral to Hematology       Einar Pheasant, MD

## 2019-03-24 ENCOUNTER — Encounter: Payer: Self-pay | Admitting: Internal Medicine

## 2019-03-24 LAB — TSH: TSH: 0.351 u[IU]/mL — ABNORMAL LOW (ref 0.450–4.500)

## 2019-03-25 ENCOUNTER — Encounter: Payer: Self-pay | Admitting: Internal Medicine

## 2019-03-25 ENCOUNTER — Telehealth: Payer: Self-pay | Admitting: Internal Medicine

## 2019-03-25 DIAGNOSIS — E039 Hypothyroidism, unspecified: Secondary | ICD-10-CM

## 2019-03-25 MED ORDER — LEVOTHYROXINE SODIUM 75 MCG PO TABS: 75.0000 ug | ORAL_TABLET | Freq: Every day | ORAL | 3 refills | Status: DC

## 2019-03-25 NOTE — Assessment & Plan Note (Addendum)
CBC - hgb decreased.  Iron low.  Has seen hematology previously and received IV iron infusion. Refer back to hematology.  Not able to tolerate oral iron.

## 2019-03-25 NOTE — Telephone Encounter (Signed)
rx sent in for 86mcg synthroid to [facility].  Pt notified via my chart.

## 2019-03-25 NOTE — Assessment & Plan Note (Signed)
Blood pressure as outlined.  Low pressures.  Not taking lasix regularly.  Follow.

## 2019-03-25 NOTE — Assessment & Plan Note (Signed)
Doing well on current regimen.  Follow.   

## 2019-03-25 NOTE — Assessment & Plan Note (Signed)
Persistent.  Check tsh.  Discussed dermatology referral.

## 2019-03-25 NOTE — Telephone Encounter (Signed)
Order placed for f/u tsh lab at [facility]

## 2019-03-25 NOTE — Assessment & Plan Note (Signed)
Followed by psychiatry. Discussed with her today.  Stable.

## 2019-03-25 NOTE — Assessment & Plan Note (Signed)
S/p septal myomectomy.  Followed by Dr Mina Marble.  Recent admission for bacteremia.  ICD removed.  Just replaced.  Incision site looks good.  Continue f/u with cardiology.

## 2019-03-25 NOTE — Assessment & Plan Note (Signed)
On thyroid replacement.  Check tsh.  

## 2019-03-29 ENCOUNTER — Other Ambulatory Visit: Payer: Self-pay

## 2019-03-29 NOTE — Progress Notes (Signed)
Patient pre screened for office appointment, no questions or concerns today. 

## 2019-03-30 ENCOUNTER — Inpatient Hospital Stay: Payer: Managed Care, Other (non HMO) | Attending: Oncology | Admitting: Oncology

## 2019-03-30 ENCOUNTER — Encounter: Payer: Self-pay | Admitting: Oncology

## 2019-03-30 ENCOUNTER — Other Ambulatory Visit: Payer: Self-pay

## 2019-03-30 VITALS — BP 118/74 | HR 54 | Temp 96.9°F | Resp 18 | Wt 136.1 lb

## 2019-03-30 DIAGNOSIS — F419 Anxiety disorder, unspecified: Secondary | ICD-10-CM | POA: Insufficient documentation

## 2019-03-30 DIAGNOSIS — R531 Weakness: Secondary | ICD-10-CM | POA: Diagnosis not present

## 2019-03-30 DIAGNOSIS — Z8249 Family history of ischemic heart disease and other diseases of the circulatory system: Secondary | ICD-10-CM | POA: Insufficient documentation

## 2019-03-30 DIAGNOSIS — Z8349 Family history of other endocrine, nutritional and metabolic diseases: Secondary | ICD-10-CM | POA: Diagnosis not present

## 2019-03-30 DIAGNOSIS — Z79899 Other long term (current) drug therapy: Secondary | ICD-10-CM | POA: Diagnosis not present

## 2019-03-30 DIAGNOSIS — K219 Gastro-esophageal reflux disease without esophagitis: Secondary | ICD-10-CM | POA: Diagnosis not present

## 2019-03-30 DIAGNOSIS — D509 Iron deficiency anemia, unspecified: Secondary | ICD-10-CM | POA: Diagnosis present

## 2019-03-30 DIAGNOSIS — D508 Other iron deficiency anemias: Secondary | ICD-10-CM

## 2019-03-30 DIAGNOSIS — I10 Essential (primary) hypertension: Secondary | ICD-10-CM | POA: Diagnosis not present

## 2019-03-30 DIAGNOSIS — R5383 Other fatigue: Secondary | ICD-10-CM | POA: Diagnosis not present

## 2019-03-30 NOTE — Progress Notes (Signed)
[place]  [phone] [phone]  ID: Carol Mahoney OB: 08/22/1975  MR#: [id number]  [id number]  Patient Care Team: Einar Pheasant, MD as PCP - General (Unknown Physician Specialty)  CHIEF COMPLAINT:  Iron deficiency anemia refractory to oral iron therapy.  INTERVAL HISTORY: Patient last evaluated in clinic in January 2019.  She is referred back for increasing fatigue as well as a hemoglobin ferritin that are trending down.  She otherwise feels well.  She has no neurologic complaints. She denies any recent fevers or illnesses. She has a good appetite and denies weight loss.  She denies any chest pain, shortness of breath, cough, or hemoptysis.  She denies any nausea, vomiting, constipation, or diarrhea. She denies any melena or hematochezia. She has no urinary complaints.  Patient offers no further specific complaints today.  REVIEW OF SYSTEMS:   Review of Systems  Constitutional: Positive for malaise/fatigue. Negative for fever and weight loss.  Respiratory: Negative.  Negative for cough and shortness of breath.   Cardiovascular: Negative.  Negative for chest pain and leg swelling.  Gastrointestinal: Negative.  Negative for abdominal pain, blood in stool and melena.  Genitourinary: Negative.  Negative for hematuria.  Musculoskeletal: Negative.  Negative for back pain.  Skin: Negative.  Negative for rash.  Neurological: Positive for weakness. Negative for dizziness, focal weakness and headaches.  Psychiatric/Behavioral: Negative.  The patient is not nervous/anxious.    As per HPI. Otherwise, a complete review of systems is negative.  PAST MEDICAL HISTORY: Past Medical History:  Diagnosis Date  . Anemia   . Anxiety   . Depression   . GERD (gastroesophageal reflux disease)   . H/O hiatal hernia   . Hypertension   . Hypertr obst cardiomyop    mitral regurgitation, LAE - followed at [facility]  . Hypothyroidism   . Migraine headache   .  PONV (postoperative nausea and vomiting)     PAST SURGICAL HISTORY: Past Surgical History:  Procedure Laterality Date  . BUNIONECTOMY    . CHOLECYSTECTOMY    . HIATAL HERNIA REPAIR    . IMPLANTABLE CARDIOVERTER DEFIBRILLATOR REVISION  05/21/14   [facility]  . PARTIAL GASTRECTOMY    . TONSILLECTOMY      FAMILY HISTORY: Family History  Problem Relation Age of Onset  . Anesthesia problems Father   . Hypertension Mother   . Hashimoto's thyroiditis Maternal Grandmother   . Hypertension Maternal Grandmother   . Breast cancer Neg Hx   . Colon cancer Neg Hx     ADVANCED DIRECTIVES (Y/N):  N  HEALTH MAINTENANCE: Social History   Tobacco Use  . Smoking status: Never Smoker  . Smokeless tobacco: Never Used  Substance Use Topics  . Alcohol use: No    Alcohol/week: 0.0 standard drinks  . Drug use: No     Colonoscopy:  PAP:  Bone density:  Lipid panel:  Allergies  Allergen Reactions  . Carafate [Sucralfate] Rash and Hives  . Chloraprep One Step [Chlorhexidine Gluconate] Itching and Hives  . Shellfish-Derived Products Swelling  . Vancomycin Itching and Dermatitis  . Penicillins Hives and Rash  . Sulfa Antibiotics Hives and Rash  . Soap   . Trimethoprim     Other reaction(s): Unknown  . Iodine Rash    Current Outpatient Medications  Medication Sig Dispense Refill  . alosetron (LOTRONEX) 1 MG tablet Take 1 tablet (1 mg total) by mouth 2 (two) times daily. 180 tablet 3  . ALPRAZolam (XANAX) 0.5 MG tablet 0.5 mg  3 (three) times daily as needed.     . clotrimazole (MYCELEX) 10 MG troche DISSOLVE 1 TROCHE SLOWLY AND COMPLETELY IN MOUTH 5 TIMES DAILY FOR 14 DAYS    . cyclobenzaprine (FLEXERIL) 5 MG tablet Take 1 tablet (5 mg total) by mouth at bedtime as needed for muscle spasms. 30 tablet 0  . diphenoxylate-atropine (LOMOTIL) 2.5-0.025 MG tablet Take 1 tablet by mouth 4 (four) times daily as needed for up to 30 days for diarrhea or loose stools. **PT NEEDS FOLLOW UP APPT** 120  tablet 0  . EPINEPHrine (EPIPEN 2-PAK) 0.3 mg/0.3 mL IJ SOAJ injection USE AS DIRECTED ON PACKAGE 2 Device 1  . furosemide (LASIX) 20 MG tablet Take 20 mg by mouth daily as needed.     Marland Kitchen levothyroxine (SYNTHROID) 75 MCG tablet Take 1 tablet (75 mcg total) by mouth daily before breakfast. 30 tablet 3  . ondansetron (ZOFRAN) 4 MG tablet Take 1 tablet (4 mg total) by mouth 2 (two) times daily as needed for nausea or vomiting. 20 tablet 0  . pantoprazole (PROTONIX) 40 MG tablet TAKE 1 TABLET BY MOUTH  DAILY 90 tablet 3  . SUMAtriptan (IMITREX) 50 MG tablet Take 1 tablet by mouth  every 2 hours as needed for migraine(s) 54 tablet 2  . traZODone (DESYREL) 50 MG tablet 50 mg. Takes 1/2 tablet as needed at bedtime    . TRINTELLIX 20 MG TABS tablet TK 1 T PO QAM    . vitamin B-12 (CYANOCOBALAMIN) 1000 MCG tablet Take 1 tablet (1,000 mcg total) by mouth daily. 30 tablet 3   No current facility-administered medications for this visit.     OBJECTIVE: Vitals:   03/30/19 1332  BP: 118/74  Pulse: (!) 54  Resp: 18  Temp: (!) 96.9 F (36.1 C)  SpO2: 100%     Body mass index is 21.32 kg/m.    ECOG FS:0 - Asymptomatic  General: Well-developed, well-nourished, no acute distress. Eyes: Pink conjunctiva, anicteric sclera. HEENT: Normocephalic, moist mucous membranes. Lungs: Clear to auscultation bilaterally. Heart: Regular rate and rhythm. No rubs, murmurs, or gallops. Abdomen: Soft, nontender, nondistended. No organomegaly noted, normoactive bowel sounds. Musculoskeletal: No edema, cyanosis, or clubbing. Neuro: Alert, answering all questions appropriately. Cranial nerves grossly intact. Skin: No rashes or petechiae noted. Psych: Normal affect.  LAB RESULTS:  Lab Results  Component Value Date   NA 142 03/13/2019   K 4.0 03/13/2019   CL 105 03/13/2019   CO2 28 03/13/2019   GLUCOSE 80 03/13/2019   BUN 18 03/13/2019   CREATININE 0.85 03/13/2019   CALCIUM 9.1 03/13/2019   PROT 6.1 03/13/2019    ALBUMIN 3.7 (L) 03/13/2019   AST 32 03/13/2019   ALT 23 03/13/2019   ALKPHOS 119 (H) 03/13/2019   BILITOT 0.4 03/13/2019   GFRNONAA 83 03/13/2019   GFRAA 96 03/13/2019    Lab Results  Component Value Date   WBC 3.7 03/13/2019   NEUTROABS 1.5 03/13/2019   HGB 10.7 (L) 03/13/2019   HCT 34.0 03/13/2019   MCV 76 (L) 03/13/2019   PLT 153 03/13/2019   Lab Results  Component Value Date   IRON 121 04/03/2018   TIBC 334 04/03/2018   IRONPCTSAT 36 04/03/2018   Lab Results  Component Value Date   FERRITIN 12 (L) 03/13/2019     STUDIES: No results found.  ASSESSMENT: Iron deficiency anemia refractory to oral iron therapy  PLAN:   1.  Iron deficiency anemia refractory to oral iron therapy: Likely secondary  to poor absorption given her history of partial gastrectomy.  Patient's hemoglobin and ferritin have trended down and she is symptomatic.  Return to clinic in 1 and 2 weeks to receive 510 mg IV Feraheme.  Patient will then return to clinic in 3 months with repeat laboratory work, further evaluation, and consideration of additional treatment. .    I spent a total of 30 minutes face-to-face with the patient of which greater than 50% of the visit was spent in counseling and coordination of care as detailed above.    Patient expressed understanding and was in agreement with this plan. She also understands that She can call clinic at any time with any questions, concerns, or complaints.    Lloyd Huger, MD   03/30/2019 2:01 PM

## 2019-04-04 ENCOUNTER — Other Ambulatory Visit: Payer: Self-pay

## 2019-04-04 ENCOUNTER — Inpatient Hospital Stay: Payer: Managed Care, Other (non HMO)

## 2019-04-04 VITALS — BP 112/71 | HR 50 | Temp 98.0°F | Resp 17

## 2019-04-04 DIAGNOSIS — D508 Other iron deficiency anemias: Secondary | ICD-10-CM

## 2019-04-04 DIAGNOSIS — D509 Iron deficiency anemia, unspecified: Secondary | ICD-10-CM | POA: Diagnosis not present

## 2019-04-04 MED ORDER — SODIUM CHLORIDE 0.9 % IV SOLN
510.0000 mg | Freq: Once | INTRAVENOUS | Status: AC
  Administered 2019-04-04: 510 mg via INTRAVENOUS
  Filled 2019-04-04: qty 17

## 2019-04-04 MED ORDER — SODIUM CHLORIDE 0.9 % IV SOLN
Freq: Once | INTRAVENOUS | Status: AC
  Administered 2019-04-04: 14:00:00 via INTRAVENOUS
  Filled 2019-04-04: qty 250

## 2019-04-04 NOTE — Progress Notes (Signed)
Pt tolerated Feraheme infusion well with no signs of complications or reactions. VSS. Pt stable for discharge.   Marlisa Caridi [employer]

## 2019-04-10 ENCOUNTER — Other Ambulatory Visit: Payer: Self-pay

## 2019-04-11 ENCOUNTER — Inpatient Hospital Stay: Payer: Managed Care, Other (non HMO)

## 2019-04-11 ENCOUNTER — Other Ambulatory Visit: Payer: Self-pay

## 2019-04-11 VITALS — BP 105/67 | HR 64 | Temp 97.9°F | Resp 18

## 2019-04-11 DIAGNOSIS — D509 Iron deficiency anemia, unspecified: Secondary | ICD-10-CM | POA: Diagnosis not present

## 2019-04-11 DIAGNOSIS — D508 Other iron deficiency anemias: Secondary | ICD-10-CM

## 2019-04-11 MED ORDER — SODIUM CHLORIDE 0.9 % IV SOLN
Freq: Once | INTRAVENOUS | Status: AC
  Administered 2019-04-11: 14:00:00 via INTRAVENOUS
  Filled 2019-04-11: qty 250

## 2019-04-11 MED ORDER — SODIUM CHLORIDE 0.9 % IV SOLN
510.0000 mg | Freq: Once | INTRAVENOUS | Status: AC
  Administered 2019-04-11: 510 mg via INTRAVENOUS
  Filled 2019-04-11: qty 17

## 2019-05-02 ENCOUNTER — Ambulatory Visit
Admission: RE | Admit: 2019-05-02 | Discharge: 2019-05-02 | Disposition: A | Discharge status: Home / Self Care | Payer: Managed Care, Other (non HMO) | Source: Ambulatory Visit | Attending: Internal Medicine | Admitting: Internal Medicine

## 2019-05-02 DIAGNOSIS — Z1239 Encounter for other screening for malignant neoplasm of breast: Secondary | ICD-10-CM | POA: Diagnosis present

## 2019-05-02 DIAGNOSIS — Z1231 Encounter for screening mammogram for malignant neoplasm of breast: Secondary | ICD-10-CM | POA: Insufficient documentation

## 2019-05-25 ENCOUNTER — Other Ambulatory Visit: Payer: Self-pay | Admitting: Internal Medicine

## 2019-05-30 ENCOUNTER — Other Ambulatory Visit: Payer: Self-pay

## 2019-05-30 ENCOUNTER — Ambulatory Visit (INDEPENDENT_AMBULATORY_CARE_PROVIDER_SITE_OTHER): Payer: Managed Care, Other (non HMO) | Admitting: Internal Medicine

## 2019-05-30 ENCOUNTER — Encounter: Payer: Self-pay | Admitting: Internal Medicine

## 2019-05-30 ENCOUNTER — Telehealth: Payer: Self-pay | Admitting: Internal Medicine

## 2019-05-30 VITALS — BP 103/64 | Ht 67.0 in | Wt 135.8 lb

## 2019-05-30 DIAGNOSIS — D508 Other iron deficiency anemias: Secondary | ICD-10-CM

## 2019-05-30 DIAGNOSIS — I421 Obstructive hypertrophic cardiomyopathy: Secondary | ICD-10-CM

## 2019-05-30 DIAGNOSIS — I4729 Other ventricular tachycardia: Secondary | ICD-10-CM

## 2019-05-30 DIAGNOSIS — F341 Dysthymic disorder: Secondary | ICD-10-CM

## 2019-05-30 DIAGNOSIS — E039 Hypothyroidism, unspecified: Secondary | ICD-10-CM

## 2019-05-30 DIAGNOSIS — E538 Deficiency of other specified B group vitamins: Secondary | ICD-10-CM

## 2019-05-30 DIAGNOSIS — R5383 Other fatigue: Secondary | ICD-10-CM | POA: Diagnosis not present

## 2019-05-30 DIAGNOSIS — I10 Essential (primary) hypertension: Secondary | ICD-10-CM | POA: Diagnosis not present

## 2019-05-30 DIAGNOSIS — D696 Thrombocytopenia, unspecified: Secondary | ICD-10-CM

## 2019-05-30 DIAGNOSIS — K219 Gastro-esophageal reflux disease without esophagitis: Secondary | ICD-10-CM

## 2019-05-30 DIAGNOSIS — I472 Ventricular tachycardia: Secondary | ICD-10-CM

## 2019-05-30 NOTE — Assessment & Plan Note (Signed)
Seeing hematology.  Received iron infusions.  Recheck cbc and iron studies.

## 2019-05-30 NOTE — Progress Notes (Signed)
Patient ID: Carol Mahoney, female   DOB: July 30, 1975, 44 y.o.   MRN: [id number]   Virtual Visit via video Note  This visit type was conducted due to national recommendations for restrictions regarding the COVID-19 pandemic (e.g. social distancing).  This format is felt to be most appropriate for this patient at this time.  All issues noted in this document were discussed and addressed.  No physical exam was performed (except for noted visual exam findings with Video Visits).   I connected with Carol Mahoney by a video enabled telemedicine application and verified that I am speaking with the correct person using two identifiers. Location patient: home Location provider: work Persons participating in the virtual visit: patient, provider  I discussed the limitations, risks, security and privacy concerns of performing an evaluation and management service by video and the availability of in person appointments.  The patient expressed understanding and agreed to proceed.   Reason for visit: scheduled follow up.   HPI: She reports she is doing relatively well.  Still with increased stress - work, family.  Discussed with her today.  Seeing Dr Toy Care.  Discussed seeing a therapist.  She is in agreement.  Saw EP yesterday.  Described noticing an intermittent hard heart beat.  Data checked out ok.  Felt may be related to the auto test.  Is random.  No known trigger.  Breathing stable.  Eating.  Bowels stable.  No abdominal pain reported.  Seeing hematology and has received iron infusions recently.  Energy may have improved some - after second.  Discussed covid vaccine.     ROS: See pertinent positives and negatives per HPI.  Past Medical History:  Diagnosis Date  . Anemia   . Anxiety   . Depression   . GERD (gastroesophageal reflux disease)   . H/O hiatal hernia   . Hypertension   . Hypertr obst cardiomyop    mitral regurgitation, LAE - followed at [facility]  . Hypothyroidism   . Migraine headache   .  PONV (postoperative nausea and vomiting)     Past Surgical History:  Procedure Laterality Date  . BUNIONECTOMY    . CHOLECYSTECTOMY    . HIATAL HERNIA REPAIR    . IMPLANTABLE CARDIOVERTER DEFIBRILLATOR REVISION  05/21/14   [facility]  . PARTIAL GASTRECTOMY    . TONSILLECTOMY      Family History  Problem Relation Age of Onset  . Anesthesia problems Father   . Hypertension Mother   . Hashimoto's thyroiditis Maternal Grandmother   . Hypertension Maternal Grandmother   . Breast cancer Neg Hx   . Colon cancer Neg Hx     SOCIAL HX: reviewed.    Current Outpatient Medications:  .  alosetron (LOTRONEX) 1 MG tablet, Take 1 tablet (1 mg total) by mouth 2 (two) times daily., Disp: 180 tablet, Rfl: 3 .  ALPRAZolam (XANAX) 0.5 MG tablet, 0.5 mg 3 (three) times daily as needed. , Disp: , Rfl:  .  Cholecalciferol 25 MCG (1000 UT) capsule, Take 1,000 Units by mouth daily., Disp: , Rfl:  .  cyclobenzaprine (FLEXERIL) 5 MG tablet, Take 1 tablet (5 mg total) by mouth at bedtime as needed for muscle spasms., Disp: 30 tablet, Rfl: 0 .  diphenoxylate-atropine (LOMOTIL) 2.5-0.025 MG tablet, Take 1 tablet by mouth 4 (four) times daily as needed for up to 30 days for diarrhea or loose stools. **PT NEEDS FOLLOW UP APPT**, Disp: 120 tablet, Rfl: 0 .  Doxycycline Hyclate 50 MG TBEC, Take 1 tablet  by mouth 2 (two) times daily., Disp: , Rfl:  .  EPINEPHrine (EPIPEN 2-PAK) 0.3 mg/0.3 mL IJ SOAJ injection, USE AS DIRECTED ON PACKAGE, Disp: 2 Device, Rfl: 1 .  furosemide (LASIX) 20 MG tablet, Take 20 mg by mouth daily as needed. , Disp: , Rfl:  .  levothyroxine (SYNTHROID) 75 MCG tablet, Take 1 tablet (75 mcg total) by mouth daily before breakfast., Disp: 30 tablet, Rfl: 3 .  ondansetron (ZOFRAN) 4 MG tablet, Take 1 tablet (4 mg total) by mouth 2 (two) times daily as needed for nausea or vomiting., Disp: 20 tablet, Rfl: 0 .  pantoprazole (PROTONIX) 40 MG tablet, TAKE 1 TABLET BY MOUTH  DAILY, Disp: 90 tablet, Rfl:  3 .  SOOLANTRA 1 % CREA, Apply to face qhs, Disp: , Rfl:  .  SUMAtriptan (IMITREX) 50 MG tablet, Take 1 tablet by mouth  every 2 hours as needed for migraine(s), Disp: 54 tablet, Rfl: 2 .  traZODone (DESYREL) 50 MG tablet, 50 mg. Takes 1/2 tablet as needed at bedtime, Disp: , Rfl:  .  TRINTELLIX 20 MG TABS tablet, TK 1 T PO QAM, Disp: , Rfl:  .  vitamin B-12 (CYANOCOBALAMIN) 1000 MCG tablet, Take 1 tablet (1,000 mcg total) by mouth daily., Disp: 30 tablet, Rfl: 3  EXAM:  GENERAL: alert, oriented, appears well and in no acute distress  HEENT: atraumatic, conjunttiva clear, no obvious abnormalities on inspection of external nose and ears  NECK: normal movements of the head and neck  LUNGS: on inspection no signs of respiratory distress, breathing rate appears normal, no obvious gross SOB, gasping or wheezing  CV: no obvious cyanosis  PSYCH/NEURO: pleasant and cooperative, no obvious depression or anxiety, speech and thought processing grossly intact  ASSESSMENT AND PLAN:  Discussed the following assessment and plan:  ANXIETY DEPRESSION Discussed with her today.  Followed by Dr Toy Care. Discussed counseling.  Will forward names of counselors.    B12 deficiency Recheck B12 level with next labs.   GERD (gastroesophageal reflux disease) Controlled.    Hypertension Blood pressure has not been elevated recently.  Takes lasix prn.  Follow pressures.  Check metabolic panel.   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  Followed by Dr Mina Marble.  Recently had ICD removed and replaced.  Overall stable.  Noticed intermittent hard heart beat.  Reviewing of data - ok.  Follow.    Hypothyroidism On thyroid replacement.  Follow tsh.    Iron deficiency anemia refractory to iron therapy Seeing hematology.  Received iron infusions.  Recheck cbc and iron studies.    NSVT (nonsustained ventricular tachycardia) (HCC) Sees Dr Mina Marble.  Recent data review - ok.  Follow.     Thrombocytopenia ([place]) Platelet count 03/13/19 - wnl.  Follow.    Orders Placed This Encounter  Procedures  . CBC with Differential/Platelet  . Ferritin  . Iron and TIBC  . TSH  . Basic metabolic panel (today)     I discussed the assessment and treatment plan with the patient. The patient was provided an opportunity to ask questions and all were answered. The patient agreed with the plan and demonstrated an understanding of the instructions.   The patient was advised to call back or seek an in-person evaluation if the symptoms worsen or if the condition fails to improve as anticipated.   Einar Pheasant, MD

## 2019-05-30 NOTE — Assessment & Plan Note (Signed)
Recheck B12 level with next labs.  

## 2019-05-30 NOTE — Assessment & Plan Note (Signed)
S/p septal myomectomy.  Followed by Dr Mina Marble.  Recently had ICD removed and replaced.  Overall stable.  Noticed intermittent hard heart beat.  Reviewing of data - ok.  Follow.

## 2019-05-30 NOTE — Assessment & Plan Note (Signed)
Blood pressure has not been elevated recently.  Takes lasix prn.  Follow pressures.  Check metabolic panel.

## 2019-05-30 NOTE — Assessment & Plan Note (Signed)
Sees Dr Mina Marble.  Recent data review - ok.  Follow.

## 2019-05-30 NOTE — Assessment & Plan Note (Signed)
On thyroid replacement.  Follow tsh.  

## 2019-05-30 NOTE — Assessment & Plan Note (Signed)
Controlled.  

## 2019-05-30 NOTE — Assessment & Plan Note (Signed)
Platelet count 03/13/19 - wnl.  Follow.

## 2019-05-30 NOTE — Assessment & Plan Note (Signed)
Discussed with her today.  Followed by Dr Toy Care. Discussed counseling.  Will forward names of counselors.

## 2019-05-30 NOTE — Telephone Encounter (Signed)
Please send her names of counselors.  (names and phone numbers).  Thanks.

## 2019-05-31 NOTE — Telephone Encounter (Signed)
Mailed list to patient

## 2019-06-13 LAB — TSH: TSH: 3.44 u[IU]/mL (ref 0.450–4.500)

## 2019-06-13 LAB — CBC WITH DIFFERENTIAL/PLATELET
Basophils Absolute: 0.1 10*3/uL (ref 0.0–0.2)
Basos: 2 %
EOS (ABSOLUTE): 0.2 10*3/uL (ref 0.0–0.4)
Eos: 3 %
Hematocrit: 37.5 % (ref 34.0–46.6)
Hemoglobin: 13.2 g/dL (ref 11.1–15.9)
Immature Grans (Abs): 0 10*3/uL (ref 0.0–0.1)
Immature Granulocytes: 0 %
Lymphocytes Absolute: 1.8 10*3/uL (ref 0.7–3.1)
Lymphs: 36 %
MCH: 30 pg (ref 26.6–33.0)
MCHC: 35.2 g/dL (ref 31.5–35.7)
MCV: 85 fL (ref 79–97)
Monocytes Absolute: 0.3 10*3/uL (ref 0.1–0.9)
Monocytes: 6 %
Neutrophils Absolute: 2.5 10*3/uL (ref 1.4–7.0)
Neutrophils: 53 %
Platelets: 137 10*3/uL — ABNORMAL LOW (ref 150–450)
RBC: 4.4 x10E6/uL (ref 3.77–5.28)
RDW: 16.3 % — ABNORMAL HIGH (ref 11.7–15.4)
WBC: 4.9 10*3/uL (ref 3.4–10.8)

## 2019-06-13 LAB — IRON AND TIBC
Iron Saturation: 35 % (ref 15–55)
Iron: 85 ug/dL (ref 27–159)
Total Iron Binding Capacity: 244 ug/dL — ABNORMAL LOW (ref 250–450)
UIBC: 159 ug/dL (ref 131–425)

## 2019-06-13 LAB — BASIC METABOLIC PANEL
BUN/Creatinine Ratio: 17 (ref 9–23)
BUN: 15 mg/dL (ref 6–24)
CO2: 24 mmol/L (ref 20–29)
Calcium: 9.1 mg/dL (ref 8.7–10.2)
Chloride: 102 mmol/L (ref 96–106)
Creatinine, Ser: 0.87 mg/dL (ref 0.57–1.00)
GFR calc Af Amer: 92 mL/min/{1.73_m2} (ref >59)
GFR calc non Af Amer: 80 mL/min/{1.73_m2} (ref >59)
Glucose: 69 mg/dL (ref 65–99)
Potassium: 4.2 mmol/L (ref 3.5–5.2)
Sodium: 139 mmol/L (ref 134–144)

## 2019-06-13 LAB — FERRITIN: Ferritin: 127 ng/mL (ref 15–150)

## 2019-06-29 ENCOUNTER — Other Ambulatory Visit: Payer: Self-pay | Admitting: Emergency Medicine

## 2019-06-29 DIAGNOSIS — D508 Other iron deficiency anemias: Secondary | ICD-10-CM

## 2019-06-29 NOTE — Progress Notes (Deleted)
[place]  [phone]) [phone] [phone]) [phone]  ID: Carol Mahoney OB: Nov 02, 1975  MR#: [id number]  [id number]  Patient Care Team: Einar Pheasant, MD as PCP - General (Unknown Physician Specialty) Lloyd Huger, MD as Consulting Physician (Hematology and Oncology) Lloyd Huger, MD as Consulting Physician (Hematology and Oncology)  CHIEF COMPLAINT:  Iron deficiency anemia refractory to oral iron therapy.  INTERVAL HISTORY: Patient last evaluated in clinic in January 2019.  She is referred back for increasing fatigue as well as a hemoglobin ferritin that are trending down.  She otherwise feels well.  She has no neurologic complaints. She denies any recent fevers or illnesses. She has a good appetite and denies weight loss.  She denies any chest pain, shortness of breath, cough, or hemoptysis.  She denies any nausea, vomiting, constipation, or diarrhea. She denies any melena or hematochezia. She has no urinary complaints.  Patient offers no further specific complaints today.  REVIEW OF SYSTEMS:   Review of Systems  Constitutional: Positive for malaise/fatigue. Negative for fever and weight loss.  Respiratory: Negative.  Negative for cough and shortness of breath.   Cardiovascular: Negative.  Negative for chest pain and leg swelling.  Gastrointestinal: Negative.  Negative for abdominal pain, blood in stool and melena.  Genitourinary: Negative.  Negative for hematuria.  Musculoskeletal: Negative.  Negative for back pain.  Skin: Negative.  Negative for rash.  Neurological: Positive for weakness. Negative for dizziness, focal weakness and headaches.  Psychiatric/Behavioral: Negative.  The patient is not nervous/anxious.    As per HPI. Otherwise, a complete review of systems is negative.  PAST MEDICAL HISTORY: Past Medical History:  Diagnosis Date  . Anemia   . Anxiety   . Depression   . GERD (gastroesophageal reflux disease)   . H/O hiatal  hernia   . Hypertension   . Hypertr obst cardiomyop    mitral regurgitation, LAE - followed at [facility]  . Hypothyroidism   . Migraine headache   . PONV (postoperative nausea and vomiting)     PAST SURGICAL HISTORY: Past Surgical History:  Procedure Laterality Date  . BUNIONECTOMY    . CHOLECYSTECTOMY    . HIATAL HERNIA REPAIR    . IMPLANTABLE CARDIOVERTER DEFIBRILLATOR REVISION  05/21/14   [facility]  . PARTIAL GASTRECTOMY    . TONSILLECTOMY      FAMILY HISTORY: Family History  Problem Relation Age of Onset  . Anesthesia problems Father   . Hypertension Mother   . Hashimoto's thyroiditis Maternal Grandmother   . Hypertension Maternal Grandmother   . Breast cancer Neg Hx   . Colon cancer Neg Hx     ADVANCED DIRECTIVES (Y/N):  N  HEALTH MAINTENANCE: Social History   Tobacco Use  . Smoking status: Never Smoker  . Smokeless tobacco: Never Used  Substance Use Topics  . Alcohol use: No    Alcohol/week: 0.0 standard drinks  . Drug use: No     Colonoscopy:  PAP:  Bone density:  Lipid panel:  Allergies  Allergen Reactions  . Carafate [Sucralfate] Rash and Hives  . Chloraprep One Step [Chlorhexidine Gluconate] Itching and Hives  . Shellfish-Derived Products Swelling  . Vancomycin Itching and Dermatitis  . Penicillins Hives and Rash  . Sulfa Antibiotics Hives and Rash  . Soap   . Trimethoprim     Other reaction(s): Unknown  . Iodine Rash    Current Outpatient Medications  Medication Sig Dispense Refill  . alosetron (LOTRONEX) 1 MG tablet Take 1 tablet (1  mg total) by mouth 2 (two) times daily. 180 tablet 3  . ALPRAZolam (XANAX) 0.5 MG tablet 0.5 mg 3 (three) times daily as needed.     . Cholecalciferol 25 MCG (1000 UT) capsule Take 1,000 Units by mouth daily.    . cyclobenzaprine (FLEXERIL) 5 MG tablet Take 1 tablet (5 mg total) by mouth at bedtime as needed for muscle spasms. 30 tablet 0  . diphenoxylate-atropine (LOMOTIL) 2.5-0.025 MG tablet Take 1 tablet by mouth  4 (four) times daily as needed for up to 30 days for diarrhea or loose stools. **PT NEEDS FOLLOW UP APPT** 120 tablet 0  . Doxycycline Hyclate 50 MG TBEC Take 1 tablet by mouth 2 (two) times daily.    Marland Kitchen EPINEPHrine (EPIPEN 2-PAK) 0.3 mg/0.3 mL IJ SOAJ injection USE AS DIRECTED ON PACKAGE 2 Device 1  . furosemide (LASIX) 20 MG tablet Take 20 mg by mouth daily as needed.     Marland Kitchen levothyroxine (SYNTHROID) 75 MCG tablet Take 1 tablet (75 mcg total) by mouth daily before breakfast. 30 tablet 3  . ondansetron (ZOFRAN) 4 MG tablet Take 1 tablet (4 mg total) by mouth 2 (two) times daily as needed for nausea or vomiting. 20 tablet 0  . pantoprazole (PROTONIX) 40 MG tablet TAKE 1 TABLET BY MOUTH  DAILY 90 tablet 3  . SOOLANTRA 1 % CREA Apply to face qhs    . SUMAtriptan (IMITREX) 50 MG tablet Take 1 tablet by mouth  every 2 hours as needed for migraine(s) 54 tablet 2  . traZODone (DESYREL) 50 MG tablet 50 mg. Takes 1/2 tablet as needed at bedtime    . TRINTELLIX 20 MG TABS tablet TK 1 T PO QAM    . vitamin B-12 (CYANOCOBALAMIN) 1000 MCG tablet Take 1 tablet (1,000 mcg total) by mouth daily. 30 tablet 3   No current facility-administered medications for this visit.    OBJECTIVE: There were no vitals filed for this visit.   There is no height or weight on file to calculate BMI.    ECOG FS:0 - Asymptomatic  General: Well-developed, well-nourished, no acute distress. Eyes: Pink conjunctiva, anicteric sclera. HEENT: Normocephalic, moist mucous membranes. Lungs: Clear to auscultation bilaterally. Heart: Regular rate and rhythm. No rubs, murmurs, or gallops. Abdomen: Soft, nontender, nondistended. No organomegaly noted, normoactive bowel sounds. Musculoskeletal: No edema, cyanosis, or clubbing. Neuro: Alert, answering all questions appropriately. Cranial nerves grossly intact. Skin: No rashes or petechiae noted. Psych: Normal affect.  LAB RESULTS:  Lab Results  Component Value Date   NA 139 06/12/2019    K 4.2 06/12/2019   CL 102 06/12/2019   CO2 24 06/12/2019   GLUCOSE 69 06/12/2019   BUN 15 06/12/2019   CREATININE 0.87 06/12/2019   CALCIUM 9.1 06/12/2019   PROT 6.1 03/13/2019   ALBUMIN 3.7 (L) 03/13/2019   AST 32 03/13/2019   ALT 23 03/13/2019   ALKPHOS 119 (H) 03/13/2019   BILITOT 0.4 03/13/2019   GFRNONAA 80 06/12/2019   GFRAA 92 06/12/2019    Lab Results  Component Value Date   WBC 4.9 06/12/2019   NEUTROABS 2.5 06/12/2019   HGB 13.2 06/12/2019   HCT 37.5 06/12/2019   MCV 85 06/12/2019   PLT 137 (L) 06/12/2019   Lab Results  Component Value Date   IRON 85 06/12/2019   TIBC 244 (L) 06/12/2019   IRONPCTSAT 35 06/12/2019   Lab Results  Component Value Date   FERRITIN 127 06/12/2019     STUDIES: No results  found.  ASSESSMENT: Iron deficiency anemia refractory to oral iron therapy  PLAN:   1.  Iron deficiency anemia refractory to oral iron therapy: Likely secondary to poor absorption given her history of partial gastrectomy.  Patient's hemoglobin and ferritin have trended down and she is symptomatic.  Return to clinic in 1 and 2 weeks to receive 510 mg IV Feraheme.  Patient will then return to clinic in 3 months with repeat laboratory work, further evaluation, and consideration of additional treatment. .    I spent a total of 30 minutes face-to-face with the patient of which greater than 50% of the visit was spent in counseling and coordination of care as detailed above.    Patient expressed understanding and was in agreement with this plan. She also understands that She can call clinic at any time with any questions, concerns, or complaints.    Lloyd Huger, MD   06/29/2019 6:10 AM

## 2019-07-02 ENCOUNTER — Inpatient Hospital Stay: Payer: Managed Care, Other (non HMO)

## 2019-07-02 ENCOUNTER — Inpatient Hospital Stay: Payer: Managed Care, Other (non HMO) | Admitting: Oncology

## 2019-07-23 ENCOUNTER — Encounter: Payer: Self-pay | Admitting: Internal Medicine

## 2019-07-24 MED ORDER — LEVOTHYROXINE SODIUM 75 MCG PO TABS: 75.0000 ug | ORAL_TABLET | Freq: Every day | ORAL | 1 refills | Status: DC

## 2019-07-24 NOTE — Telephone Encounter (Signed)
Please send her the names of counselors.

## 2019-07-24 NOTE — Telephone Encounter (Signed)
Medication has been refilled.

## 2019-08-17 ENCOUNTER — Ambulatory Visit: Payer: Managed Care, Other (non HMO) | Attending: Internal Medicine

## 2019-08-17 DIAGNOSIS — Z23 Encounter for immunization: Secondary | ICD-10-CM

## 2019-08-17 NOTE — Progress Notes (Signed)
   Covid-19 Vaccination Clinic  Name:  [name]    MRN: [id number] DOB: [birth year]  08/17/2019  [name] was observed post Covid-19 immunization for 15 minutes without incident. She was provided with Vaccine Information Sheet and instruction to access the V-Safe system.   [name] was instructed to call 911 with any severe reactions post vaccine: Marland Kitchen Difficulty breathing  . Swelling of face and throat  . A fast heartbeat  . A bad rash all over body  . Dizziness and weakness   Immunizations Administered    Name Date Dose VIS Date Route   Pfizer COVID-19 Vaccine 08/17/2019  1:12 PM 0.3 mL 05/04/2019 Intramuscular   Manufacturer: Media   Lot: [id number]   [place]: [id number]

## 2019-08-31 ENCOUNTER — Other Ambulatory Visit: Payer: Self-pay

## 2019-08-31 NOTE — Progress Notes (Signed)
[place]  [phone] [phone]) [phone]  ID: Carol Mahoney OB: [birth year]  MR#: [id number]  [id number]  Patient Care Team: Einar Pheasant, MD as PCP - General (Unknown Physician Specialty) Lloyd Huger, MD as Consulting Physician (Hematology and Oncology) Lloyd Huger, MD as Consulting Physician (Hematology and Oncology)  CHIEF COMPLAINT:  Iron deficiency anemia refractory to oral iron therapy.  INTERVAL HISTORY: Patient returns to clinic today for repeat laboratory work and further evaluation.  She currently feels well and is asymptomatic.  She does not complain of any weakness or fatigue.  She has no neurologic complaints. She denies any recent fevers or illnesses. She has a good appetite and denies weight loss.  She denies any chest pain, shortness of breath, cough, or hemoptysis.  She denies any nausea, vomiting, constipation, or diarrhea. She denies any melena or hematochezia. She has no urinary complaints.  Patient offers no specific complaints today.  REVIEW OF SYSTEMS:   Review of Systems  Constitutional: Negative.  Negative for fever, malaise/fatigue and weight loss.  Respiratory: Negative.  Negative for cough and shortness of breath.   Cardiovascular: Negative.  Negative for chest pain and leg swelling.  Gastrointestinal: Negative.  Negative for abdominal pain, blood in stool and melena.  Genitourinary: Negative.  Negative for hematuria.  Musculoskeletal: Negative.  Negative for back pain.  Skin: Negative.  Negative for rash.  Neurological: Negative.  Negative for dizziness, focal weakness, weakness and headaches.  Psychiatric/Behavioral: Negative.  The patient is not nervous/anxious.    As per HPI. Otherwise, a complete review of systems is negative.  PAST MEDICAL HISTORY: Past Medical History:  Diagnosis Date  . Anemia   . Anxiety   . Depression   . GERD (gastroesophageal reflux disease)   . H/O hiatal hernia   .  Hypertension   . Hypertr obst cardiomyop    mitral regurgitation, LAE - followed at [facility]  . Hypothyroidism   . Migraine headache   . PONV (postoperative nausea and vomiting)     PAST SURGICAL HISTORY: Past Surgical History:  Procedure Laterality Date  . BUNIONECTOMY    . CHOLECYSTECTOMY    . HIATAL HERNIA REPAIR    . IMPLANTABLE CARDIOVERTER DEFIBRILLATOR REVISION  05/21/14   [facility]  . PARTIAL GASTRECTOMY    . TONSILLECTOMY      FAMILY HISTORY: Family History  Problem Relation Age of Onset  . Anesthesia problems Father   . Hypertension Mother   . Hashimoto's thyroiditis Maternal Grandmother   . Hypertension Maternal Grandmother   . Breast cancer Neg Hx   . Colon cancer Neg Hx     ADVANCED DIRECTIVES (Y/N):  N  HEALTH MAINTENANCE: Social History   Tobacco Use  . Smoking status: Never Smoker  . Smokeless tobacco: Never Used  Substance Use Topics  . Alcohol use: No    Alcohol/week: 0.0 standard drinks  . Drug use: No     Colonoscopy:  PAP:  Bone density:  Lipid panel:  Allergies  Allergen Reactions  . Carafate [Sucralfate] Rash and Hives  . Chloraprep One Step [Chlorhexidine Gluconate] Itching and Hives  . Shellfish-Derived Products Swelling    Other reaction(s): Angioedema Esophageal and lip swelling  . Vancomycin Itching and Dermatitis  . Penicillins Hives and Rash  . Sulfa Antibiotics Hives and Rash  . Soap   . Trimethoprim     Other reaction(s): Unknown  . Iodine Rash    Current Outpatient Medications  Medication Sig Dispense Refill  . alosetron (  LOTRONEX) 1 MG tablet Take 1 tablet (1 mg total) by mouth 2 (two) times daily. 180 tablet 3  . ALPRAZolam (XANAX) 0.5 MG tablet 0.5 mg 3 (three) times daily as needed.     . Cholecalciferol 25 MCG (1000 UT) capsule Take 1,000 Units by mouth daily.    . Ciclopirox 1 % shampoo Shampoo with  a small amount   as directed 3x/wk to daily. Massage into scalp, let sit a few minutes before rinsing.    .  clobetasol (TEMOVATE) 0.05 % external solution APPLY A SMALL AMOUNT TO THE AFFECTED AREA ONCE A DAY AS NEEDED. AVOID F/GIVE/A    . cyclobenzaprine (FLEXERIL) 5 MG tablet Take 1 tablet (5 mg total) by mouth at bedtime as needed for muscle spasms. 30 tablet 0  . diphenoxylate-atropine (LOMOTIL) 2.5-0.025 MG tablet Take 1 tablet by mouth 4 (four) times daily as needed for up to 30 days for diarrhea or loose stools. **PT NEEDS FOLLOW UP APPT** 120 tablet 0  . Doxycycline Hyclate 50 MG TBEC Take 1 tablet by mouth 2 (two) times daily.    Marland Kitchen EPINEPHrine (EPIPEN 2-PAK) 0.3 mg/0.3 mL IJ SOAJ injection USE AS DIRECTED ON PACKAGE 2 Device 1  . furosemide (LASIX) 20 MG tablet Take 20 mg by mouth daily as needed.     Marland Kitchen levothyroxine (SYNTHROID) 75 MCG tablet Take 1 tablet (75 mcg total) by mouth daily before breakfast. 90 tablet 1  . ondansetron (ZOFRAN) 4 MG tablet Take 1 tablet (4 mg total) by mouth 2 (two) times daily as needed for nausea or vomiting. 20 tablet 0  . pantoprazole (PROTONIX) 40 MG tablet TAKE 1 TABLET BY MOUTH  DAILY 90 tablet 3  . SOOLANTRA 1 % CREA Apply to face qhs    . SUMAtriptan (IMITREX) 50 MG tablet Take 1 tablet by mouth  every 2 hours as needed for migraine(s) 54 tablet 2  . tacrolimus (PROTOPIC) 0.1 % ointment     . traZODone (DESYREL) 50 MG tablet 50 mg. Takes 1/2 tablet as needed at bedtime    . TRINTELLIX 20 MG TABS tablet TK 1 T PO QAM    . vitamin B-12 (CYANOCOBALAMIN) 1000 MCG tablet Take 1 tablet (1,000 mcg total) by mouth daily. 30 tablet 3  . hydrocortisone (ANUSOL-HC) 25 MG suppository Place 1 suppository (25 mg total) rectally at bedtime. 12 suppository 0   No current facility-administered medications for this visit.    OBJECTIVE: Vitals:   09/03/19 1314  BP: 96/64  Pulse: (!) 55  Resp: 18  Temp: (!) 96.3 F (35.7 C)  SpO2: 100%     Body mass index is 22.3 kg/m.    ECOG FS:0 - Asymptomatic  General: Well-developed, well-nourished, no acute distress. Eyes:  Pink conjunctiva, anicteric sclera. HEENT: Normocephalic, moist mucous membranes. Lungs: No audible wheezing or coughing. Heart: Regular rate and rhythm. Abdomen: Soft, nontender, no obvious distention. Musculoskeletal: No edema, cyanosis, or clubbing. Neuro: Alert, answering all questions appropriately. Cranial nerves grossly intact. Skin: No rashes or petechiae noted. Psych: Normal affect.   LAB RESULTS:  Lab Results  Component Value Date   NA 139 06/12/2019   K 4.2 06/12/2019   CL 102 06/12/2019   CO2 24 06/12/2019   GLUCOSE 69 06/12/2019   BUN 15 06/12/2019   CREATININE 0.87 06/12/2019   CALCIUM 9.1 06/12/2019   PROT 6.1 03/13/2019   ALBUMIN 3.7 (L) 03/13/2019   AST 32 03/13/2019   ALT 23 03/13/2019   ALKPHOS 119 (H)  03/13/2019   BILITOT 0.4 03/13/2019   GFRNONAA 80 06/12/2019   GFRAA 92 06/12/2019    Lab Results  Component Value Date   WBC 3.8 (L) 09/03/2019   NEUTROABS 1.6 (L) 09/03/2019   HGB 13.0 09/03/2019   HCT 38.1 09/03/2019   MCV 88.8 09/03/2019   PLT 129 (L) 09/03/2019   Lab Results  Component Value Date   IRON 85 06/12/2019   TIBC 244 (L) 06/12/2019   IRONPCTSAT 35 06/12/2019   Lab Results  Component Value Date   FERRITIN 127 06/12/2019     STUDIES: No results found.  ASSESSMENT: Iron deficiency anemia refractory to oral iron therapy  PLAN:   1.  Iron deficiency anemia refractory to oral iron therapy: Likely secondary to poor absorption given her history of partial gastrectomy.  Resolved.  Patient's hemoglobin continues to be within normal limits.  Iron stores are pending at time of dictation, but were reported as normal in January 2021.  She does not require additional IV Feraheme today.  She last received treatment on April 11, 2019.  Return to clinic in 6 months with repeat laboratory work and video assisted telemedicine visit.    I spent a total of 20 minutes reviewing chart data, face-to-face evaluation with the patient, counseling  and coordination of care as detailed above.   Patient expressed understanding and was in agreement with this plan. She also understands that She can call clinic at any time with any questions, concerns, or complaints.    Lloyd Huger, MD   09/03/2019 2:33 PM

## 2019-08-31 NOTE — Progress Notes (Signed)
Pt reports that after her last Feraheme infusion, that night she had a lot of itching all over, with no associated rash. No other complaints.

## 2019-09-03 ENCOUNTER — Other Ambulatory Visit: Payer: Self-pay

## 2019-09-03 ENCOUNTER — Inpatient Hospital Stay: Payer: Managed Care, Other (non HMO) | Attending: Oncology | Admitting: Oncology

## 2019-09-03 ENCOUNTER — Inpatient Hospital Stay: Payer: Managed Care, Other (non HMO)

## 2019-09-03 ENCOUNTER — Encounter: Payer: Self-pay | Admitting: Oncology

## 2019-09-03 VITALS — BP 96/64 | HR 55 | Temp 96.3°F | Resp 18 | Wt 142.4 lb

## 2019-09-03 DIAGNOSIS — K219 Gastro-esophageal reflux disease without esophagitis: Secondary | ICD-10-CM | POA: Insufficient documentation

## 2019-09-03 DIAGNOSIS — Z888 Allergy status to other drugs, medicaments and biological substances status: Secondary | ICD-10-CM | POA: Insufficient documentation

## 2019-09-03 DIAGNOSIS — Z880 Allergy status to penicillin: Secondary | ICD-10-CM | POA: Insufficient documentation

## 2019-09-03 DIAGNOSIS — D508 Other iron deficiency anemias: Secondary | ICD-10-CM

## 2019-09-03 DIAGNOSIS — Z881 Allergy status to other antibiotic agents status: Secondary | ICD-10-CM | POA: Diagnosis not present

## 2019-09-03 DIAGNOSIS — Z8249 Family history of ischemic heart disease and other diseases of the circulatory system: Secondary | ICD-10-CM | POA: Insufficient documentation

## 2019-09-03 DIAGNOSIS — F419 Anxiety disorder, unspecified: Secondary | ICD-10-CM | POA: Diagnosis not present

## 2019-09-03 DIAGNOSIS — Z882 Allergy status to sulfonamides status: Secondary | ICD-10-CM | POA: Insufficient documentation

## 2019-09-03 DIAGNOSIS — D509 Iron deficiency anemia, unspecified: Secondary | ICD-10-CM | POA: Insufficient documentation

## 2019-09-03 DIAGNOSIS — F329 Major depressive disorder, single episode, unspecified: Secondary | ICD-10-CM | POA: Diagnosis not present

## 2019-09-03 DIAGNOSIS — E039 Hypothyroidism, unspecified: Secondary | ICD-10-CM | POA: Diagnosis not present

## 2019-09-03 DIAGNOSIS — Z79899 Other long term (current) drug therapy: Secondary | ICD-10-CM | POA: Insufficient documentation

## 2019-09-03 DIAGNOSIS — I10 Essential (primary) hypertension: Secondary | ICD-10-CM | POA: Insufficient documentation

## 2019-09-03 DIAGNOSIS — I340 Nonrheumatic mitral (valve) insufficiency: Secondary | ICD-10-CM | POA: Diagnosis not present

## 2019-09-03 LAB — CBC WITH DIFFERENTIAL/PLATELET
Abs Immature Granulocytes: 0.01 10*3/uL (ref 0.00–0.07)
Basophils Absolute: 0.1 10*3/uL (ref 0.0–0.1)
Basophils Relative: 2 %
Eosinophils Absolute: 0.1 10*3/uL (ref 0.0–0.5)
Eosinophils Relative: 3 %
HCT: 38.1 % (ref 36.0–46.0)
Hemoglobin: 13 g/dL (ref 12.0–15.0)
Immature Granulocytes: 0 %
Lymphocytes Relative: 46 %
Lymphs Abs: 1.7 10*3/uL (ref 0.7–4.0)
MCH: 30.3 pg (ref 26.0–34.0)
MCHC: 34.1 g/dL (ref 30.0–36.0)
MCV: 88.8 fL (ref 80.0–100.0)
Monocytes Absolute: 0.3 10*3/uL (ref 0.1–1.0)
Monocytes Relative: 7 %
Neutro Abs: 1.6 10*3/uL — ABNORMAL LOW (ref 1.7–7.7)
Neutrophils Relative %: 42 %
Platelets: 129 10*3/uL — ABNORMAL LOW (ref 150–400)
RBC: 4.29 MIL/uL (ref 3.87–5.11)
RDW: 13 % (ref 11.5–15.5)
WBC: 3.8 10*3/uL — ABNORMAL LOW (ref 4.0–10.5)
nRBC: 0 % (ref 0.0–0.2)

## 2019-09-03 LAB — IRON AND TIBC
Iron: 99 ug/dL (ref 28–170)
Saturation Ratios: 32 % — ABNORMAL HIGH (ref 10.4–31.8)
TIBC: 309 ug/dL (ref 250–450)
UIBC: 210 ug/dL

## 2019-09-03 LAB — FERRITIN: Ferritin: 50 ng/mL (ref 11–307)

## 2019-09-03 MED ORDER — HYDROCORTISONE ACETATE 25 MG RE SUPP: 25.0000 mg | Freq: Every day | RECTAL | 0 refills | Status: DC

## 2019-09-05 ENCOUNTER — Encounter: Payer: Self-pay | Admitting: Internal Medicine

## 2019-09-05 ENCOUNTER — Other Ambulatory Visit: Payer: Self-pay

## 2019-09-05 ENCOUNTER — Ambulatory Visit: Payer: Managed Care, Other (non HMO) | Admitting: Internal Medicine

## 2019-09-05 VITALS — BP 100/64 | HR 67 | Temp 97.6°F | Resp 16 | Ht 67.0 in | Wt 142.6 lb

## 2019-09-05 DIAGNOSIS — R0602 Shortness of breath: Secondary | ICD-10-CM

## 2019-09-05 DIAGNOSIS — K219 Gastro-esophageal reflux disease without esophagitis: Secondary | ICD-10-CM

## 2019-09-05 DIAGNOSIS — Z95810 Presence of automatic (implantable) cardiac defibrillator: Secondary | ICD-10-CM

## 2019-09-05 DIAGNOSIS — E039 Hypothyroidism, unspecified: Secondary | ICD-10-CM

## 2019-09-05 DIAGNOSIS — E538 Deficiency of other specified B group vitamins: Secondary | ICD-10-CM

## 2019-09-05 DIAGNOSIS — I10 Essential (primary) hypertension: Secondary | ICD-10-CM

## 2019-09-05 DIAGNOSIS — D508 Other iron deficiency anemias: Secondary | ICD-10-CM

## 2019-09-05 DIAGNOSIS — R197 Diarrhea, unspecified: Secondary | ICD-10-CM | POA: Diagnosis not present

## 2019-09-05 DIAGNOSIS — I421 Obstructive hypertrophic cardiomyopathy: Secondary | ICD-10-CM

## 2019-09-05 DIAGNOSIS — F341 Dysthymic disorder: Secondary | ICD-10-CM | POA: Diagnosis not present

## 2019-09-05 DIAGNOSIS — D696 Thrombocytopenia, unspecified: Secondary | ICD-10-CM

## 2019-09-05 NOTE — Progress Notes (Addendum)
Patient ID: Carol Mahoney, female   DOB: 1975/10/12, 44 y.o.   MRN: [id number]   Subjective:    Patient ID: Carol Mahoney, female    DOB: 1975/05/26, 44 y.o.   MRN: [id number]  HPI This visit occurred during the SARS-CoV-2 public health emergency.  Safety protocols were in place, including screening questions prior to the visit, additional usage of staff PPE, and extensive cleaning of exam room while observing appropriate contact time as indicated for disinfecting solutions.  Patient here for a scheduled follow up.  She reports things are relatively stable.  Last week noticed more sob and more swelling.  Better this week.  Still with increased stress.  Discussed.  Seeing psychiatry.  Eating.  No chest pain.  No increased cough or congestion.  No acid reflux.  Bowels relatively stable.  Seeing Dr Grayland Ormond for her anemia.  Last checked 4/12//21.  Recommended f/u in 6 months.    Past Medical History:  Diagnosis Date  . Anemia   . Anxiety   . Depression   . GERD (gastroesophageal reflux disease)   . H/O hiatal hernia   . Hypertension   . Hypertr obst cardiomyop    mitral regurgitation, LAE - followed at [facility]  . Hypothyroidism   . Migraine headache   . PONV (postoperative nausea and vomiting)    Past Surgical History:  Procedure Laterality Date  . BUNIONECTOMY    . CHOLECYSTECTOMY    . HIATAL HERNIA REPAIR    . IMPLANTABLE CARDIOVERTER DEFIBRILLATOR REVISION  05/21/14   [facility]  . PARTIAL GASTRECTOMY    . TONSILLECTOMY     Family History  Problem Relation Age of Onset  . Anesthesia problems Father   . Hypertension Mother   . Hashimoto's thyroiditis Maternal Grandmother   . Hypertension Maternal Grandmother   . Breast cancer Neg Hx   . Colon cancer Neg Hx    Social History   Socioeconomic History  . Marital status: Single    Spouse name: Not on file  . Number of children: 1  . Years of education: Not on file  . Highest education level: Not on file  Occupational History  .  Occupation: PROGRAM Printmaker: [employer]  Tobacco Use  . Smoking status: Never Smoker  . Smokeless tobacco: Never Used  Substance and Sexual Activity  . Alcohol use: No    Alcohol/week: 0.0 standard drinks  . Drug use: No  . Sexual activity: Not Currently  Other Topics Concern  . Not on file  Social History Narrative  . Not on file   Social Determinants of Health   Financial Resource Strain:   . Difficulty of Paying Living Expenses:   Food Insecurity:   . Worried About Charity fundraiser in the Last Year:   . Arboriculturist in the Last Year:   Transportation Needs:   . Film/video editor (Medical):   Marland Kitchen Lack of Transportation (Non-Medical):   Physical Activity:   . Days of Exercise per Week:   . Minutes of Exercise per Session:   Stress:   . Feeling of Stress :   Social Connections:   . Frequency of Communication with Friends and Family:   . Frequency of Social Gatherings with Friends and Family:   . Attends Religious Services:   . Active Member of Clubs or Organizations:   . Attends Archivist Meetings:   Marland Kitchen Marital Status:     Outpatient Encounter Medications as  of 09/05/2019  Medication Sig  . alosetron (LOTRONEX) 1 MG tablet Take 1 tablet (1 mg total) by mouth 2 (two) times daily.  Marland Kitchen ALPRAZolam (XANAX) 0.5 MG tablet 0.5 mg 3 (three) times daily as needed.   . Cholecalciferol 25 MCG (1000 UT) capsule Take 1,000 Units by mouth daily.  . Ciclopirox 1 % shampoo Shampoo with  a small amount   as directed 3x/wk to daily. Massage into scalp, let sit a few minutes before rinsing.  . clobetasol (TEMOVATE) 0.05 % external solution APPLY A SMALL AMOUNT TO THE AFFECTED AREA ONCE A DAY AS NEEDED. AVOID F/GIVE/A  . cyclobenzaprine (FLEXERIL) 5 MG tablet Take 1 tablet (5 mg total) by mouth at bedtime as needed for muscle spasms.  . diphenoxylate-atropine (LOMOTIL) 2.5-0.025 MG tablet Take 1 tablet by mouth 4 (four) times daily as needed for up to 30 days for  diarrhea or loose stools. **PT NEEDS FOLLOW UP APPT**  . Doxycycline Hyclate 50 MG TBEC Take 1 tablet by mouth 2 (two) times daily.  Marland Kitchen EPINEPHrine (EPIPEN 2-PAK) 0.3 mg/0.3 mL IJ SOAJ injection USE AS DIRECTED ON PACKAGE  . furosemide (LASIX) 20 MG tablet Take 20 mg by mouth daily as needed.   . hydrocortisone (ANUSOL-HC) 25 MG suppository Place 1 suppository (25 mg total) rectally at bedtime.  . ondansetron (ZOFRAN) 4 MG tablet Take 1 tablet (4 mg total) by mouth 2 (two) times daily as needed for nausea or vomiting.  . pantoprazole (PROTONIX) 40 MG tablet TAKE 1 TABLET BY MOUTH  DAILY  . SOOLANTRA 1 % CREA Apply to face qhs  . SUMAtriptan (IMITREX) 50 MG tablet Take 1 tablet by mouth  every 2 hours as needed for migraine(s)  . tacrolimus (PROTOPIC) 0.1 % ointment   . traZODone (DESYREL) 50 MG tablet 50 mg. Takes 1/2 tablet as needed at bedtime  . TRINTELLIX 20 MG TABS tablet TK 1 T PO QAM  . vitamin B-12 (CYANOCOBALAMIN) 1000 MCG tablet Take 1 tablet (1,000 mcg total) by mouth daily.  . [DISCONTINUED] levothyroxine (SYNTHROID) 75 MCG tablet Take 1 tablet (75 mcg total) by mouth daily before breakfast.   No facility-administered encounter medications on file as of 09/05/2019.   Review of Systems  Constitutional: Negative for appetite change and unexpected weight change.  HENT: Negative for congestion and sinus pressure.   Respiratory: Negative for cough and chest tightness.   Cardiovascular: Negative for chest pain and palpitations.  Gastrointestinal: Negative.  Negative for abdominal pain and nausea.  Genitourinary: Negative for difficulty urinating and dysuria.  Musculoskeletal: Negative for gait problem and joint swelling.  Skin: Negative for color change and rash.  Neurological: Negative for dizziness, light-headedness and headaches.  Psychiatric/Behavioral:       Increased stress as outlined.         Objective:    Physical Exam Constitutional:      General: She is not in acute  distress.    Appearance: Normal appearance. She is well-developed.  HENT:     Head: Normocephalic and atraumatic.  Eyes:     General: No scleral icterus.       Right eye: No discharge.        Left eye: No discharge.     Conjunctiva/sclera: Conjunctivae normal.  Neck:     Thyroid: No thyromegaly.  Cardiovascular:     Rate and Rhythm: Normal rate and regular rhythm.  Pulmonary:     Effort: No tachypnea, accessory muscle usage or respiratory distress.  Breath sounds: Normal breath sounds. No decreased breath sounds, wheezing or rhonchi.  Chest:     Breasts:        Right: No inverted nipple, mass, nipple discharge or tenderness (no axillary adenopathy).        Left: No inverted nipple, mass, nipple discharge or tenderness (no axilarry adenopathy).  Abdominal:     General: Bowel sounds are normal.     Palpations: Abdomen is soft.     Tenderness: There is no abdominal tenderness.  Musculoskeletal:        General: No swelling or tenderness.     Cervical back: Neck supple. No tenderness.  Lymphadenopathy:     Cervical: No cervical adenopathy.  Skin:    General: Skin is warm.     Findings: No rash.  Neurological:     Mental Status: She is alert and oriented to person, place, and time.  Psychiatric:        Behavior: Behavior normal.     BP 100/64   Pulse 67   Temp 97.6 F (36.4 C)   Resp 16   Ht 5\' 7"  (1.702 m)   Wt 142 lb 9.6 oz (64.7 kg)   LMP 10/08/2011   SpO2 99%   BMI 22.33 kg/m  Wt Readings from Last 3 Encounters:  09/05/19 142 lb 9.6 oz (64.7 kg)  09/03/19 142 lb 6.4 oz (64.6 kg)  05/30/19 135 lb 12.8 oz (61.6 kg)     Lab Results  Component Value Date   WBC 3.8 (L) 09/03/2019   HGB 13.0 09/03/2019   HCT 38.1 09/03/2019   PLT 129 (L) 09/03/2019   GLUCOSE 72 09/12/2019   CHOL 177 03/13/2019   TRIG 53 03/13/2019   HDL 60 03/13/2019   LDLCALC 107 (H) 03/13/2019   ALT 45 (H) 09/12/2019   AST 35 09/12/2019   NA 142 09/12/2019   K 4.3 09/12/2019   CL  102 09/12/2019   CREATININE 0.86 09/12/2019   BUN 11 09/12/2019   CO2 25 09/12/2019   TSH 8.220 (H) 09/12/2019   INR 1.0 09/26/2014   HGBA1C 4.9 12/12/2014    MM 3D SCREEN BREAST BILATERAL  Result Date: 05/02/2019 CLINICAL DATA:  Screening. EXAM: DIGITAL SCREENING BILATERAL MAMMOGRAM WITH TOMO AND CAD COMPARISON:  Previous exam(s). ACR Breast Density Category d: The breast tissue is extremely dense, which lowers the sensitivity of mammography FINDINGS: There are no findings suspicious for malignancy. Images were processed with CAD. IMPRESSION: No mammographic evidence of malignancy. A result letter of this screening mammogram will be mailed directly to the patient. RECOMMENDATION: Screening mammogram in one year. (Code:SM-B-01Y) BI-RADS CATEGORY  1: Negative. Electronically Signed   By: Margarette Canada M.D.   On: 05/02/2019 08:14       Assessment & Plan:   Problem List Items Addressed This Visit    ANXIETY DEPRESSION    Followed by Dr Toy Care.  Discussed.  Overall stable.  Follow.        B12 deficiency - Primary    Recheck B12 level.       Relevant Orders   Vitamin B12 (Completed)   Diarrhea, unspecified    Has had extensive Gi w/pu.  Stabe.  Follow.       GERD (gastroesophageal reflux disease)    Controlled on protonix.  Follow.       Hypertension    Blood pressure as outlined.  Overall stable.  Continue f/u with cardiology.       Hypertrophic obstructive cardiomyopathy ([place])  S/p septal myomectomy.  Followed by Dr Mina Marble.  Recently had ICD removed and replaced.  Stable.  Follow.        Hypothyroidism    On thyroid placement.  Follow tsh.       ICD (implantable cardioverter-defibrillator) in place    Followed by Dr Mina Marble.       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  Last check 09/03/19.  Stable.  Recommended f/u in 6 months.        SOB (shortness of breath)    Noticed increased sob last week.  Trying not to take her fluid pill due to  low blood pressure. Appears to be doing better now.  Will check routine labs.  Discussed f/u with Dr Mina Marble.  Feels things are stable overall.  Follow.        Relevant Orders   Comprehensive metabolic panel (Completed)   TSH (Completed)   Thrombocytopenia (HCC)    Recheck cbc.           Einar Pheasant, MD

## 2019-09-11 ENCOUNTER — Ambulatory Visit: Payer: Managed Care, Other (non HMO) | Attending: Internal Medicine

## 2019-09-11 DIAGNOSIS — Z23 Encounter for immunization: Secondary | ICD-10-CM

## 2019-09-11 NOTE — Progress Notes (Signed)
   Covid-19 Vaccination Clinic  Name:  Carol Mahoney    MRN: [id number] DOB: [birth year]  09/11/2019  Carol Mahoney was observed post Covid-19 immunization for 15 minutes without incident. She was provided with Vaccine Information Sheet and instruction to access the V-Safe system.   Carol Mahoney was instructed to call 911 with any severe reactions post vaccine: Marland Kitchen Difficulty breathing  . Swelling of face and throat  . A fast heartbeat  . A bad rash all over body  . Dizziness and weakness   Immunizations Administered    Name Date Dose VIS Date Route   Pfizer COVID-19 Vaccine 09/11/2019 12:38 PM 0.3 mL 07/18/2018 Intramuscular   Manufacturer: [place]   Lot: [id number]   [place]: [id number]

## 2019-09-13 ENCOUNTER — Telehealth: Payer: Self-pay | Admitting: Internal Medicine

## 2019-09-13 DIAGNOSIS — E039 Hypothyroidism, unspecified: Secondary | ICD-10-CM

## 2019-09-13 DIAGNOSIS — R945 Abnormal results of liver function studies: Secondary | ICD-10-CM

## 2019-09-13 DIAGNOSIS — R7989 Other specified abnormal findings of blood chemistry: Secondary | ICD-10-CM

## 2019-09-13 LAB — COMPREHENSIVE METABOLIC PANEL
ALT: 45 IU/L — ABNORMAL HIGH (ref 0–32)
AST: 35 IU/L (ref 0–40)
Albumin/Globulin Ratio: 1.7 (ref 1.2–2.2)
Albumin: 4.2 g/dL (ref 3.8–4.8)
Alkaline Phosphatase: 96 IU/L (ref 39–117)
BUN/Creatinine Ratio: 13 (ref 9–23)
BUN: 11 mg/dL (ref 6–24)
Bilirubin Total: 0.7 mg/dL (ref 0.0–1.2)
CO2: 25 mmol/L (ref 20–29)
Calcium: 9.6 mg/dL (ref 8.7–10.2)
Chloride: 102 mmol/L (ref 96–106)
Creatinine, Ser: 0.86 mg/dL (ref 0.57–1.00)
GFR calc Af Amer: 94 mL/min/{1.73_m2} (ref >59)
GFR calc non Af Amer: 81 mL/min/{1.73_m2} (ref >59)
Globulin, Total: 2.5 g/dL (ref 1.5–4.5)
Glucose: 72 mg/dL (ref 65–99)
Potassium: 4.3 mmol/L (ref 3.5–5.2)
Sodium: 142 mmol/L (ref 134–144)
Total Protein: 6.7 g/dL (ref 6.0–8.5)

## 2019-09-13 LAB — TSH: TSH: 8.22 u[IU]/mL — ABNORMAL HIGH (ref 0.450–4.500)

## 2019-09-13 LAB — VITAMIN B12: Vitamin B-12: 1996 pg/mL — ABNORMAL HIGH (ref 232–1245)

## 2019-09-13 NOTE — Telephone Encounter (Signed)
Order placed for f/u labs.  

## 2019-09-14 ENCOUNTER — Telehealth: Payer: Self-pay

## 2019-09-14 ENCOUNTER — Other Ambulatory Visit: Payer: Self-pay

## 2019-09-14 DIAGNOSIS — E039 Hypothyroidism, unspecified: Secondary | ICD-10-CM

## 2019-09-14 DIAGNOSIS — I10 Essential (primary) hypertension: Secondary | ICD-10-CM

## 2019-09-14 MED ORDER — LEVOTHYROXINE SODIUM 88 MCG PO TABS: 75.0000 ug | ORAL_TABLET | Freq: Every day | ORAL | 0 refills | Status: DC

## 2019-09-14 NOTE — Telephone Encounter (Signed)
Orders placed for [facility].   

## 2019-09-15 NOTE — Assessment & Plan Note (Signed)
Controlled on protonix.  Follow.   

## 2019-09-15 NOTE — Assessment & Plan Note (Signed)
On thyroid placement.  Follow tsh.   

## 2019-09-15 NOTE — Assessment & Plan Note (Signed)
S/p septal myomectomy.  Followed by Dr Mina Marble.  Recently had ICD removed and replaced.  Stable.  Follow.

## 2019-09-15 NOTE — Assessment & Plan Note (Signed)
Has had extensive Gi w/pu.  Stabe.  Follow.

## 2019-09-15 NOTE — Assessment & Plan Note (Signed)
Followed by Dr Toy Care.  Discussed.  Overall stable.  Follow.

## 2019-09-15 NOTE — Assessment & Plan Note (Signed)
Recheck cbc.  

## 2019-09-15 NOTE — Assessment & Plan Note (Signed)
Recheck B12 level 

## 2019-09-15 NOTE — Assessment & Plan Note (Signed)
Blood pressure as outlined.  Overall stable.  Continue f/u with cardiology.

## 2019-09-15 NOTE — Assessment & Plan Note (Signed)
Seeing hematology.  Last check 09/03/19.  Stable.  Recommended f/u in 6 months.

## 2019-09-15 NOTE — Assessment & Plan Note (Signed)
Followed by Dr Wang.  

## 2019-09-16 NOTE — Assessment & Plan Note (Signed)
Noticed increased sob last week.  Trying not to take her fluid pill due to low blood pressure. Appears to be doing better now.  Will check routine labs.  Discussed f/u with Dr Mina Marble.  Feels things are stable overall.  Follow.

## 2019-10-04 LAB — HEPATIC FUNCTION PANEL
ALT: 51 IU/L — ABNORMAL HIGH (ref 0–32)
AST: 45 IU/L — ABNORMAL HIGH (ref 0–40)
Albumin: 4.2 g/dL (ref 3.8–4.8)
Alkaline Phosphatase: 96 IU/L (ref 39–117)
Bilirubin Total: 0.7 mg/dL (ref 0.0–1.2)
Bilirubin, Direct: 0.19 mg/dL (ref 0.00–0.40)
Total Protein: 6.5 g/dL (ref 6.0–8.5)

## 2019-10-04 LAB — TSH: TSH: 1.6 u[IU]/mL (ref 0.450–4.500)

## 2019-10-06 ENCOUNTER — Other Ambulatory Visit: Payer: Self-pay | Admitting: Internal Medicine

## 2019-10-06 DIAGNOSIS — R7989 Other specified abnormal findings of blood chemistry: Secondary | ICD-10-CM

## 2019-10-06 NOTE — Progress Notes (Signed)
Order placed for abdominal ultrasound.   

## 2019-10-09 ENCOUNTER — Ambulatory Visit: Payer: Managed Care, Other (non HMO) | Admitting: Dermatology

## 2019-10-09 ENCOUNTER — Other Ambulatory Visit: Payer: Self-pay

## 2019-10-09 DIAGNOSIS — L2389 Allergic contact dermatitis due to other agents: Secondary | ICD-10-CM | POA: Diagnosis not present

## 2019-10-09 MED ORDER — CLOBETASOL PROPIONATE 0.05 % EX CREA: TOPICAL_CREAM | CUTANEOUS | 2 refills | Status: DC

## 2019-10-09 NOTE — Progress Notes (Signed)
   Follow-Up Visit   Subjective  Carol Mahoney is a 44 y.o. female who presents for the following: Tattoo itchy (has tattoo on left forearm for 1.5 years, has been itchy and swollen for past week only on side that has color. ).  The following portions of the chart were reviewed this encounter and updated as appropriate:      Review of Systems:  No other skin or systemic complaints except as noted in HPI or Assessment and Plan.  Objective  Well appearing patient in no apparent distress; mood and affect are within normal limits.  A focused examination was performed including Forearm. Relevant physical exam findings are noted in the Assessment and Plan.  Objective  Right flexor forearm: Within tattoo has slight elevation and mild scale within the green pigment, black and blue pigments are flat   Assessment & Plan  Allergic contact dermatitis due to other agents Right flexor forearm  Possible early allergic reaction to tattoo pigment Start Clobetasol cream Apply to affected area on colored side of ttoo on left forearm once to twice daily for itch. Avoid Face, groin, and underarm   Discussed if not improving or getting worse should consider having tattoo removed (laser).  Could also consider IL steroid injections  Topical steroids (such as triamcinolone, fluocinolone, fluocinonide, mometasone, clobetasol, halobetasol, betamethasone, hydrocortisone) can cause thinning and lightening of the skin if they are used for too long in the same area. Your physician has selected the right strength medicine for your problem and area affected on the body. Please use your medication only as directed by your physician to prevent side effects.    clobetasol cream (TEMOVATE) 0.05 % - Right flexor forearm  Return for as scheduled June for TBSE.   I, Donzetta Kohut, CMA, am acting as scribe for Brendolyn Patty, MD .  Documentation: I have reviewed the above documentation for accuracy and completeness,  and I agree with the above.  Brendolyn Patty MD

## 2019-10-09 NOTE — Patient Instructions (Addendum)
Recommend daily broad spectrum sunscreen SPF 30+ to sun-exposed areas, reapply every 2 hours as needed. Call for new or changing lesions.  Topical steroids (such as triamcinolone, fluocinolone, fluocinonide, mometasone, clobetasol, halobetasol, betamethasone, hydrocortisone) can cause thinning and lightening of the skin if they are used for too long in the same area. Your physician has selected the right strength medicine for your problem and area affected on the body. Please use your medication only as directed by your physician to prevent side effects.   

## 2019-10-10 ENCOUNTER — Ambulatory Visit
Admission: RE | Admit: 2019-10-10 | Discharge: 2019-10-10 | Disposition: A | Discharge status: Home / Self Care | Payer: Managed Care, Other (non HMO) | Source: Ambulatory Visit | Attending: Internal Medicine | Admitting: Internal Medicine

## 2019-10-10 DIAGNOSIS — R945 Abnormal results of liver function studies: Secondary | ICD-10-CM | POA: Insufficient documentation

## 2019-10-10 DIAGNOSIS — R7989 Other specified abnormal findings of blood chemistry: Secondary | ICD-10-CM

## 2019-10-15 ENCOUNTER — Other Ambulatory Visit: Payer: Self-pay

## 2019-10-15 DIAGNOSIS — R197 Diarrhea, unspecified: Secondary | ICD-10-CM

## 2019-10-16 ENCOUNTER — Other Ambulatory Visit: Payer: Self-pay

## 2019-10-16 DIAGNOSIS — R197 Diarrhea, unspecified: Secondary | ICD-10-CM

## 2019-10-16 MED ORDER — DIPHENOXYLATE-ATROPINE 2.5-0.025 MG PO TABS: 1.0000 | ORAL_TABLET | Freq: Four times a day (QID) | ORAL | 5 refills | Status: DC | PRN

## 2019-10-16 MED ORDER — ALOSETRON HCL 1 MG PO TABS: 1.0000 mg | ORAL_TABLET | Freq: Two times a day (BID) | ORAL | 3 refills | Status: DC

## 2019-10-17 ENCOUNTER — Telehealth: Payer: Self-pay | Admitting: Internal Medicine

## 2019-10-17 ENCOUNTER — Encounter: Payer: Self-pay | Admitting: Internal Medicine

## 2019-10-17 NOTE — Telephone Encounter (Signed)
-----   Message from Lucilla Lame, MD sent at 10/16/2019  7:32 AM EDT ----- Regarding: RE: follow up Hi,  I would be happy to see her. Nothing more needs to be sent off. Will likely get an EUS to make sure there is nothing else going on. She contacted my office yesterday for a refill of her Lomotil and we refilled that. I will add my CMA to this so that she can get things started.  Thanks Darren ----- Message ----- From: Einar Pheasant, MD Sent: 10/16/2019   7:08 AM EDT To: Lucilla Lame, MD Subject: follow up                                      You have seen this patient previously for chronic diarrhea.  She has had chronic GI issues.  She has described some persistent "irritable bowel symptoms".  Some intermittent abdominal discomfort.  No acute issues.  Was found recently to have elevated liver enzymes.  Abdominal ultrasound revealed apparent changes c/w pancreatitis and suspected pseudocyst.  Also noted pancreatic ductal dilatation.  I discussed the results with her and discussed obtaining CT/MRI.  She has a defibrillator.  She is not sure if she can have MRI, but does not think she can. (will need to confirm type with cardiologist).  She is allergic to Iodine and had angioedema with shellfish.  I would like for her to follow up with you.  I can schedule the appointment, but I did not know if there was other testing you needed prior to seeing her.    Thank you for your help Einar Pheasant

## 2019-10-30 ENCOUNTER — Ambulatory Visit: Payer: Managed Care, Other (non HMO) | Admitting: Dermatology

## 2019-11-21 ENCOUNTER — Other Ambulatory Visit: Payer: Self-pay

## 2019-11-21 ENCOUNTER — Ambulatory Visit (INDEPENDENT_AMBULATORY_CARE_PROVIDER_SITE_OTHER): Payer: Managed Care, Other (non HMO) | Admitting: Gastroenterology

## 2019-11-21 ENCOUNTER — Encounter: Payer: Self-pay | Admitting: Gastroenterology

## 2019-11-21 ENCOUNTER — Encounter: Payer: Self-pay | Admitting: Internal Medicine

## 2019-11-21 VITALS — BP 107/70 | HR 70 | Temp 96.8°F | Ht 67.0 in | Wt 138.0 lb

## 2019-11-21 DIAGNOSIS — R748 Abnormal levels of other serum enzymes: Secondary | ICD-10-CM | POA: Diagnosis not present

## 2019-11-21 DIAGNOSIS — K862 Cyst of pancreas: Secondary | ICD-10-CM | POA: Diagnosis not present

## 2019-11-21 NOTE — Telephone Encounter (Signed)
LMTCB

## 2019-11-21 NOTE — Telephone Encounter (Signed)
Patient having urgency and frequency. Confirmed no other acute symptoms. Unable to come in today to leave urine. Scheduled with Maudie Mercury tomorrow at 8:30. Will arrive a few minutes early to give urine sample.

## 2019-11-21 NOTE — Progress Notes (Signed)
Primary Care Physician: Einar Pheasant, MD  Primary Gastroenterologist:  Dr. Lucilla Lame  Chief Complaint  Patient presents with  . Follow up diarrhea  . Elevated Hepatic Enzymes    HPI: Carol Mahoney is a 44 y.o. female here for follow-up of diarrhea.  The patient had seen me back in 2017 and had followed up since then for her diarrhea.  The patient was started on Lotronex.  The last contact with our office was in April 2020 for refill of this medication.  The patient's last colonoscopy was in 2008 with random biopsies not showing any sign of inflammation.  The patient recently had a ultrasound of the abdomen that showed her to have pancreatitis with a pseudocyst and a MRI or CT scan was recommended.  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  She is not a candidate for an MRI.  The patient has had a CT scan with contrast in the past but states she is allergic to iodine and had to be premedicated prior to having her last CT scan. The patient reports that she continues to have some diarrhea and abdominal discomfort that comes and goes.  The patient has been on Lotronex and states that she is much better on the Lotronex then which she does not take it.  She denies any unexplained weight loss fevers chills nausea vomiting or abdominal pain consistent with pancreatitis. The patient was also noted to have abnormal liver enzymes.  She denies taking any new medications.  She also denies any alcohol abuse.  Past Medical History:  Diagnosis Date  . Anemia   . Anxiety   . Depression   . GERD (gastroesophageal reflux disease)   . H/O hiatal hernia   . Hypertension   . Hypertr obst cardiomyop    mitral regurgitation, LAE - followed at [facility]  . Hypothyroidism   . Migraine headache   . PONV (postoperative nausea and vomiting)     Current Outpatient Medications  Medication Sig Dispense Refill  . alosetron (LOTRONEX) 1 MG tablet Take 1 tablet (1 mg total)  by mouth 2 (two) times daily. 180 tablet 3  . ALPRAZolam (XANAX) 0.5 MG tablet 0.5 mg 3 (three) times daily as needed.     . Cholecalciferol 25 MCG (1000 UT) capsule Take 1,000 Units by mouth daily.    . Ciclopirox 1 % shampoo Shampoo with  a small amount   as directed 3x/wk to daily. Massage into scalp, let sit a few minutes before rinsing.    . clobetasol (TEMOVATE) 0.05 % external solution APPLY A SMALL AMOUNT TO THE AFFECTED AREA ONCE A DAY AS NEEDED. AVOID F/GIVE/A    . clobetasol cream (TEMOVATE) 0.05 % Apply to affected area on colored side of ttoo on left forearm once to twice daily for itch. Avoid Face, groin, and underarm 30 g 2  . cyclobenzaprine (FLEXERIL) 5 MG tablet Take 1 tablet (5 mg total) by mouth at bedtime as needed for muscle spasms. 30 tablet 0  . diphenoxylate-atropine (LOMOTIL) 2.5-0.025 MG tablet Take 1 tablet by mouth 4 (four) times daily as needed for diarrhea or loose stools. 120 tablet 5  . Doxycycline Hyclate 50 MG TBEC Take 1 tablet by mouth 2 (two) times daily.    Marland Kitchen EPINEPHrine (EPIPEN 2-PAK) 0.3 mg/0.3 mL IJ SOAJ injection USE AS DIRECTED ON PACKAGE 2 Device 1  . furosemide (LASIX) 20 MG tablet Take 20 mg by mouth daily as needed.     Marland Kitchen  hydrocortisone (ANUSOL-HC) 25 MG suppository Place 1 suppository (25 mg total) rectally at bedtime. 12 suppository 0  . levothyroxine (SYNTHROID) 88 MCG tablet Take 1 tablet (88 mcg total) by mouth daily before breakfast. 90 tablet 0  . ondansetron (ZOFRAN) 4 MG tablet Take 1 tablet (4 mg total) by mouth 2 (two) times daily as needed for nausea or vomiting. 20 tablet 0  . pantoprazole (PROTONIX) 40 MG tablet TAKE 1 TABLET BY MOUTH  DAILY 90 tablet 3  . SOOLANTRA 1 % CREA Apply to face qhs    . SUMAtriptan (IMITREX) 50 MG tablet Take 1 tablet by mouth  every 2 hours as needed for migraine(s) 54 tablet 2  . tacrolimus (PROTOPIC) 0.1 % ointment     . traZODone (DESYREL) 50 MG tablet 50 mg. Takes 1/2 tablet as needed at bedtime    .  TRINTELLIX 20 MG TABS tablet TK 1 T PO QAM    . vitamin B-12 (CYANOCOBALAMIN) 1000 MCG tablet Take 1 tablet (1,000 mcg total) by mouth daily. 30 tablet 3   No current facility-administered medications for this visit.    Allergies as of 11/21/2019 - Review Complete 11/21/2019  Allergen Reaction Noted  . Carafate [sucralfate] Rash and Hives 04/19/2011  . Chloraprep one step [chlorhexidine gluconate] Itching and Hives 10/14/2011  . Shellfish-derived products Swelling 09/04/2015  . Vancomycin Itching and Dermatitis 10/20/2011  . Penicillins Hives and Rash 10/14/2011  . Sulfa antibiotics Hives and Rash 10/14/2011  . Soap  02/01/2011  . Trimethoprim  02/01/2011  . Iodine Rash 10/20/2011    ROS:  General: Negative for anorexia, weight loss, fever, chills, fatigue, weakness. ENT: Negative for hoarseness, difficulty swallowing , nasal congestion. CV: Negative for chest pain, angina, palpitations, dyspnea on exertion, peripheral edema.  Respiratory: Negative for dyspnea at rest, dyspnea on exertion, cough, sputum, wheezing.  GI: See history of present illness. GU:  Negative for dysuria, hematuria, urinary incontinence, urinary frequency, nocturnal urination.  Endo: Negative for unusual weight change.    Physical Examination:   BP 107/70   Pulse 70   Temp (!) 96.8 F (36 C) (Temporal)   Ht 5\' 7"  (1.702 m)   Wt 138 lb (62.6 kg)   LMP 10/08/2011   BMI 21.61 kg/m   General: Well-nourished, well-developed in no acute distress.  Eyes: No icterus. Conjunctivae pink. Lungs: Clear to auscultation bilaterally. Non-labored. Heart: Regular rate and rhythm, positive systolic murmur. No rubs or gallops.  Abdomen: Bowel sounds are normal, nontender, nondistended, no hepatosplenomegaly or masses, no abdominal bruits or hernia , no rebound or guarding.   Extremities: No lower extremity edema. No clubbing or deformities. Neuro: Alert and oriented x 3.  Grossly intact. Skin: Warm and dry, no  jaundice.   Psych: Alert and cooperative, normal mood and affect.  Labs:    Imaging Studies: No results found.  Assessment and Plan:   Carol Mahoney is a 44 y.o. y/o female who comes in today with a history of diarrhea and irritable bowel syndrome.  The patient has not had a colonoscopy since 2008.  The patient will be set up for colonoscopy.  The patient will also be set up for a CT scan to better evaluate her dilated pancreatic duct and pseudocyst with her ultrasound suggested pancreatitis.  The patient's liver enzymes have also been found to be elevated she will have lab sent off for possible causes of abnormal liver enzymes.     Lucilla Lame, MD. Marval Regal    Note: This  dictation was prepared with Dragon dictation along with smaller phrase technology. Any transcriptional errors that result from this process are unintentional.

## 2019-11-21 NOTE — Telephone Encounter (Signed)
Pt is unsure on whether she needs an appt or can just give urine sample. Please call to advise. No appts available until tomorrow with Dawson Bills, NP.

## 2019-11-22 ENCOUNTER — Other Ambulatory Visit: Payer: Self-pay

## 2019-11-22 ENCOUNTER — Telehealth: Payer: Self-pay | Admitting: Internal Medicine

## 2019-11-22 ENCOUNTER — Encounter: Payer: Self-pay | Admitting: Nurse Practitioner

## 2019-11-22 ENCOUNTER — Ambulatory Visit (INDEPENDENT_AMBULATORY_CARE_PROVIDER_SITE_OTHER): Payer: Managed Care, Other (non HMO) | Admitting: Nurse Practitioner

## 2019-11-22 VITALS — BP 104/62 | HR 60 | Temp 97.5°F | Ht 67.0 in | Wt 137.0 lb

## 2019-11-22 DIAGNOSIS — R102 Pelvic and perineal pain: Secondary | ICD-10-CM | POA: Diagnosis not present

## 2019-11-22 DIAGNOSIS — R300 Dysuria: Secondary | ICD-10-CM | POA: Diagnosis not present

## 2019-11-22 DIAGNOSIS — I495 Sick sinus syndrome: Secondary | ICD-10-CM

## 2019-11-22 LAB — POCT URINALYSIS DIPSTICK
Bilirubin, UA: NEGATIVE
Blood, UA: NEGATIVE
Glucose, UA: NEGATIVE
Ketones, UA: NEGATIVE
Leukocytes, UA: NEGATIVE
Nitrite, UA: NEGATIVE
Protein, UA: POSITIVE — AB
Spec Grav, UA: >=1.03 — AB (ref 1.010–1.025)
Urobilinogen, UA: NEGATIVE E.U./dL — AB
pH, UA: 6 (ref 5.0–8.0)

## 2019-11-22 LAB — HEPATIC FUNCTION PANEL
ALT: 42 IU/L — ABNORMAL HIGH (ref 0–32)
AST: 36 IU/L (ref 0–40)
Albumin: 3.9 g/dL (ref 3.8–4.8)
Alkaline Phosphatase: 92 IU/L (ref 48–121)
Bilirubin Total: 0.7 mg/dL (ref 0.0–1.2)
Bilirubin, Direct: 0.22 mg/dL (ref 0.00–0.40)
Total Protein: 6.1 g/dL (ref 6.0–8.5)

## 2019-11-22 LAB — ANA: Anti Nuclear Antibody (ANA): NEGATIVE

## 2019-11-22 LAB — ANTI-SMOOTH MUSCLE ANTIBODY, IGG: Smooth Muscle Ab: 8 Units (ref 0–19)

## 2019-11-22 LAB — CERULOPLASMIN: Ceruloplasmin: 20.3 mg/dL (ref 19.0–39.0)

## 2019-11-22 LAB — HEPATITIS B SURFACE ANTIGEN: Hepatitis B Surface Ag: NEGATIVE

## 2019-11-22 LAB — URINALYSIS, MICROSCOPIC ONLY: RBC / HPF: NONE SEEN (ref 0–?)

## 2019-11-22 LAB — MITOCHONDRIAL ANTIBODIES: Mitochondrial Ab: <20 Units (ref 0.0–20.0)

## 2019-11-22 LAB — IRON AND TIBC
Iron Saturation: 36 % (ref 15–55)
Iron: 97 ug/dL (ref 27–159)
Total Iron Binding Capacity: 272 ug/dL (ref 250–450)
UIBC: 175 ug/dL (ref 131–425)

## 2019-11-22 LAB — HEPATITIS C ANTIBODY: Hep C Virus Ab: <0.1 s/co ratio (ref 0.0–0.9)

## 2019-11-22 LAB — HEPATITIS A ANTIBODY, TOTAL: hep A Total Ab: POSITIVE — AB

## 2019-11-22 LAB — HEPATITIS B SURFACE ANTIBODY,QUALITATIVE: Hep B Surface Ab, Qual: NONREACTIVE

## 2019-11-22 LAB — ALPHA-1-ANTITRYPSIN: A-1 Antitrypsin: 116 mg/dL (ref 101–187)

## 2019-11-22 LAB — FERRITIN: Ferritin: 96 ng/mL (ref 15–150)

## 2019-11-22 NOTE — Progress Notes (Signed)
Established Patient Office Visit  Subjective:  Patient ID: Carol Mahoney, female    DOB: 01/12/1976  Age: 44 y.o. MRN: [id number]  CC:  Chief Complaint  Patient presents with  . Acute Visit    UTI    HPI Carol Mahoney is a 44 year old who presents for a 2 week history of urinary frequency and urgency.  She was seen in the acute care and had a urinalysis done with negative culture.  She started herself on Azo and that seemed to help.  She continues to have symptoms and wanted a second opinion on possibility of urinary tract infection.  She still has urinary frequency and has gotten up at night to void which is very unusual for her.  She has had no vaginal discharge.  She has no burning, pelvic pain, fevers or chills.  She has chronic problems with thoracic back discomfort when she bends in certain positions and has been aggravated but she attributes that to taking care of her grand baby and doing lifting.  Past Medical History:  Diagnosis Date  . Anemia   . Anxiety   . Depression   . GERD (gastroesophageal reflux disease)   . H/O hiatal hernia   . Hypertension   . Hypertr obst cardiomyop    mitral regurgitation, LAE - followed at [facility]  . Hypothyroidism   . Migraine headache   . PONV (postoperative nausea and vomiting)     Past Surgical History:  Procedure Laterality Date  . BUNIONECTOMY    . CHOLECYSTECTOMY    . HIATAL HERNIA REPAIR    . IMPLANTABLE CARDIOVERTER DEFIBRILLATOR REVISION  05/21/14   [facility]  . PARTIAL GASTRECTOMY    . TONSILLECTOMY      Family History  Problem Relation Age of Onset  . Anesthesia problems Father   . Hypertension Mother   . Hashimoto's thyroiditis Maternal Grandmother   . Hypertension Maternal Grandmother   . Melanoma Maternal Grandmother   . Breast cancer Neg Hx   . Colon cancer Neg Hx     Social History   Socioeconomic History  . Marital status: Single    Spouse name: Not on file  . Number of children: 1  . Years of education: Not  on file  . Highest education level: Not on file  Occupational History  . Occupation: PROGRAM Printmaker: [employer]  Tobacco Use  . Smoking status: Never Smoker  . Smokeless tobacco: Never Used  Vaping Use  . Vaping Use: Never used  Substance and Sexual Activity  . Alcohol use: No    Alcohol/week: 0.0 standard drinks  . Drug use: No  . Sexual activity: Not Currently  Other Topics Concern  . Not on file  Social History Narrative  . Not on file   Social Determinants of Health   Financial Resource Strain:   . Difficulty of Paying Living Expenses:   Food Insecurity:   . Worried About Charity fundraiser in the Last Year:   . Arboriculturist in the Last Year:   Transportation Needs:   . Film/video editor (Medical):   Marland Kitchen Lack of Transportation (Non-Medical):   Physical Activity:   . Days of Exercise per Week:   . Minutes of Exercise per Session:   Stress:   . Feeling of Stress :   Social Connections:   . Frequency of Communication with Friends and Family:   . Frequency of Social Gatherings with Friends and Family:   .  Attends Religious Services:   . Active Member of Clubs or Organizations:   . Attends Archivist Meetings:   Marland Kitchen Marital Status:   Intimate Partner Violence:   . Fear of Current or Ex-Partner:   . Emotionally Abused:   Marland Kitchen Physically Abused:   . Sexually Abused:     Outpatient Medications Prior to Visit  Medication Sig Dispense Refill  . alosetron (LOTRONEX) 1 MG tablet Take 1 tablet (1 mg total) by mouth 2 (two) times daily. 180 tablet 3  . ALPRAZolam (XANAX) 0.5 MG tablet 0.5 mg 3 (three) times daily as needed.     . Cholecalciferol 25 MCG (1000 UT) capsule Take 1,000 Units by mouth daily.    . Ciclopirox 1 % shampoo Shampoo with  a small amount   as directed 3x/wk to daily. Massage into scalp, let sit a few minutes before rinsing.    . clobetasol (TEMOVATE) 0.05 % external solution APPLY A SMALL AMOUNT TO THE AFFECTED AREA ONCE A DAY  AS NEEDED. AVOID F/GIVE/A    . clobetasol cream (TEMOVATE) 0.05 % Apply to affected area on colored side of ttoo on left forearm once to twice daily for itch. Avoid Face, groin, and underarm 30 g 2  . cyclobenzaprine (FLEXERIL) 5 MG tablet Take 1 tablet (5 mg total) by mouth at bedtime as needed for muscle spasms. 30 tablet 0  . diphenoxylate-atropine (LOMOTIL) 2.5-0.025 MG tablet Take 1 tablet by mouth 4 (four) times daily as needed for diarrhea or loose stools. 120 tablet 5  . EPINEPHrine (EPIPEN 2-PAK) 0.3 mg/0.3 mL IJ SOAJ injection USE AS DIRECTED ON PACKAGE 2 Device 1  . furosemide (LASIX) 20 MG tablet Take 20 mg by mouth daily as needed.     . hydrocortisone (ANUSOL-HC) 25 MG suppository Place 1 suppository (25 mg total) rectally at bedtime. 12 suppository 0  . levothyroxine (SYNTHROID) 88 MCG tablet Take 1 tablet (88 mcg total) by mouth daily before breakfast. 90 tablet 0  . ondansetron (ZOFRAN) 4 MG tablet Take 1 tablet (4 mg total) by mouth 2 (two) times daily as needed for nausea or vomiting. 20 tablet 0  . pantoprazole (PROTONIX) 40 MG tablet TAKE 1 TABLET BY MOUTH  DAILY 90 tablet 3  . SOOLANTRA 1 % CREA Apply to face qhs    . SPRAVATO, 56 MG DOSE, 28 MG/DEVICE SOPK     . SUMAtriptan (IMITREX) 50 MG tablet Take 1 tablet by mouth  every 2 hours as needed for migraine(s) 54 tablet 2  . tacrolimus (PROTOPIC) 0.1 % ointment     . traZODone (DESYREL) 50 MG tablet 50 mg. Takes 1/2 tablet as needed at bedtime    . TRINTELLIX 20 MG TABS tablet TK 1 T PO QAM    . vitamin B-12 (CYANOCOBALAMIN) 1000 MCG tablet Take 1 tablet (1,000 mcg total) by mouth daily. 30 tablet 3  . Doxycycline Hyclate 50 MG TBEC Take 1 tablet by mouth 2 (two) times daily.     No facility-administered medications prior to visit.    Allergies  Allergen Reactions  . Carafate [Sucralfate] Rash and Hives  . Chloraprep One Step [Chlorhexidine Gluconate] Itching and Hives  . Shellfish-Derived Products Swelling    Other  reaction(s): Angioedema Esophageal and lip swelling  . Vancomycin Itching and Dermatitis  . Penicillins Hives and Rash  . Sulfa Antibiotics Hives and Rash  . Soap   . Trimethoprim     Other reaction(s): Unknown  . Iodine Rash  Review of Systems  Constitutional: Negative for chills and fever.  HENT: Negative.   Respiratory: Negative.   Cardiovascular: Negative.   Gastrointestinal: Negative for abdominal pain, constipation and diarrhea.  Endocrine: Negative.   Genitourinary: Positive for frequency and urgency. Negative for dysuria, hematuria, menstrual problem, pelvic pain and vaginal discharge.  Musculoskeletal: Positive for back pain.  Allergic/Immunologic: Negative.   Neurological: Negative.   Hematological: Negative.   Psychiatric/Behavioral: Negative.       Objective:    Physical Exam Vitals reviewed.  Constitutional:      Appearance: Normal appearance.  HENT:     Head: Normocephalic.  Cardiovascular:     Rate and Rhythm: Normal rate and regular rhythm.     Heart sounds: Murmur heard.   Pulmonary:     Effort: Pulmonary effort is normal.     Breath sounds: Normal breath sounds.  Abdominal:     Palpations: Abdomen is soft.     Tenderness: There is no abdominal tenderness.  Musculoskeletal:        General: Normal range of motion.     Cervical back: Normal range of motion and neck supple.  Skin:    General: Skin is warm and dry.  Neurological:     General: No focal deficit present.     Mental Status: She is alert and oriented to person, place, and time.  Psychiatric:        Mood and Affect: Mood normal.        Behavior: Behavior normal.     BP 104/62 (BP Location: Left Arm, Patient Position: Sitting, Cuff Size: Normal)   Pulse 60   Temp (!) 97.5 F (36.4 C) (Oral)   Ht 5\' 7"  (1.702 m)   Wt 137 lb (62.1 kg)   LMP 10/08/2011   SpO2 98%   BMI 21.46 kg/m  Wt Readings from Last 3 Encounters:  11/22/19 137 lb (62.1 kg)  11/21/19 138 lb (62.6 kg)    09/05/19 142 lb 9.6 oz (64.7 kg)     Health Maintenance Due  Topic Date Due  . HIV Screening  Never done  . TETANUS/TDAP  Never done  . PAP SMEAR-Modifier  Never done    There are no preventive care reminders to display for this patient.  Lab Results  Component Value Date   TSH 1.600 10/03/2019   Lab Results  Component Value Date   WBC 3.8 (L) 09/03/2019   HGB 13.0 09/03/2019   HCT 38.1 09/03/2019   MCV 88.8 09/03/2019   PLT 129 (L) 09/03/2019   Lab Results  Component Value Date   NA 142 09/12/2019   K 4.3 09/12/2019   CO2 25 09/12/2019   GLUCOSE 72 09/12/2019   BUN 11 09/12/2019   CREATININE 0.86 09/12/2019   BILITOT 0.7 11/21/2019   ALKPHOS 92 11/21/2019   AST 36 11/21/2019   ALT 42 (H) 11/21/2019   PROT 6.1 11/21/2019   ALBUMIN 3.9 11/21/2019   CALCIUM 9.6 09/12/2019   ANIONGAP 8 10/24/2018   Lab Results  Component Value Date   CHOL 177 03/13/2019   Lab Results  Component Value Date   HDL 60 03/13/2019   Lab Results  Component Value Date   LDLCALC 107 (H) 03/13/2019   Lab Results  Component Value Date   TRIG 53 03/13/2019   Lab Results  Component Value Date   CHOLHDL 3.0 03/13/2019   Lab Results  Component Value Date   HGBA1C 4.9 12/12/2014      Assessment &  Plan:   Problem List Items Addressed This Visit      Other   Dysuria - Primary   Relevant Orders   Urine Microscopic (Completed)   Urine Culture (Completed)   POCT urinalysis dipstick (Completed)    Other Visit Diagnoses    Pelvic pressure in female       Relevant Orders   US PELVIC COMPLETE WITH TRANSVAGINAL      No orders of the defined types were placed in this encounter.  We are waiting on the urinalysis and urine culture report.  For your pelvic pressure, and recent negative urinalysis I will order pelvic ultrasound.  If this is negative, will consider urology consult.  Follow-up: Return in about 2 weeks (around 12/06/2019).   This visit occurred during the  SARS-CoV-2 public health emergency.  Safety protocols were in place, including screening questions prior to the visit, additional usage of staff PPE, and extensive cleaning of exam room while observing appropriate contact time as indicated for disinfecting solutions.   Denice Paradise, NP

## 2019-11-22 NOTE — Telephone Encounter (Signed)
lft vm to call ofc to sch [place]

## 2019-11-22 NOTE — Patient Instructions (Addendum)
We are waiting on the urinalysis and urine culture report.  For your pelvic pressure, and recent negative urinalysis I will order pelvic ultrasound.  If this is negative, will consider urology consult.  Please make follow-up appointment with Dr. Nicki Reaper.

## 2019-11-23 LAB — URINE CULTURE
MICRO NUMBER:: 10657839
Result:: NO GROWTH
SPECIMEN QUALITY:: ADEQUATE

## 2019-11-30 ENCOUNTER — Telehealth: Payer: Self-pay

## 2019-11-30 NOTE — Telephone Encounter (Signed)
-----   Message from Lucilla Lame, MD sent at 11/28/2019  8:10 AM EDT ----- Let the patient know that her blood work showed her to need a vaccination for hepatitis B but she is immune to hepatitis A.  Her liver enzymes have also come down substantially with only one of her liver enzymes being elevated now.  I would recommend repeating the liver enzymes in 1 month to see if they have come back to normal.

## 2019-11-30 NOTE — Telephone Encounter (Signed)
Pt notified of results via mychart.  

## 2019-12-04 ENCOUNTER — Ambulatory Visit
Admission: RE | Admit: 2019-12-04 | Discharge: 2019-12-04 | Disposition: A | Discharge status: Home / Self Care | Payer: Managed Care, Other (non HMO) | Source: Ambulatory Visit | Attending: Nurse Practitioner | Admitting: Nurse Practitioner

## 2019-12-04 ENCOUNTER — Other Ambulatory Visit: Payer: Self-pay

## 2019-12-04 DIAGNOSIS — R102 Pelvic and perineal pain: Secondary | ICD-10-CM | POA: Insufficient documentation

## 2019-12-05 NOTE — Addendum Note (Signed)
Addended by: Denice Paradise A on: 12/05/2019 11:05 AM   Modules accepted: Orders

## 2019-12-10 ENCOUNTER — Telehealth: Payer: Self-pay | Admitting: Internal Medicine

## 2019-12-10 NOTE — Telephone Encounter (Signed)
Rejection Reason - Patient Declined - pt will discuss with PCP and call us back" [place] said about 2 hours ago

## 2019-12-12 ENCOUNTER — Other Ambulatory Visit: Payer: Self-pay | Admitting: Internal Medicine

## 2019-12-12 ENCOUNTER — Encounter: Payer: Self-pay | Admitting: Internal Medicine

## 2019-12-12 ENCOUNTER — Other Ambulatory Visit: Payer: Self-pay

## 2019-12-12 ENCOUNTER — Ambulatory Visit: Payer: Managed Care, Other (non HMO) | Admitting: Internal Medicine

## 2019-12-12 VITALS — BP 102/66 | HR 62 | Temp 97.8°F | Resp 19 | Ht 67.0 in | Wt 136.1 lb

## 2019-12-12 DIAGNOSIS — I472 Ventricular tachycardia: Secondary | ICD-10-CM | POA: Diagnosis not present

## 2019-12-12 DIAGNOSIS — R945 Abnormal results of liver function studies: Secondary | ICD-10-CM | POA: Diagnosis not present

## 2019-12-12 DIAGNOSIS — E039 Hypothyroidism, unspecified: Secondary | ICD-10-CM

## 2019-12-12 DIAGNOSIS — Z95810 Presence of automatic (implantable) cardiac defibrillator: Secondary | ICD-10-CM

## 2019-12-12 DIAGNOSIS — R7989 Other specified abnormal findings of blood chemistry: Secondary | ICD-10-CM

## 2019-12-12 DIAGNOSIS — Z23 Encounter for immunization: Secondary | ICD-10-CM | POA: Diagnosis not present

## 2019-12-12 DIAGNOSIS — F341 Dysthymic disorder: Secondary | ICD-10-CM

## 2019-12-12 DIAGNOSIS — K219 Gastro-esophageal reflux disease without esophagitis: Secondary | ICD-10-CM

## 2019-12-12 DIAGNOSIS — E559 Vitamin D deficiency, unspecified: Secondary | ICD-10-CM | POA: Diagnosis not present

## 2019-12-12 DIAGNOSIS — D508 Other iron deficiency anemias: Secondary | ICD-10-CM

## 2019-12-12 DIAGNOSIS — I421 Obstructive hypertrophic cardiomyopathy: Secondary | ICD-10-CM

## 2019-12-12 DIAGNOSIS — D696 Thrombocytopenia, unspecified: Secondary | ICD-10-CM

## 2019-12-12 DIAGNOSIS — I4729 Other ventricular tachycardia: Secondary | ICD-10-CM

## 2019-12-12 NOTE — Progress Notes (Signed)
Patient ID: Carol Mahoney, female   DOB: 1975/06/06, 44 y.o.   MRN: [id number]   Subjective:    Patient ID: Carol Mahoney, female    DOB: [birth year], 44 y.o.   MRN: [id number]  HPI This visit occurred during the SARS-CoV-2 public health emergency.  Safety protocols were in place, including screening questions prior to the visit, additional usage of staff PPE, and extensive cleaning of exam room while observing appropriate contact time as indicated for disinfecting solutions.  Patient here for a scheduled follow up.  Was recently evaluated by Dawson Bills for urinary urgency.  Urine ok.  Pelvic ultrasound ok.  Kim had discussed urology referral. She is doing better.  Desires no referral at this time.  Tries to stay active.  Increased stress with family/home issues.  Taking care of her new grandson.  Discussed with her today.  Her mother is trying to help her.  Seeing psych. Discussed f/u.  No chest pain.  Breathing stable.  Sees Dr Grayland Ormond for IDA refractory to oral iron.  Last evaluated 08/2019.  hgb wnl.  Recommended f/u in 6 months.  Seeing Dr Allen Norris for diarrhea.  Improved with lotronex.  Also evaluated for abnormal liver enzymes.  Lab work revealed liver function tests to be improved.  Needs hepatitis B vaccine. Discussed with her today.  No abdominal pain.  No increased lower extremity swelling.    Past Medical History:  Diagnosis Date  . Anemia   . Anxiety   . Depression   . GERD (gastroesophageal reflux disease)   . H/O hiatal hernia   . Hypertension   . Hypertr obst cardiomyop    mitral regurgitation, LAE - followed at [facility]  . Hypothyroidism   . Migraine headache   . PONV (postoperative nausea and vomiting)    Past Surgical History:  Procedure Laterality Date  . BUNIONECTOMY    . CHOLECYSTECTOMY    . HIATAL HERNIA REPAIR    . IMPLANTABLE CARDIOVERTER DEFIBRILLATOR REVISION  05/21/14   [facility]  . PARTIAL GASTRECTOMY    . TONSILLECTOMY     Family History  Problem Relation Age of Onset   . Anesthesia problems Father   . Hypertension Mother   . Hashimoto's thyroiditis Maternal Grandmother   . Hypertension Maternal Grandmother   . Melanoma Maternal Grandmother   . Breast cancer Neg Hx   . Colon cancer Neg Hx    Social History   Socioeconomic History  . Marital status: Single    Spouse name: Not on file  . Number of children: 1  . Years of education: Not on file  . Highest education level: Not on file  Occupational History  . Occupation: PROGRAM Printmaker: [employer]  Tobacco Use  . Smoking status: Never Smoker  . Smokeless tobacco: Never Used  Vaping Use  . Vaping Use: Never used  Substance and Sexual Activity  . Alcohol use: No    Alcohol/week: 0.0 standard drinks  . Drug use: No  . Sexual activity: Not Currently  Other Topics Concern  . Not on file  Social History Narrative  . Not on file   Social Determinants of Health   Financial Resource Strain:   . Difficulty of Paying Living Expenses:   Food Insecurity:   . Worried About Charity fundraiser in the Last Year:   . Arboriculturist in the Last Year:   Transportation Needs:   . Film/video editor (Medical):   Marland Kitchen Lack  of Transportation (Non-Medical):   Physical Activity:   . Days of Exercise per Week:   . Minutes of Exercise per Session:   Stress:   . Feeling of Stress :   Social Connections:   . Frequency of Communication with Friends and Family:   . Frequency of Social Gatherings with Friends and Family:   . Attends Religious Services:   . Active Member of Clubs or Organizations:   . Attends Archivist Meetings:   Marland Kitchen Marital Status:     Outpatient Encounter Medications as of 12/12/2019  Medication Sig  . alosetron (LOTRONEX) 1 MG tablet Take 1 tablet (1 mg total) by mouth 2 (two) times daily.  Marland Kitchen ALPRAZolam (XANAX) 0.5 MG tablet 0.5 mg 3 (three) times daily as needed.   . Cholecalciferol 25 MCG (1000 UT) capsule Take 1,000 Units by mouth daily.  . clobetasol cream  (TEMOVATE) 0.05 % Apply to affected area on colored side of ttoo on left forearm once to twice daily for itch. Avoid Face, groin, and underarm  . diphenoxylate-atropine (LOMOTIL) 2.5-0.025 MG tablet Take 1 tablet by mouth 4 (four) times daily as needed for diarrhea or loose stools.  Marland Kitchen EPINEPHrine (EPIPEN 2-PAK) 0.3 mg/0.3 mL IJ SOAJ injection USE AS DIRECTED ON PACKAGE  . furosemide (LASIX) 20 MG tablet Take 20 mg by mouth daily as needed.   . pantoprazole (PROTONIX) 40 MG tablet TAKE 1 TABLET BY MOUTH  DAILY  . SOOLANTRA 1 % CREA Apply to face qhs  . SPRAVATO, 56 MG DOSE, 28 MG/DEVICE SOPK   . SUMAtriptan (IMITREX) 50 MG tablet Take 1 tablet by mouth  every 2 hours as needed for migraine(s)  . tacrolimus (PROTOPIC) 0.1 % ointment   . traZODone (DESYREL) 50 MG tablet 50 mg. Takes 1/2 tablet as needed at bedtime  . TRINTELLIX 20 MG TABS tablet TK 1 T PO QAM  . vitamin B-12 (CYANOCOBALAMIN) 1000 MCG tablet Take 1 tablet (1,000 mcg total) by mouth daily.  . [DISCONTINUED] levothyroxine (SYNTHROID) 88 MCG tablet Take 1 tablet (88 mcg total) by mouth daily before breakfast.  . cyclobenzaprine (FLEXERIL) 5 MG tablet Take 1 tablet (5 mg total) by mouth at bedtime as needed for muscle spasms. (Patient not taking: Reported on 12/12/2019)  . hydrocortisone (ANUSOL-HC) 25 MG suppository Place 1 suppository (25 mg total) rectally at bedtime. (Patient not taking: Reported on 12/12/2019)  . ondansetron (ZOFRAN) 4 MG tablet Take 1 tablet (4 mg total) by mouth 2 (two) times daily as needed for nausea or vomiting. (Patient not taking: Reported on 12/12/2019)  . [DISCONTINUED] Ciclopirox 1 % shampoo Shampoo with  a small amount   as directed 3x/wk to daily. Massage into scalp, let sit a few minutes before rinsing. (Patient not taking: Reported on 12/12/2019)  . [DISCONTINUED] clobetasol (TEMOVATE) 0.05 % external solution APPLY A SMALL AMOUNT TO THE AFFECTED AREA ONCE A DAY AS NEEDED. AVOID F/GIVE/A   No  facility-administered encounter medications on file as of 12/12/2019.    Review of Systems  Constitutional: Negative for appetite change and unexpected weight change.  HENT: Negative for congestion and sinus pressure.   Respiratory: Negative for cough, chest tightness and shortness of breath.   Cardiovascular: Negative for chest pain, palpitations and leg swelling.  Gastrointestinal: Negative for abdominal pain, nausea and vomiting.  Genitourinary: Negative for difficulty urinating and dysuria.  Musculoskeletal: Negative for joint swelling and myalgias.  Skin: Negative for color change and rash.  Neurological: Negative for dizziness, light-headedness and  headaches.  Psychiatric/Behavioral: Negative for agitation and dysphoric mood.       Objective:    Physical Exam Vitals reviewed.  Constitutional:      General: She is not in acute distress.    Appearance: Normal appearance.  HENT:     Head: Normocephalic and atraumatic.     Right Ear: External ear normal.     Left Ear: External ear normal.  Eyes:     General: No scleral icterus.       Right eye: No discharge.        Left eye: No discharge.     Conjunctiva/sclera: Conjunctivae normal.  Neck:     Thyroid: No thyromegaly.  Cardiovascular:     Rate and Rhythm: Normal rate and regular rhythm.  Pulmonary:     Effort: No respiratory distress.     Breath sounds: Normal breath sounds. No wheezing.  Abdominal:     General: Bowel sounds are normal.     Palpations: Abdomen is soft.     Tenderness: There is no abdominal tenderness.  Musculoskeletal:        General: No swelling or tenderness.     Cervical back: Neck supple. No tenderness.  Lymphadenopathy:     Cervical: No cervical adenopathy.  Skin:    Findings: No erythema or rash.  Neurological:     Mental Status: She is alert.  Psychiatric:        Mood and Affect: Mood normal.        Behavior: Behavior normal.     BP 102/66 (BP Location: Left Arm, Patient Position:  Sitting, Cuff Size: Normal)   Pulse 62   Temp 97.8 F (36.6 C) (Oral)   Resp 19   Ht 5\' 7"  (1.702 m)   Wt 136 lb 2 oz (61.7 kg)   LMP 10/08/2011   SpO2 98%   BMI 21.32 kg/m  Wt Readings from Last 3 Encounters:  12/12/19 136 lb 2 oz (61.7 kg)  11/22/19 137 lb (62.1 kg)  11/21/19 138 lb (62.6 kg)     Lab Results  Component Value Date   WBC 3.8 (L) 09/03/2019   HGB 13.0 09/03/2019   HCT 38.1 09/03/2019   PLT 129 (L) 09/03/2019   GLUCOSE 72 09/12/2019   CHOL 177 03/13/2019   TRIG 53 03/13/2019   HDL 60 03/13/2019   LDLCALC 107 (H) 03/13/2019   ALT 42 (H) 11/21/2019   AST 36 11/21/2019   NA 142 09/12/2019   K 4.3 09/12/2019   CL 102 09/12/2019   CREATININE 0.86 09/12/2019   BUN 11 09/12/2019   CO2 25 09/12/2019   TSH 1.600 10/03/2019   INR 1.0 09/26/2014   HGBA1C 4.9 12/12/2014    US PELVIC COMPLETE WITH TRANSVAGINAL  Result Date: 12/05/2019 CLINICAL DATA:  Initial evaluation for pelvic pressure. History of prior hysterectomy. EXAM: TRANSABDOMINAL AND TRANSVAGINAL ULTRASOUND OF PELVIS TECHNIQUE: Both transabdominal and transvaginal ultrasound examinations of the pelvis were performed. Transabdominal technique was performed for global imaging of the pelvis including uterus, ovaries, adnexal regions, and pelvic cul-de-sac. It was necessary to proceed with endovaginal exam following the transabdominal exam to visualize the pelvic structures. COMPARISON:  Prior CT from 03/06/2011. FINDINGS: Uterus Surgically absent.  No abnormality about the vaginal cuff. Endometrium Surgically absent. Right ovary Not visualized.  No adnexal mass. Left ovary Measurements: 1.7 x 1.5 x 1.2 cm = volume: 1.5 mL. Normal appearance/no adnexal mass. Other findings No abnormal free fluid. IMPRESSION: 1. Prior hysterectomy with non visualization  of the right ovary. No pelvic or adnexal mass. No abnormal free fluid. 2. Normal left ovary. Electronically Signed   By: Jeannine Boga M.D.   On: 12/05/2019  03:18       Assessment & Plan:   Problem List Items Addressed This Visit    ANXIETY DEPRESSION    Followed by Dr Toy Care.  Increased stress as outlined.        GERD (gastroesophageal reflux disease)    No upper symptoms reported. On protonix.        Hypertrophic obstructive cardiomyopathy (HCC)    S/p septal myomectomy.  Followed by Dr Mina Marble.  S/p ICD removal and replacement.  Follow.  Stable.       Hypothyroidism    On thyroid replacement.  Follow tsh.       ICD (implantable cardioverter-defibrillator) in place    Followed by Dr Mina Marble.       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  hgb wnl.  Doing well.  Did not require iron infusion at that visit.  Recommended f/u in 6 months.        NSVT (nonsustained ventricular tachycardia) ([place])    Followed by Dr Mina Marble.       Thrombocytopenia ([place])    Follow cbc.       Vitamin D deficiency    Follow vitamin D level.        Other Visit Diagnoses    Need for hepatitis B vaccination    -  Primary   Relevant Orders   Heplisav-B (HepB-CPG) Vaccine (Completed)   Abnormal liver function tests       Relevant Orders   Heplisav-B (HepB-CPG) Vaccine (Completed)       Einar Pheasant, MD

## 2019-12-13 NOTE — Telephone Encounter (Signed)
err

## 2019-12-23 ENCOUNTER — Encounter: Payer: Self-pay | Admitting: Internal Medicine

## 2019-12-23 NOTE — Assessment & Plan Note (Signed)
Followed by Dr Wang.  

## 2019-12-23 NOTE — Assessment & Plan Note (Signed)
Followed by Dr Toy Care.  Increased stress as outlined.

## 2019-12-23 NOTE — Assessment & Plan Note (Signed)
No upper symptoms reported.  On protonix.   

## 2019-12-23 NOTE — Assessment & Plan Note (Signed)
Follow vitamin D level.  

## 2019-12-23 NOTE — Assessment & Plan Note (Signed)
Follow cbc.  

## 2019-12-23 NOTE — Assessment & Plan Note (Signed)
S/p septal myomectomy.  Followed by Dr Mina Marble.  S/p ICD removal and replacement.  Follow.  Stable.

## 2019-12-23 NOTE — Assessment & Plan Note (Signed)
Seeing hematology.  hgb wnl.  Doing well.  Did not require iron infusion at that visit.  Recommended f/u in 6 months.

## 2019-12-23 NOTE — Assessment & Plan Note (Signed)
On thyroid replacement.  Follow tsh.  

## 2019-12-24 ENCOUNTER — Telehealth: Payer: Self-pay | Admitting: Internal Medicine

## 2019-12-24 DIAGNOSIS — Z0279 Encounter for issue of other medical certificate: Secondary | ICD-10-CM

## 2019-12-24 NOTE — Telephone Encounter (Signed)
Patient's Handicapp renewal was dropped off. Form is up front in Dr. Bary Leriche color folder.

## 2019-12-24 NOTE — Telephone Encounter (Signed)
Form filled out and placed in quick sign for signature

## 2019-12-25 NOTE — Telephone Encounter (Signed)
Signed and placed in box.   

## 2019-12-25 NOTE — Telephone Encounter (Signed)
Form placed up front for pick up. Patient is aware.

## 2020-01-18 ENCOUNTER — Other Ambulatory Visit: Payer: Self-pay | Admitting: Internal Medicine

## 2020-01-18 ENCOUNTER — Other Ambulatory Visit: Payer: Self-pay

## 2020-01-18 DIAGNOSIS — R197 Diarrhea, unspecified: Secondary | ICD-10-CM

## 2020-01-18 MED ORDER — ALOSETRON HCL 1 MG PO TABS: 1.0000 mg | ORAL_TABLET | Freq: Two times a day (BID) | ORAL | 3 refills | Status: DC

## 2020-01-22 ENCOUNTER — Ambulatory Visit (INDEPENDENT_AMBULATORY_CARE_PROVIDER_SITE_OTHER): Payer: Managed Care, Other (non HMO)

## 2020-01-22 ENCOUNTER — Other Ambulatory Visit: Payer: Self-pay

## 2020-01-22 DIAGNOSIS — Z23 Encounter for immunization: Secondary | ICD-10-CM

## 2020-01-22 NOTE — Progress Notes (Addendum)
Patient presented for HEP B injection to left deltoid, patient voiced no concerns nor showed any signs of distress during injection.  Reviewed Dr Nicki Reaper

## 2020-02-01 ENCOUNTER — Other Ambulatory Visit: Payer: Self-pay | Admitting: Internal Medicine

## 2020-02-28 NOTE — Progress Notes (Signed)
[place]  Telephone:([phone]) [phone] [phone]) [phone]  ID: Carol Mahoney OB: [birth year]  MR#: [id number]  BWL#:[id number]  Patient Care Team: Einar Pheasant, MD as PCP - General (Unknown Physician Specialty) Lloyd Huger, MD as Consulting Physician (Hematology and Oncology) Lloyd Huger, MD as Consulting Physician (Hematology and Oncology)  I connected with Carol Mahoney on 03/04/20 at  2:00 PM EDT by video enabled telemedicine visit and verified that I am speaking with the correct person using two identifiers.   I discussed the limitations, risks, security and privacy concerns of performing an evaluation and management service by telemedicine and the availability of in-person appointments. I also discussed with the patient that there may be a patient responsible charge related to this service. The patient expressed understanding and agreed to proceed.   Other persons participating in the visit and their role in the encounter: Patient, MD.  Patient's location: Home. Provider's location: Clinic.  CHIEF COMPLAINT:  Iron deficiency anemia refractory to oral iron therapy.  INTERVAL HISTORY: Patient agreed to video assisted telemedicine visit for further evaluation and discussion of her laboratory results.  She continues to feel well and remains asymptomatic.  She does not complain of any weakness or fatigue.  She has no neurologic complaints. She denies any recent fevers or illnesses. She has a good appetite and denies weight loss.  She denies any chest pain, shortness of breath, cough, or hemoptysis.  She denies any nausea, vomiting, constipation, or diarrhea. She denies any melena or hematochezia. She has no urinary complaints.  Patient offers no specific complaints today.  REVIEW OF SYSTEMS:   Review of Systems  Constitutional: Negative.  Negative for fever, malaise/fatigue and weight loss.  Respiratory: Negative.  Negative for cough and shortness of  breath.   Cardiovascular: Negative.  Negative for chest pain and leg swelling.  Gastrointestinal: Negative.  Negative for abdominal pain, blood in stool and melena.  Genitourinary: Negative.  Negative for hematuria.  Musculoskeletal: Negative.  Negative for back pain.  Skin: Negative.  Negative for rash.  Neurological: Negative.  Negative for dizziness, focal weakness, weakness and headaches.  Psychiatric/Behavioral: Negative.  The patient is not nervous/anxious.    As per HPI. Otherwise, a complete review of systems is negative.  PAST MEDICAL HISTORY: Past Medical History:  Diagnosis Date  . Anemia   . Anxiety   . Depression   . GERD (gastroesophageal reflux disease)   . H/O hiatal hernia   . Hypertension   . Hypertr obst cardiomyop    mitral regurgitation, LAE - followed at [facility]  . Hypothyroidism   . Migraine headache   . PONV (postoperative nausea and vomiting)     PAST SURGICAL HISTORY: Past Surgical History:  Procedure Laterality Date  . BUNIONECTOMY    . CHOLECYSTECTOMY    . HIATAL HERNIA REPAIR    . IMPLANTABLE CARDIOVERTER DEFIBRILLATOR REVISION  05/21/14   [facility]  . PARTIAL GASTRECTOMY    . TONSILLECTOMY      FAMILY HISTORY: Family History  Problem Relation Age of Onset  . Anesthesia problems Father   . Hypertension Mother   . Hashimoto's thyroiditis Maternal Grandmother   . Hypertension Maternal Grandmother   . Melanoma Maternal Grandmother   . Breast cancer Neg Hx   . Colon cancer Neg Hx     ADVANCED DIRECTIVES (Y/N):  N  HEALTH MAINTENANCE: Social History   Tobacco Use  . Smoking status: Never Smoker  . Smokeless tobacco: Never Used  Vaping Use  .  Vaping Use: Never used  Substance Use Topics  . Alcohol use: No    Alcohol/week: 0.0 standard drinks  . Drug use: No     Colonoscopy:  PAP:  Bone density:  Lipid panel:  Allergies  Allergen Reactions  . Carafate [Sucralfate] Rash and Hives  . Chloraprep One Step [Chlorhexidine Gluconate]  Itching and Hives  . Shellfish-Derived Products Swelling    Other reaction(s): Angioedema Esophageal and lip swelling  . Vancomycin Itching and Dermatitis  . Penicillins Hives and Rash  . Sulfa Antibiotics Hives and Rash  . Soap   . Trimethoprim     Other reaction(s): Unknown  . Iodine Rash    Current Outpatient Medications  Medication Sig Dispense Refill  . alosetron (LOTRONEX) 1 MG tablet Take 1 tablet (1 mg total) by mouth 2 (two) times daily. 180 tablet 3  . ALPRAZolam (XANAX) 0.5 MG tablet 0.5 mg 3 (three) times daily as needed.     . Cholecalciferol 25 MCG (1000 UT) capsule Take 1,000 Units by mouth daily.    . clobetasol cream (TEMOVATE) 0.05 % Apply to affected area on colored side of ttoo on left forearm once to twice daily for itch. Avoid Face, groin, and underarm 30 g 2  . cyclobenzaprine (FLEXERIL) 5 MG tablet Take 1 tablet (5 mg total) by mouth at bedtime as needed for muscle spasms. 30 tablet 0  . diphenoxylate-atropine (LOMOTIL) 2.5-0.025 MG tablet Take 1 tablet by mouth 4 (four) times daily as needed for diarrhea or loose stools. 120 tablet 5  . EPINEPHrine (EPIPEN 2-PAK) 0.3 mg/0.3 mL IJ SOAJ injection USE AS DIRECTED ON PACKAGE 2 Device 1  . furosemide (LASIX) 20 MG tablet Take 20 mg by mouth daily as needed.     Marland Kitchen levothyroxine (SYNTHROID) 88 MCG tablet TAKE 1 TABLET(88 MCG) BY MOUTH DAILY BEFORE BREAKFAST 90 tablet 0  . ondansetron (ZOFRAN) 4 MG tablet Take 1 tablet (4 mg total) by mouth 2 (two) times daily as needed for nausea or vomiting. 20 tablet 0  . pantoprazole (PROTONIX) 40 MG tablet TAKE 1 TABLET BY MOUTH  DAILY 90 tablet 3  . SOOLANTRA 1 % CREA Apply to face qhs    . SPRAVATO, 56 MG DOSE, 28 MG/DEVICE SOPK     . SUMAtriptan (IMITREX) 50 MG tablet Take 1 tablet by mouth  every 2 hours as needed for migraine(s) 54 tablet 2  . tacrolimus (PROTOPIC) 0.1 % ointment     . traZODone (DESYREL) 50 MG tablet 50 mg. Takes 1/2 tablet as needed at bedtime    .  TRINTELLIX 20 MG TABS tablet TK 1 T PO QAM    . vitamin B-12 (CYANOCOBALAMIN) 1000 MCG tablet Take 1 tablet (1,000 mcg total) by mouth daily. 30 tablet 3  . hydrocortisone (ANUSOL-HC) 25 MG suppository Place 1 suppository (25 mg total) rectally at bedtime. (Patient not taking: Reported on 12/12/2019) 12 suppository 0   No current facility-administered medications for this visit.    OBJECTIVE: There were no vitals filed for this visit.   There is no height or weight on file to calculate BMI.    ECOG FS:0 - Asymptomatic  General: Well-developed, well-nourished, no acute distress. HEENT: Normocephalic. Neuro: Alert, answering all questions appropriately. Cranial nerves grossly intact. Psych: Normal affect.  LAB RESULTS:  Lab Results  Component Value Date   NA 142 09/12/2019   K 4.3 09/12/2019   CL 102 09/12/2019   CO2 25 09/12/2019   GLUCOSE 72 09/12/2019  BUN 11 09/12/2019   CREATININE 0.86 09/12/2019   CALCIUM 9.6 09/12/2019   PROT 6.1 11/21/2019   ALBUMIN 3.9 11/21/2019   AST 36 11/21/2019   ALT 42 (H) 11/21/2019   ALKPHOS 92 11/21/2019   BILITOT 0.7 11/21/2019   GFRNONAA 81 09/12/2019   GFRAA 94 09/12/2019    Lab Results  Component Value Date   WBC 3.8 (L) 03/03/2020   NEUTROABS 2.0 03/03/2020   HGB 13.2 03/03/2020   HCT 37.8 03/03/2020   MCV 86.1 03/03/2020   PLT 142 (L) 03/03/2020   Lab Results  Component Value Date   IRON 93 03/03/2020   TIBC 322 03/03/2020   IRONPCTSAT 29 03/03/2020   Lab Results  Component Value Date   FERRITIN 35 03/03/2020     STUDIES: No results found.  ASSESSMENT: Iron deficiency anemia refractory to oral iron therapy  PLAN:   1.  Iron deficiency anemia refractory to oral iron therapy: Resolved.  Likely secondary to poor absorption given her history of partial gastrectomy.  Patient's hemoglobin and iron stores continue to be within normal limits.  She does not require additional IV Feraheme.  She last received treatment on  April 11, 2019.  After discussion with the patient, it was agreed upon that no further follow-up is necessary.  Please continue to monitor hemoglobin and iron stores 1 or 2 times per year and refer patient back if there are any questions or concerns.    I provided 20 minutes of face-to-face video visit time during this encounter which included chart review, counseling, and coordination of care as documented above.   Patient expressed understanding and was in agreement with this plan. She also understands that She can call clinic at any time with any questions, concerns, or complaints.    Lloyd Huger, MD   03/04/2020 2:18 PM

## 2020-03-03 ENCOUNTER — Inpatient Hospital Stay: Payer: Managed Care, Other (non HMO) | Attending: Oncology

## 2020-03-03 ENCOUNTER — Other Ambulatory Visit: Payer: Self-pay

## 2020-03-03 DIAGNOSIS — Z79899 Other long term (current) drug therapy: Secondary | ICD-10-CM | POA: Insufficient documentation

## 2020-03-03 DIAGNOSIS — Z882 Allergy status to sulfonamides status: Secondary | ICD-10-CM | POA: Insufficient documentation

## 2020-03-03 DIAGNOSIS — I10 Essential (primary) hypertension: Secondary | ICD-10-CM | POA: Insufficient documentation

## 2020-03-03 DIAGNOSIS — Z8249 Family history of ischemic heart disease and other diseases of the circulatory system: Secondary | ICD-10-CM | POA: Diagnosis not present

## 2020-03-03 DIAGNOSIS — Z8349 Family history of other endocrine, nutritional and metabolic diseases: Secondary | ICD-10-CM | POA: Diagnosis not present

## 2020-03-03 DIAGNOSIS — Z881 Allergy status to other antibiotic agents status: Secondary | ICD-10-CM | POA: Diagnosis not present

## 2020-03-03 DIAGNOSIS — Z880 Allergy status to penicillin: Secondary | ICD-10-CM | POA: Insufficient documentation

## 2020-03-03 DIAGNOSIS — F419 Anxiety disorder, unspecified: Secondary | ICD-10-CM | POA: Insufficient documentation

## 2020-03-03 DIAGNOSIS — D509 Iron deficiency anemia, unspecified: Secondary | ICD-10-CM | POA: Insufficient documentation

## 2020-03-03 DIAGNOSIS — D508 Other iron deficiency anemias: Secondary | ICD-10-CM

## 2020-03-03 DIAGNOSIS — K219 Gastro-esophageal reflux disease without esophagitis: Secondary | ICD-10-CM | POA: Insufficient documentation

## 2020-03-03 DIAGNOSIS — F329 Major depressive disorder, single episode, unspecified: Secondary | ICD-10-CM | POA: Insufficient documentation

## 2020-03-03 DIAGNOSIS — Z888 Allergy status to other drugs, medicaments and biological substances status: Secondary | ICD-10-CM | POA: Insufficient documentation

## 2020-03-03 DIAGNOSIS — Z9049 Acquired absence of other specified parts of digestive tract: Secondary | ICD-10-CM | POA: Insufficient documentation

## 2020-03-03 DIAGNOSIS — E039 Hypothyroidism, unspecified: Secondary | ICD-10-CM | POA: Diagnosis not present

## 2020-03-03 LAB — CBC WITH DIFFERENTIAL/PLATELET
Abs Immature Granulocytes: 0.01 10*3/uL (ref 0.00–0.07)
Basophils Absolute: 0.1 10*3/uL (ref 0.0–0.1)
Basophils Relative: 2 %
Eosinophils Absolute: 0.2 10*3/uL (ref 0.0–0.5)
Eosinophils Relative: 5 %
HCT: 37.8 % (ref 36.0–46.0)
Hemoglobin: 13.2 g/dL (ref 12.0–15.0)
Immature Granulocytes: 0 %
Lymphocytes Relative: 31 %
Lymphs Abs: 1.2 10*3/uL (ref 0.7–4.0)
MCH: 30.1 pg (ref 26.0–34.0)
MCHC: 34.9 g/dL (ref 30.0–36.0)
MCV: 86.1 fL (ref 80.0–100.0)
Monocytes Absolute: 0.3 10*3/uL (ref 0.1–1.0)
Monocytes Relative: 9 %
Neutro Abs: 2 10*3/uL (ref 1.7–7.7)
Neutrophils Relative %: 53 %
Platelets: 142 10*3/uL — ABNORMAL LOW (ref 150–400)
RBC: 4.39 MIL/uL (ref 3.87–5.11)
RDW: 12.9 % (ref 11.5–15.5)
WBC: 3.8 10*3/uL — ABNORMAL LOW (ref 4.0–10.5)
nRBC: 0 % (ref 0.0–0.2)

## 2020-03-03 LAB — IRON AND TIBC
Iron: 93 ug/dL (ref 28–170)
Saturation Ratios: 29 % (ref 10.4–31.8)
TIBC: 322 ug/dL (ref 250–450)
UIBC: 229 ug/dL

## 2020-03-03 LAB — FERRITIN: Ferritin: 35 ng/mL (ref 11–307)

## 2020-03-04 ENCOUNTER — Other Ambulatory Visit: Payer: Self-pay

## 2020-03-04 ENCOUNTER — Encounter: Payer: Self-pay | Admitting: Oncology

## 2020-03-04 ENCOUNTER — Inpatient Hospital Stay (HOSPITAL_BASED_OUTPATIENT_CLINIC_OR_DEPARTMENT_OTHER): Payer: Managed Care, Other (non HMO) | Admitting: Oncology

## 2020-03-04 DIAGNOSIS — D508 Other iron deficiency anemias: Secondary | ICD-10-CM

## 2020-04-01 ENCOUNTER — Other Ambulatory Visit: Payer: Self-pay

## 2020-04-01 ENCOUNTER — Ambulatory Visit (INDEPENDENT_AMBULATORY_CARE_PROVIDER_SITE_OTHER): Payer: Managed Care, Other (non HMO) | Admitting: Internal Medicine

## 2020-04-01 VITALS — BP 100/66 | HR 69 | Temp 98.2°F | Resp 16 | Ht 67.0 in | Wt 140.8 lb

## 2020-04-01 DIAGNOSIS — I421 Obstructive hypertrophic cardiomyopathy: Secondary | ICD-10-CM

## 2020-04-01 DIAGNOSIS — Z95810 Presence of automatic (implantable) cardiac defibrillator: Secondary | ICD-10-CM

## 2020-04-01 DIAGNOSIS — Z23 Encounter for immunization: Secondary | ICD-10-CM | POA: Diagnosis not present

## 2020-04-01 DIAGNOSIS — K219 Gastro-esophageal reflux disease without esophagitis: Secondary | ICD-10-CM | POA: Diagnosis not present

## 2020-04-01 DIAGNOSIS — E039 Hypothyroidism, unspecified: Secondary | ICD-10-CM

## 2020-04-01 DIAGNOSIS — I959 Hypotension, unspecified: Secondary | ICD-10-CM

## 2020-04-01 DIAGNOSIS — F341 Dysthymic disorder: Secondary | ICD-10-CM | POA: Diagnosis not present

## 2020-04-01 DIAGNOSIS — D508 Other iron deficiency anemias: Secondary | ICD-10-CM

## 2020-04-01 DIAGNOSIS — K580 Irritable bowel syndrome with diarrhea: Secondary | ICD-10-CM

## 2020-04-01 MED ORDER — EPINEPHRINE 0.3 MG/0.3ML IJ SOAJ: INTRAMUSCULAR | 0 refills | Status: DC

## 2020-04-01 MED ORDER — CYCLOBENZAPRINE HCL 5 MG PO TABS: 5.0000 mg | ORAL_TABLET | Freq: Every evening | ORAL | 0 refills | Status: DC | PRN

## 2020-04-01 NOTE — Progress Notes (Signed)
Patient ID: Carol Mahoney, female   DOB: 1976/05/23, 44 y.o.   MRN: [id number]   Subjective:    Patient ID: Carol Mahoney, female    DOB: June 16, 1975, 44 y.o.   MRN: [id number]  HPI This visit occurred during the SARS-CoV-2 public health emergency.  Safety protocols were in place, including screening questions prior to the visit, additional usage of staff PPE, and extensive cleaning of exam room while observing appropriate contact time as indicated for disinfecting solutions.  Patient here for a scheduled follow up.  Persistent increased stress. Family stress and working.  Discussed with her today.  Seeing psychiatry.  Does not feel needs any further intervention.  No chest pain reported.  Breathing stable.  Eating.  Bowels stable.  Saw Dr Grayland Ormond 03/04/20.  Stable.  Planning to travel to [place] next week.  Mother was diagnosed with covid last week. States she has not been around her.  No fever, no chest congestion, no cough, chest pain or chest tightness.    Past Medical History:  Diagnosis Date  . Anemia   . Anxiety   . Depression   . GERD (gastroesophageal reflux disease)   . H/O hiatal hernia   . Hypertension   . Hypertr obst cardiomyop    mitral regurgitation, LAE - followed at [facility]  . Hypothyroidism   . Migraine headache   . PONV (postoperative nausea and vomiting)    Past Surgical History:  Procedure Laterality Date  . BUNIONECTOMY    . CHOLECYSTECTOMY    . HIATAL HERNIA REPAIR    . IMPLANTABLE CARDIOVERTER DEFIBRILLATOR REVISION  05/21/14   [facility]  . PARTIAL GASTRECTOMY    . TONSILLECTOMY     Family History  Problem Relation Age of Onset  . Anesthesia problems Father   . Hypertension Mother   . Hashimoto's thyroiditis Maternal Grandmother   . Hypertension Maternal Grandmother   . Melanoma Maternal Grandmother   . Breast cancer Neg Hx   . Colon cancer Neg Hx    Social History   Socioeconomic History  . Marital status: Single    Spouse name: Not on file  . Number  of children: 1  . Years of education: Not on file  . Highest education level: Not on file  Occupational History  . Occupation: PROGRAM Printmaker: [employer]  Tobacco Use  . Smoking status: Never Smoker  . Smokeless tobacco: Never Used  Vaping Use  . Vaping Use: Never used  Substance and Sexual Activity  . Alcohol use: No    Alcohol/week: 0.0 standard drinks  . Drug use: No  . Sexual activity: Not Currently  Other Topics Concern  . Not on file  Social History Narrative  . Not on file   Social Determinants of Health   Financial Resource Strain:   . Difficulty of Paying Living Expenses: Not on file  Food Insecurity:   . Worried About Charity fundraiser in the Last Year: Not on file  . Ran Out of Food in the Last Year: Not on file  Transportation Needs:   . Lack of Transportation (Medical): Not on file  . Lack of Transportation (Non-Medical): Not on file  Physical Activity:   . Days of Exercise per Week: Not on file  . Minutes of Exercise per Session: Not on file  Stress:   . Feeling of Stress : Not on file  Social Connections:   . Frequency of Communication with Friends and Family: Not on file  .  Frequency of Social Gatherings with Friends and Family: Not on file  . Attends Religious Services: Not on file  . Active Member of Clubs or Organizations: Not on file  . Attends Archivist Meetings: Not on file  . Marital Status: Not on file    Outpatient Encounter Medications as of 04/01/2020  Medication Sig  . alosetron (LOTRONEX) 1 MG tablet Take 1 tablet (1 mg total) by mouth 2 (two) times daily.  Marland Kitchen ALPRAZolam (XANAX) 0.5 MG tablet 0.5 mg 3 (three) times daily as needed.   . Cholecalciferol 25 MCG (1000 UT) capsule Take 1,000 Units by mouth daily.  . clobetasol cream (TEMOVATE) 0.05 % Apply to affected area on colored side of ttoo on left forearm once to twice daily for itch. Avoid Face, groin, and underarm  . cyclobenzaprine (FLEXERIL) 5 MG tablet Take 1  tablet (5 mg total) by mouth at bedtime as needed for muscle spasms.  . diphenoxylate-atropine (LOMOTIL) 2.5-0.025 MG tablet Take 1 tablet by mouth 4 (four) times daily as needed for diarrhea or loose stools.  Marland Kitchen EPINEPHrine (EPIPEN 2-PAK) 0.3 mg/0.3 mL IJ SOAJ injection USE AS DIRECTED ON PACKAGE  . furosemide (LASIX) 20 MG tablet Take 20 mg by mouth daily as needed.   . hydrocortisone (ANUSOL-HC) 25 MG suppository Place 1 suppository (25 mg total) rectally at bedtime. (Patient not taking: Reported on 12/12/2019)  . levothyroxine (SYNTHROID) 88 MCG tablet TAKE 1 TABLET(88 MCG) BY MOUTH DAILY BEFORE BREAKFAST  . ondansetron (ZOFRAN) 4 MG tablet Take 1 tablet (4 mg total) by mouth 2 (two) times daily as needed for nausea or vomiting.  . pantoprazole (PROTONIX) 40 MG tablet TAKE 1 TABLET BY MOUTH  DAILY  . SOOLANTRA 1 % CREA Apply to face qhs  . SUMAtriptan (IMITREX) 50 MG tablet Take 1 tablet by mouth  every 2 hours as needed for migraine(s)  . tacrolimus (PROTOPIC) 0.1 % ointment   . traZODone (DESYREL) 50 MG tablet 50 mg. Takes 1/2 tablet as needed at bedtime  . TRINTELLIX 20 MG TABS tablet TK 1 T PO QAM  . vitamin B-12 (CYANOCOBALAMIN) 1000 MCG tablet Take 1 tablet (1,000 mcg total) by mouth daily.  . [DISCONTINUED] cyclobenzaprine (FLEXERIL) 5 MG tablet Take 1 tablet (5 mg total) by mouth at bedtime as needed for muscle spasms.  . [DISCONTINUED] EPINEPHrine (EPIPEN 2-PAK) 0.3 mg/0.3 mL IJ SOAJ injection USE AS DIRECTED ON PACKAGE  . [DISCONTINUED] SPRAVATO, 56 MG DOSE, 28 MG/DEVICE SOPK    No facility-administered encounter medications on file as of 04/01/2020.    Review of Systems  Constitutional: Negative for appetite change and unexpected weight change.  HENT: Negative for congestion, postnasal drip, sinus pressure, sinus pain and sore throat.   Respiratory: Negative for cough, chest tightness and shortness of breath.   Cardiovascular: Negative for chest pain, palpitations and leg  swelling.  Gastrointestinal: Negative for abdominal pain, diarrhea, nausea and vomiting.  Genitourinary: Negative for difficulty urinating and dysuria.  Musculoskeletal: Negative for joint swelling and myalgias.  Skin: Negative for color change and rash.  Neurological: Negative for dizziness, light-headedness and headaches.  Psychiatric/Behavioral: Negative for agitation and dysphoric mood.       Objective:    Physical Exam Vitals reviewed.  Constitutional:      General: She is not in acute distress.    Appearance: Normal appearance.  HENT:     Head: Normocephalic and atraumatic.     Right Ear: External ear normal.     Left  Ear: External ear normal.  Eyes:     General: No scleral icterus.       Right eye: No discharge.        Left eye: No discharge.     Conjunctiva/sclera: Conjunctivae normal.  Neck:     Thyroid: No thyromegaly.  Cardiovascular:     Rate and Rhythm: Normal rate and regular rhythm.  Pulmonary:     Effort: No respiratory distress.     Breath sounds: Normal breath sounds. No wheezing.  Abdominal:     General: Bowel sounds are normal.     Palpations: Abdomen is soft.     Tenderness: There is no abdominal tenderness.  Musculoskeletal:        General: No swelling or tenderness.     Cervical back: Neck supple.  Lymphadenopathy:     Cervical: No cervical adenopathy.  Skin:    Findings: No erythema or rash.  Neurological:     Mental Status: She is alert.  Psychiatric:        Mood and Affect: Mood normal.        Behavior: Behavior normal.     BP 100/66   Pulse 69   Temp 98.2 F (36.8 C) (Oral)   Resp 16   Ht 5\' 7"  (1.702 m)   Wt 140 lb 12.8 oz (63.9 kg)   LMP 10/08/2011   SpO2 99%   BMI 22.05 kg/m  Wt Readings from Last 3 Encounters:  04/01/20 140 lb 12.8 oz (63.9 kg)  12/12/19 136 lb 2 oz (61.7 kg)  11/22/19 137 lb (62.1 kg)     Lab Results  Component Value Date   WBC 3.8 (L) 03/03/2020   HGB 13.2 03/03/2020   HCT 37.8 03/03/2020    PLT 142 (L) 03/03/2020   GLUCOSE 72 09/12/2019   CHOL 177 03/13/2019   TRIG 53 03/13/2019   HDL 60 03/13/2019   LDLCALC 107 (H) 03/13/2019   ALT 42 (H) 11/21/2019   AST 36 11/21/2019   NA 142 09/12/2019   K 4.3 09/12/2019   CL 102 09/12/2019   CREATININE 0.86 09/12/2019   BUN 11 09/12/2019   CO2 25 09/12/2019   TSH 1.600 10/03/2019   INR 1.0 09/26/2014   HGBA1C 4.9 12/12/2014    US PELVIC COMPLETE WITH TRANSVAGINAL  Result Date: 12/05/2019 CLINICAL DATA:  Initial evaluation for pelvic pressure. History of prior hysterectomy. EXAM: TRANSABDOMINAL AND TRANSVAGINAL ULTRASOUND OF PELVIS TECHNIQUE: Both transabdominal and transvaginal ultrasound examinations of the pelvis were performed. Transabdominal technique was performed for global imaging of the pelvis including uterus, ovaries, adnexal regions, and pelvic cul-de-sac. It was necessary to proceed with endovaginal exam following the transabdominal exam to visualize the pelvic structures. COMPARISON:  Prior CT from 03/06/2011. FINDINGS: Uterus Surgically absent.  No abnormality about the vaginal cuff. Endometrium Surgically absent. Right ovary Not visualized.  No adnexal mass. Left ovary Measurements: 1.7 x 1.5 x 1.2 cm = volume: 1.5 mL. Normal appearance/no adnexal mass. Other findings No abnormal free fluid. IMPRESSION: 1. Prior hysterectomy with non visualization of the right ovary. No pelvic or adnexal mass. No abnormal free fluid. 2. Normal left ovary. Electronically Signed   By: Jeannine Boga M.D.   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  Previously received iron infusions.  Last check 02/2020.  Stable.  Follow cbc and iron studies.        ICD (implantable cardioverter-defibrillator) in place    Has been followed by Dr Mina Marble.       IBS    Bowels stable.       Hypothyroidism    On  thyroid replacement.  Follow tsh.       Hypotension    Blood pressures doing better.  Not taking fluid pill now.  Follow.        Relevant Medications   EPINEPHrine (EPIPEN 2-PAK) 0.3 mg/0.3 mL IJ SOAJ injection   Hypertrophic obstructive cardiomyopathy (HCC)    S/p septal myomectomy.  S/p ICD placement.  Has been followed by Dr Mina Marble.  Stable.       Relevant Medications   EPINEPHrine (EPIPEN 2-PAK) 0.3 mg/0.3 mL IJ SOAJ injection   GERD (gastroesophageal reflux disease)    No upper symptoms reported.  On protonix.        ANXIETY DEPRESSION    Increased stress as outlined.  Followed by Dr Toy Care.  Does not feel needs any further intervention at this time.  Follow.         Other Visit Diagnoses    Need for immunization against influenza    -  Primary   Relevant Orders   Flu Vaccine QUAD 36+ mos IM (Completed)   Flu Vaccine QUAD 36+ mos IM (Completed)       Einar Pheasant, MD

## 2020-04-03 ENCOUNTER — Encounter: Payer: Self-pay | Admitting: Internal Medicine

## 2020-04-03 NOTE — Telephone Encounter (Signed)
Need to know specifics.  When was her mother diagnosed?  When was the last time she was around her mother?  Trying to determine when her exposure.  Will need to monitor symptoms?  When did she get her covid test?

## 2020-04-04 NOTE — Telephone Encounter (Signed)
Patient aware of below. Only symptom she has developed is loss of smell. She is going to send update next week

## 2020-04-04 NOTE — Telephone Encounter (Signed)
If still having no symptoms and not sure when she was exposed, will hold on monoclonal ab infusion.  Needs to keep [place] posted on how she is doing.  Please call and confirm doing ok.  Quarantine for 10 days.

## 2020-04-05 ENCOUNTER — Encounter: Payer: Self-pay | Admitting: Internal Medicine

## 2020-04-05 NOTE — Assessment & Plan Note (Signed)
Bowels stable.  

## 2020-04-05 NOTE — Assessment & Plan Note (Signed)
Has been followed by Dr Wang.  

## 2020-04-05 NOTE — Assessment & Plan Note (Signed)
Increased stress as outlined.  Followed by Dr Toy Care.  Does not feel needs any further intervention at this time.  Follow.

## 2020-04-05 NOTE — Assessment & Plan Note (Signed)
On thyroid replacement.  Follow tsh.  

## 2020-04-05 NOTE — Assessment & Plan Note (Signed)
No upper symptoms reported.  On protonix.   

## 2020-04-05 NOTE — Assessment & Plan Note (Signed)
Blood pressures doing better.  Not taking fluid pill now.  Follow.

## 2020-04-05 NOTE — Assessment & Plan Note (Signed)
S/p septal myomectomy.  S/p ICD placement.  Has been followed by Dr Wang.  Stable.   

## 2020-04-05 NOTE — Assessment & Plan Note (Signed)
Has been evaluated by hematology.  Previously received iron infusions.  Last check 02/2020.  Stable.  Follow cbc and iron studies.

## 2020-04-16 ENCOUNTER — Other Ambulatory Visit: Payer: Self-pay | Admitting: Internal Medicine

## 2020-04-21 ENCOUNTER — Encounter: Payer: Self-pay | Admitting: Internal Medicine

## 2020-06-05 ENCOUNTER — Other Ambulatory Visit: Payer: Self-pay

## 2020-06-05 DIAGNOSIS — R748 Abnormal levels of other serum enzymes: Secondary | ICD-10-CM

## 2020-06-23 ENCOUNTER — Other Ambulatory Visit: Payer: Self-pay

## 2020-06-23 DIAGNOSIS — R197 Diarrhea, unspecified: Secondary | ICD-10-CM

## 2020-06-23 MED ORDER — DIPHENOXYLATE-ATROPINE 2.5-0.025 MG PO TABS: 1.0000 | ORAL_TABLET | Freq: Four times a day (QID) | ORAL | 5 refills | Status: DC | PRN

## 2020-07-03 ENCOUNTER — Telehealth (INDEPENDENT_AMBULATORY_CARE_PROVIDER_SITE_OTHER): Payer: Managed Care, Other (non HMO) | Admitting: Internal Medicine

## 2020-07-03 ENCOUNTER — Encounter: Payer: Self-pay | Admitting: Internal Medicine

## 2020-07-03 VITALS — Wt 143.0 lb

## 2020-07-03 DIAGNOSIS — I421 Obstructive hypertrophic cardiomyopathy: Secondary | ICD-10-CM | POA: Diagnosis not present

## 2020-07-03 DIAGNOSIS — Z01419 Encounter for gynecological examination (general) (routine) without abnormal findings: Secondary | ICD-10-CM

## 2020-07-03 DIAGNOSIS — K219 Gastro-esophageal reflux disease without esophagitis: Secondary | ICD-10-CM | POA: Diagnosis not present

## 2020-07-03 DIAGNOSIS — D696 Thrombocytopenia, unspecified: Secondary | ICD-10-CM

## 2020-07-03 DIAGNOSIS — E039 Hypothyroidism, unspecified: Secondary | ICD-10-CM | POA: Diagnosis not present

## 2020-07-03 DIAGNOSIS — F341 Dysthymic disorder: Secondary | ICD-10-CM

## 2020-07-03 DIAGNOSIS — D508 Other iron deficiency anemias: Secondary | ICD-10-CM

## 2020-07-03 NOTE — Progress Notes (Signed)
Patient ID: Carol Mahoney, female   DOB: 1976-05-13, 45 y.o.   MRN: [id number]   Virtual Visit via video Note  This visit type was conducted due to national recommendations for restrictions regarding the COVID-19 pandemic (e.g. social distancing).  This format is felt to be most appropriate for this patient at this time.  All issues noted in this document were discussed and addressed.  No physical exam was performed (except for noted visual exam findings with Video Visits).   I connected with Carol Mahoney by a video enabled telemedicine application and verified that I am speaking with the correct person using two identifiers. Location patient: home Location provider: work  Persons participating in the virtual visit: patient, provider  The limitations, risks, security and privacy concerns of performing an evaluation and management service by video and the availability of in person appointments have been discussed.  It has also been discussed with the patient that there may be a patient responsible charge related to this service. The patient expressed understanding and agreed to proceed.   Reason for visit: follow up appt  HPI: Follow up regarding HOCM, hypothyroidism and stress.  Recently went to [place].  Nice get away for her.  Feeling better.  Discussed stress. Seeing psychiatry.  Breathing stable.  No chest pain or increased sob reported.  Has not needed lasix recently.  Bowels stable.  Back and hip - better.  Saw ortho.     ROS: See pertinent positives and negatives per HPI.  Past Medical History:  Diagnosis Date  . Anemia   . Anxiety   . Depression   . GERD (gastroesophageal reflux disease)   . H/O hiatal hernia   . Hypertension   . Hypertr obst cardiomyop    mitral regurgitation, LAE - followed at [facility]  . Hypothyroidism   . Migraine headache   . PONV (postoperative nausea and vomiting)     Past Surgical History:  Procedure Laterality Date  . BUNIONECTOMY    .  CHOLECYSTECTOMY    . HIATAL HERNIA REPAIR    . IMPLANTABLE CARDIOVERTER DEFIBRILLATOR REVISION  05/21/14   [facility]  . PARTIAL GASTRECTOMY    . TONSILLECTOMY      Family History  Problem Relation Age of Onset  . Anesthesia problems Father   . Hypertension Mother   . Hashimoto's thyroiditis Maternal Grandmother   . Hypertension Maternal Grandmother   . Melanoma Maternal Grandmother   . Breast cancer Neg Hx   . Colon cancer Neg Hx     SOCIAL HX: reviewed   Current Outpatient Medications:  .  alosetron (LOTRONEX) 1 MG tablet, Take 1 tablet (1 mg total) by mouth 2 (two) times daily., Disp: 180 tablet, Rfl: 3 .  ALPRAZolam (XANAX) 0.5 MG tablet, 0.5 mg 3 (three) times daily as needed. , Disp: , Rfl:  .  Cholecalciferol 25 MCG (1000 UT) capsule, Take 1,000 Units by mouth daily., Disp: , Rfl:  .  clobetasol cream (TEMOVATE) 0.05 %, Apply to affected area on colored side of ttoo on left forearm once to twice daily for itch. Avoid Face, groin, and underarm, Disp: 30 g, Rfl: 2 .  cyclobenzaprine (FLEXERIL) 5 MG tablet, Take 1 tablet (5 mg total) by mouth at bedtime as needed for muscle spasms., Disp: 30 tablet, Rfl: 0 .  diphenoxylate-atropine (LOMOTIL) 2.5-0.025 MG tablet, Take 1 tablet by mouth 4 (four) times daily as needed for diarrhea or loose stools., Disp: 120 tablet, Rfl: 5 .  EPINEPHrine (EPIPEN 2-PAK) 0.3  mg/0.3 mL IJ SOAJ injection, USE AS DIRECTED ON PACKAGE, Disp: 2 each, Rfl: 0 .  furosemide (LASIX) 20 MG tablet, Take 20 mg by mouth daily as needed. , Disp: , Rfl:  .  hydrocortisone (ANUSOL-HC) 25 MG suppository, Place 1 suppository (25 mg total) rectally at bedtime., Disp: 12 suppository, Rfl: 0 .  levothyroxine (SYNTHROID) 88 MCG tablet, TAKE 1 TABLET(88 MCG) BY MOUTH DAILY BEFORE BREAKFAST, Disp: 90 tablet, Rfl: 0 .  ondansetron (ZOFRAN) 4 MG tablet, Take 1 tablet (4 mg total) by mouth 2 (two) times daily as needed for nausea or vomiting., Disp: 20 tablet, Rfl: 0 .  pantoprazole  (PROTONIX) 40 MG tablet, TAKE 1 TABLET BY MOUTH  DAILY, Disp: 90 tablet, Rfl: 3 .  SOOLANTRA 1 % CREA, Apply to face qhs, Disp: , Rfl:  .  SUMAtriptan (IMITREX) 50 MG tablet, Take 1 tablet by mouth  every 2 hours as needed for migraine(s), Disp: 54 tablet, Rfl: 2 .  tacrolimus (PROTOPIC) 0.1 % ointment, , Disp: , Rfl:  .  traZODone (DESYREL) 50 MG tablet, 50 mg. Takes 1/2 tablet as needed at bedtime, Disp: , Rfl:  .  TRINTELLIX 20 MG TABS tablet, TK 1 T PO QAM, Disp: , Rfl:  .  vitamin B-12 (CYANOCOBALAMIN) 1000 MCG tablet, Take 1 tablet (1,000 mcg total) by mouth daily., Disp: 30 tablet, Rfl: 3  EXAM:  GENERAL: alert, oriented, appears well and in no acute distress  HEENT: atraumatic, conjunttiva clear, no obvious abnormalities on inspection of external nose and ears  NECK: normal movements of the head and neck  LUNGS: on inspection no signs of respiratory distress, breathing rate appears normal, no obvious gross SOB, gasping or wheezing  CV: no obvious cyanosis  PSYCH/NEURO: pleasant and cooperative, no obvious depression or anxiety, speech and thought processing grossly intact  ASSESSMENT AND PLAN:  Discussed the following assessment and plan:  Problem List Items Addressed This Visit    ANXIETY DEPRESSION    Followed by Carol Mahoney.  Currently doing better.  Follow.       GERD (gastroesophageal reflux disease)    On protonix.  No upper symptoms reported.        Hypertrophic obstructive cardiomyopathy (HCC)    S/p septal myomectomy.  S/p ICD placement.  Has been followed by Carol Mahoney.  Stable.        Hypothyroidism    On thyroid replacement.  Follow tsh.  Needs refill medication.  Check tsh to confirm no dose change necessary prior to refill.       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  Previously received iron infusions.  Recheck cbc and iron studies.        Thrombocytopenia ([place])    Follow cbc.        Other Visit  Diagnoses    Gynecologic exam normal    -  Primary       I discussed the assessment and treatment plan with the patient. The patient was provided an opportunity to ask questions and all were answered. The patient agreed with the plan and demonstrated an understanding of the instructions.   The patient was advised to call back or seek an in-person evaluation if the symptoms worsen or if the condition fails to improve as anticipated.    Einar Pheasant, MD

## 2020-07-04 ENCOUNTER — Telehealth: Payer: Self-pay | Admitting: Internal Medicine

## 2020-07-04 DIAGNOSIS — I421 Obstructive hypertrophic cardiomyopathy: Secondary | ICD-10-CM

## 2020-07-04 DIAGNOSIS — E039 Hypothyroidism, unspecified: Secondary | ICD-10-CM

## 2020-07-04 DIAGNOSIS — D508 Other iron deficiency anemias: Secondary | ICD-10-CM

## 2020-07-04 DIAGNOSIS — I10 Essential (primary) hypertension: Secondary | ICD-10-CM

## 2020-07-04 DIAGNOSIS — E559 Vitamin D deficiency, unspecified: Secondary | ICD-10-CM

## 2020-07-04 DIAGNOSIS — D696 Thrombocytopenia, unspecified: Secondary | ICD-10-CM

## 2020-07-04 DIAGNOSIS — E538 Deficiency of other specified B group vitamins: Secondary | ICD-10-CM

## 2020-07-04 NOTE — Telephone Encounter (Signed)
Orders placed for [facility] labs.   

## 2020-07-05 ENCOUNTER — Encounter: Payer: Self-pay | Admitting: Internal Medicine

## 2020-07-05 NOTE — Assessment & Plan Note (Signed)
Followed by Dr Toy Care.  Currently doing better.  Follow.

## 2020-07-05 NOTE — Assessment & Plan Note (Signed)
On protonix.  No upper symptoms reported.   

## 2020-07-05 NOTE — Assessment & Plan Note (Signed)
Has been evaluated by hematology.  Previously received iron infusions.  Recheck cbc and iron studies.

## 2020-07-05 NOTE — Assessment & Plan Note (Signed)
On thyroid replacement.  Follow tsh.  Needs refill medication.  Check tsh to confirm no dose change necessary prior to refill.

## 2020-07-05 NOTE — Assessment & Plan Note (Signed)
S/p septal myomectomy.  S/p ICD placement.  Has been followed by Dr Mina Marble.  Stable.

## 2020-07-05 NOTE — Assessment & Plan Note (Signed)
Follow cbc.  

## 2020-07-25 LAB — CBC WITH DIFFERENTIAL/PLATELET
Basophils Absolute: 0.1 10*3/uL (ref 0.0–0.2)
Basos: 2 %
EOS (ABSOLUTE): 0.2 10*3/uL (ref 0.0–0.4)
Eos: 5 %
Hematocrit: 39.6 % (ref 34.0–46.6)
Hemoglobin: 13.5 g/dL (ref 11.1–15.9)
Immature Grans (Abs): 0 10*3/uL (ref 0.0–0.1)
Immature Granulocytes: 0 %
Lymphocytes Absolute: 1.6 10*3/uL (ref 0.7–3.1)
Lymphs: 46 %
MCH: 29.2 pg (ref 26.6–33.0)
MCHC: 34.1 g/dL (ref 31.5–35.7)
MCV: 86 fL (ref 79–97)
Monocytes Absolute: 0.3 10*3/uL (ref 0.1–0.9)
Monocytes: 8 %
Neutrophils Absolute: 1.4 10*3/uL (ref 1.4–7.0)
Neutrophils: 39 %
Platelets: 138 10*3/uL — ABNORMAL LOW (ref 150–450)
RBC: 4.62 x10E6/uL (ref 3.77–5.28)
RDW: 12.7 % (ref 11.7–15.4)
WBC: 3.6 10*3/uL (ref 3.4–10.8)

## 2020-07-25 LAB — HEPATIC FUNCTION PANEL
ALT: 38 IU/L — ABNORMAL HIGH (ref 0–32)
AST: 33 IU/L (ref 0–40)
Albumin: 4.2 g/dL (ref 3.8–4.8)
Alkaline Phosphatase: 81 IU/L (ref 44–121)
Bilirubin Total: 0.5 mg/dL (ref 0.0–1.2)
Bilirubin, Direct: 0.15 mg/dL (ref 0.00–0.40)
Total Protein: 6.5 g/dL (ref 6.0–8.5)

## 2020-07-25 LAB — BASIC METABOLIC PANEL
BUN/Creatinine Ratio: 11 (ref 9–23)
BUN: 12 mg/dL (ref 6–24)
CO2: 25 mmol/L (ref 20–29)
Calcium: 9.2 mg/dL (ref 8.7–10.2)
Chloride: 104 mmol/L (ref 96–106)
Creatinine, Ser: 1.05 mg/dL — ABNORMAL HIGH (ref 0.57–1.00)
Glucose: 78 mg/dL (ref 65–99)
Potassium: 4.1 mmol/L (ref 3.5–5.2)
Sodium: 143 mmol/L (ref 134–144)
eGFR: 66 mL/min/{1.73_m2} (ref >59)

## 2020-07-25 LAB — IRON AND TIBC
Iron Saturation: 25 % (ref 15–55)
Iron: 68 ug/dL (ref 27–159)
Total Iron Binding Capacity: 276 ug/dL (ref 250–450)
UIBC: 208 ug/dL (ref 131–425)

## 2020-07-25 LAB — LIPID PANEL
Chol/HDL Ratio: 2.7 ratio (ref 0.0–4.4)
Cholesterol, Total: 175 mg/dL (ref 100–199)
HDL: 66 mg/dL (ref >39)
LDL Chol Calc (NIH): 97 mg/dL (ref 0–99)
Triglycerides: 61 mg/dL (ref 0–149)
VLDL Cholesterol Cal: 12 mg/dL (ref 5–40)

## 2020-07-25 LAB — FERRITIN: Ferritin: 61 ng/mL (ref 15–150)

## 2020-07-25 LAB — TSH: TSH: 6.36 u[IU]/mL — ABNORMAL HIGH (ref 0.450–4.500)

## 2020-07-25 LAB — VITAMIN D 25 HYDROXY (VIT D DEFICIENCY, FRACTURES): Vit D, 25-Hydroxy: 27.2 ng/mL — ABNORMAL LOW (ref 30.0–100.0)

## 2020-07-28 ENCOUNTER — Telehealth (INDEPENDENT_AMBULATORY_CARE_PROVIDER_SITE_OTHER): Payer: Managed Care, Other (non HMO) | Admitting: Internal Medicine

## 2020-07-28 ENCOUNTER — Encounter: Payer: Self-pay | Admitting: Internal Medicine

## 2020-07-28 DIAGNOSIS — E039 Hypothyroidism, unspecified: Secondary | ICD-10-CM

## 2020-07-28 DIAGNOSIS — D696 Thrombocytopenia, unspecified: Secondary | ICD-10-CM

## 2020-07-28 DIAGNOSIS — E559 Vitamin D deficiency, unspecified: Secondary | ICD-10-CM

## 2020-07-28 DIAGNOSIS — I421 Obstructive hypertrophic cardiomyopathy: Secondary | ICD-10-CM | POA: Diagnosis not present

## 2020-07-28 DIAGNOSIS — K580 Irritable bowel syndrome with diarrhea: Secondary | ICD-10-CM | POA: Diagnosis not present

## 2020-07-28 DIAGNOSIS — K863 Pseudocyst of pancreas: Secondary | ICD-10-CM | POA: Insufficient documentation

## 2020-07-28 MED ORDER — LEVOTHYROXINE SODIUM 100 MCG PO TABS: 100.0000 ug | ORAL_TABLET | Freq: Every day | ORAL | 1 refills | Status: DC

## 2020-07-28 NOTE — Progress Notes (Signed)
Patient ID: Carol Mahoney, female   DOB: 11-04-1975, 45 y.o.   MRN: [id number]   Virtual Visit via video Note  This visit type was conducted due to national recommendations for restrictions regarding the COVID-19 pandemic (e.g. social distancing).  This format is felt to be most appropriate for this patient at this time.  All issues noted in this document were discussed and addressed.  No physical exam was performed (except for noted visual exam findings with Video Visits).   I connected with Carol Mahoney by a video enabled telemedicine application and verified that I am speaking with the correct person using two identifiers. Location patient: home Location provider: work Persons participating in the virtual visit: patient, provider  The limitations, risks, security and privacy concerns of performing an evaluation and management service by video and the availability of in person appointments have been discussed. It has also been discussed with the patient that there may be a patient responsible charge related to this service. The patient expressed understanding and agreed to proceed.   Reason for visit: work in appt   HPI: Work in appt to discuss recent labs and follow up with GI.  Reviewed labs.  TSH suppressed.  Currently taking 51mcg q day synthroid.  Discussed increasing the dose to 118mcg q day.  Also, she has been off her vitamin D supplements.  She just restarted this weekend.  Taking 1035mcg vitamin D3 q day.  Discussed platelet count - slight decreased, but overall stable.  Recent ultrasound revealed a normal spleen.  Slightly increased ALT - stable.  Has seen Dr Allen Norris previously.  Note reviewed.  Ultrasound reviewed - presence of pancreatic pseudocyst.  Was supposed to have f/u CT.  Discussed f/u with GI to complete the work up.  She is agreeable.  No nausea or vomiting.  No abdominal pain.  Bowels stable.  No alcohol.  Doing well regarding her lower extremity swelling.  Not taking her fluid  pill.  Cr - 1.0.  Overall she feels things are stable.  Still with increased stress.     ROS: See pertinent positives and negatives per HPI.  Past Medical History:  Diagnosis Date  . Anemia   . Anxiety   . Depression   . GERD (gastroesophageal reflux disease)   . H/O hiatal hernia   . Hypertension   . Hypertr obst cardiomyop    mitral regurgitation, LAE - followed at [facility]  . Hypothyroidism   . Migraine headache   . PONV (postoperative nausea and vomiting)     Past Surgical History:  Procedure Laterality Date  . BUNIONECTOMY    . CHOLECYSTECTOMY    . HIATAL HERNIA REPAIR    . IMPLANTABLE CARDIOVERTER DEFIBRILLATOR REVISION  05/21/14   [facility]  . PARTIAL GASTRECTOMY    . TONSILLECTOMY      Family History  Problem Relation Age of Onset  . Anesthesia problems Father   . Hypertension Mother   . Hashimoto's thyroiditis Maternal Grandmother   . Hypertension Maternal Grandmother   . Melanoma Maternal Grandmother   . Breast cancer Neg Hx   . Colon cancer Neg Hx     SOCIAL HX: reviewed.    Current Outpatient Medications:  .  levothyroxine (SYNTHROID) 100 MCG tablet, Take 1 tablet (100 mcg total) by mouth daily before breakfast., Disp: 90 tablet, Rfl: 1 .  alosetron (LOTRONEX) 1 MG tablet, Take 1 tablet (1 mg total) by mouth 2 (two) times daily., Disp: 180 tablet, Rfl: 3 .  ALPRAZolam (  XANAX) 0.5 MG tablet, 0.5 mg 3 (three) times daily as needed. , Disp: , Rfl:  .  Cholecalciferol 25 MCG (1000 UT) capsule, Take 1,000 Units by mouth daily., Disp: , Rfl:  .  clobetasol cream (TEMOVATE) 0.05 %, Apply to affected area on colored side of ttoo on left forearm once to twice daily for itch. Avoid Face, groin, and underarm, Disp: 30 g, Rfl: 2 .  cyclobenzaprine (FLEXERIL) 5 MG tablet, Take 1 tablet (5 mg total) by mouth at bedtime as needed for muscle spasms., Disp: 30 tablet, Rfl: 0 .  diphenoxylate-atropine (LOMOTIL) 2.5-0.025 MG tablet, Take 1 tablet by mouth 4 (four) times daily as  needed for diarrhea or loose stools., Disp: 120 tablet, Rfl: 5 .  EPINEPHrine (EPIPEN 2-PAK) 0.3 mg/0.3 mL IJ SOAJ injection, USE AS DIRECTED ON PACKAGE, Disp: 2 each, Rfl: 0 .  furosemide (LASIX) 20 MG tablet, Take 20 mg by mouth daily as needed. , Disp: , Rfl:  .  hydrocortisone (ANUSOL-HC) 25 MG suppository, Place 1 suppository (25 mg total) rectally at bedtime., Disp: 12 suppository, Rfl: 0 .  ondansetron (ZOFRAN) 4 MG tablet, Take 1 tablet (4 mg total) by mouth 2 (two) times daily as needed for nausea or vomiting., Disp: 20 tablet, Rfl: 0 .  pantoprazole (PROTONIX) 40 MG tablet, TAKE 1 TABLET BY MOUTH  DAILY, Disp: 90 tablet, Rfl: 3 .  SOOLANTRA 1 % CREA, Apply to face qhs, Disp: , Rfl:  .  SUMAtriptan (IMITREX) 50 MG tablet, Take 1 tablet by mouth  every 2 hours as needed for migraine(s), Disp: 54 tablet, Rfl: 2 .  tacrolimus (PROTOPIC) 0.1 % ointment, , Disp: , Rfl:  .  traZODone (DESYREL) 50 MG tablet, 50 mg. Takes 1/2 tablet as needed at bedtime, Disp: , Rfl:  .  TRINTELLIX 20 MG TABS tablet, TK 1 T PO QAM, Disp: , Rfl:  .  vitamin B-12 (CYANOCOBALAMIN) 1000 MCG tablet, Take 1 tablet (1,000 mcg total) by mouth daily., Disp: 30 tablet, Rfl: 3  EXAM:  GENERAL: alert, oriented, appears well and in no acute distress  HEENT: atraumatic, conjunttiva clear, no obvious abnormalities on inspection of external nose and ears  NECK: normal movements of the head and neck  LUNGS: on inspection no signs of respiratory distress, breathing rate appears normal, no obvious gross SOB, gasping or wheezing  CV: no obvious cyanosis  PSYCH/NEURO: pleasant and cooperative, no obvious depression or anxiety, speech and thought processing grossly intact  ASSESSMENT AND PLAN:  Discussed the following assessment and plan:  Problem List Items Addressed This Visit    Hypertrophic obstructive cardiomyopathy ([place])    S/p septal myomectomy.  S/p ICD placement.  Stable.       Relevant Orders   Basic  metabolic panel   Hypothyroidism    Recent TSH elevated.  Currently taking 68mcg synthroid q day.  Increased to 122mcg q day.  Recheck tsh in 6-8 weeks.  She will get labs through [facility].       Relevant Medications   levothyroxine (SYNTHROID) 100 MCG tablet   Other Relevant Orders   TSH   IBS    Reports bowels are stable.       Pancreatic pseudocyst    Found on recent ultrasound.  Has seen Dr Allen Norris.  Had ordered CT.  Needs f/u.  Arrange.        Thrombocytopenia (HCC)    Slightly decreased platelet count.  Recheck cbc with next labs.  Relevant Orders   CBC with Differential/Platelet   Vitamin D deficiency    Vitamin D level slightly decreased.  Has been off her vitamin D supplements.  She has restarted vitamin D3 1000 units q day.  Will follow.           I discussed the assessment and treatment plan with the patient. The patient was provided an opportunity to ask questions and all were answered. The patient agreed with the plan and demonstrated an understanding of the instructions.   The patient was advised to call back or seek an in-person evaluation if the symptoms worsen or if the condition fails to improve as anticipated.   Einar Pheasant, MD

## 2020-07-28 NOTE — Assessment & Plan Note (Signed)
Reports bowels are stable.

## 2020-07-28 NOTE — Assessment & Plan Note (Signed)
S/p septal myomectomy.  S/p ICD placement.  Stable.  

## 2020-07-28 NOTE — Assessment & Plan Note (Signed)
Slightly decreased platelet count.  Recheck cbc with next labs.

## 2020-07-28 NOTE — Assessment & Plan Note (Signed)
Found on recent ultrasound.  Has seen Dr Allen Norris.  Had ordered CT.  Needs f/u.  Arrange.

## 2020-07-28 NOTE — Assessment & Plan Note (Signed)
Recent TSH elevated.  Currently taking 28mcg synthroid q day.  Increased to 182mcg q day.  Recheck tsh in 6-8 weeks.  She will get labs through [facility].

## 2020-07-28 NOTE — Assessment & Plan Note (Signed)
Vitamin D level slightly decreased.  Has been off her vitamin D supplements.  She has restarted vitamin D3 1000 units q day.  Will follow.

## 2020-09-18 ENCOUNTER — Other Ambulatory Visit: Payer: Self-pay | Admitting: Internal Medicine

## 2020-10-29 LAB — CBC WITH DIFFERENTIAL/PLATELET
Basophils Absolute: 0.1 10*3/uL (ref 0.0–0.2)
Basos: 2 %
EOS (ABSOLUTE): 0.1 10*3/uL (ref 0.0–0.4)
Eos: 3 %
Hematocrit: 38.7 % (ref 34.0–46.6)
Hemoglobin: 13.2 g/dL (ref 11.1–15.9)
Immature Grans (Abs): 0 10*3/uL (ref 0.0–0.1)
Immature Granulocytes: 0 %
Lymphocytes Absolute: 1.5 10*3/uL (ref 0.7–3.1)
Lymphs: 38 %
MCH: 29.3 pg (ref 26.6–33.0)
MCHC: 34.1 g/dL (ref 31.5–35.7)
MCV: 86 fL (ref 79–97)
Monocytes Absolute: 0.3 10*3/uL (ref 0.1–0.9)
Monocytes: 7 %
Neutrophils Absolute: 2 10*3/uL (ref 1.4–7.0)
Neutrophils: 50 %
Platelets: 166 10*3/uL (ref 150–450)
RBC: 4.5 x10E6/uL (ref 3.77–5.28)
RDW: 12.4 % (ref 11.7–15.4)
WBC: 4 10*3/uL (ref 3.4–10.8)

## 2020-10-29 LAB — BASIC METABOLIC PANEL
BUN/Creatinine Ratio: 12 (ref 9–23)
BUN: 10 mg/dL (ref 6–24)
CO2: 26 mmol/L (ref 20–29)
Calcium: 9 mg/dL (ref 8.7–10.2)
Chloride: 103 mmol/L (ref 96–106)
Creatinine, Ser: 0.82 mg/dL (ref 0.57–1.00)
Glucose: 102 mg/dL — ABNORMAL HIGH (ref 65–99)
Potassium: 3.9 mmol/L (ref 3.5–5.2)
Sodium: 140 mmol/L (ref 134–144)
eGFR: 89 mL/min/{1.73_m2} (ref >59)

## 2020-10-29 LAB — TSH: TSH: 0.642 u[IU]/mL (ref 0.450–4.500)

## 2020-10-31 ENCOUNTER — Encounter: Payer: Self-pay | Admitting: Internal Medicine

## 2020-10-31 ENCOUNTER — Other Ambulatory Visit: Payer: Self-pay

## 2020-10-31 MED ORDER — FUROSEMIDE 20 MG PO TABS: ORAL_TABLET | ORAL | 0 refills | Status: DC

## 2020-10-31 NOTE — Telephone Encounter (Signed)
It is fine to send in a short term refill.

## 2020-10-31 NOTE — Telephone Encounter (Signed)
Confirmed no SOB, change in breathing, etc. Swelling is localized in the lower leg bilateral. Her grandson had surgery on Wednesday so she has been at the hospital since then and has noticed that she is retaining fluid. She is requesting to have a few furosemide 10 mg to use. She has not had to use them for over a year but she has been up on her feet and in her shoes more than normal the last few days and her grandson has not been discharged yet. She has some of her last script left but they are expired. Ok to send in for her?

## 2020-11-26 ENCOUNTER — Other Ambulatory Visit: Payer: Self-pay

## 2020-11-26 DIAGNOSIS — K859 Acute pancreatitis without necrosis or infection, unspecified: Secondary | ICD-10-CM

## 2020-12-02 ENCOUNTER — Ambulatory Visit
Admission: RE | Admit: 2020-12-02 | Discharge: 2020-12-02 | Disposition: A | Discharge status: Home / Self Care | Payer: Managed Care, Other (non HMO) | Source: Ambulatory Visit | Attending: Gastroenterology | Admitting: Gastroenterology

## 2020-12-02 ENCOUNTER — Other Ambulatory Visit: Payer: Self-pay

## 2020-12-02 DIAGNOSIS — K859 Acute pancreatitis without necrosis or infection, unspecified: Secondary | ICD-10-CM | POA: Insufficient documentation

## 2020-12-08 ENCOUNTER — Telehealth: Payer: Self-pay

## 2020-12-08 NOTE — Telephone Encounter (Signed)
-----   Message from Jonathon Bellows, MD sent at 12/08/2020 12:31 PM EDT ----- 1. Some inflammation in the jejunum and small bowel . Ensure not on any NSAID's . Enquire if having any abdominal pain/diarrhea and check if on PPI  2. Appears hiatal hernia wrap has come partially undone- she should probably have a discussion with her surgeon about that

## 2020-12-08 NOTE — Telephone Encounter (Signed)
Pt notified of CT scan results through mychart.

## 2020-12-08 NOTE — Progress Notes (Signed)
1. Some inflammation in the jejunum and small bowel . Ensure not on any NSAID's . Enquire if having any abdominal pain/diarrhea and check if on PPI  2. Appears hiatal hernia wrap has come partially undone- she should probably have a discussion with her surgeon about that

## 2020-12-22 ENCOUNTER — Encounter: Payer: Self-pay | Admitting: Oncology

## 2020-12-23 ENCOUNTER — Other Ambulatory Visit: Payer: Self-pay | Admitting: Family Medicine

## 2020-12-24 ENCOUNTER — Other Ambulatory Visit: Payer: Self-pay | Admitting: Internal Medicine

## 2021-01-06 ENCOUNTER — Other Ambulatory Visit: Payer: Self-pay | Admitting: Internal Medicine

## 2021-03-05 ENCOUNTER — Ambulatory Visit (INDEPENDENT_AMBULATORY_CARE_PROVIDER_SITE_OTHER): Payer: Managed Care, Other (non HMO) | Admitting: Internal Medicine

## 2021-03-05 ENCOUNTER — Other Ambulatory Visit: Payer: Self-pay

## 2021-03-05 VITALS — BP 102/70 | HR 65 | Temp 98.1°F | Resp 16 | Ht 67.0 in | Wt 145.0 lb

## 2021-03-05 DIAGNOSIS — R197 Diarrhea, unspecified: Secondary | ICD-10-CM

## 2021-03-05 DIAGNOSIS — I959 Hypotension, unspecified: Secondary | ICD-10-CM | POA: Diagnosis not present

## 2021-03-05 DIAGNOSIS — K219 Gastro-esophageal reflux disease without esophagitis: Secondary | ICD-10-CM

## 2021-03-05 DIAGNOSIS — D508 Other iron deficiency anemias: Secondary | ICD-10-CM

## 2021-03-05 DIAGNOSIS — Z1231 Encounter for screening mammogram for malignant neoplasm of breast: Secondary | ICD-10-CM

## 2021-03-05 DIAGNOSIS — E039 Hypothyroidism, unspecified: Secondary | ICD-10-CM

## 2021-03-05 DIAGNOSIS — R935 Abnormal findings on diagnostic imaging of other abdominal regions, including retroperitoneum: Secondary | ICD-10-CM

## 2021-03-05 DIAGNOSIS — F341 Dysthymic disorder: Secondary | ICD-10-CM

## 2021-03-05 DIAGNOSIS — I421 Obstructive hypertrophic cardiomyopathy: Secondary | ICD-10-CM | POA: Diagnosis not present

## 2021-03-05 DIAGNOSIS — G43809 Other migraine, not intractable, without status migrainosus: Secondary | ICD-10-CM

## 2021-03-05 DIAGNOSIS — K863 Pseudocyst of pancreas: Secondary | ICD-10-CM

## 2021-03-05 DIAGNOSIS — E559 Vitamin D deficiency, unspecified: Secondary | ICD-10-CM

## 2021-03-05 MED ORDER — LEVOTHYROXINE SODIUM 100 MCG PO TABS: 100.0000 ug | ORAL_TABLET | Freq: Every day | ORAL | 1 refills | Status: DC

## 2021-03-05 NOTE — Progress Notes (Signed)
Patient ID: Carol Mahoney, female   DOB: 05-Mar-1976, 45 y.o.   MRN: [id number]   Subjective:    Patient ID: Carol Mahoney, female    DOB: 1975/08/08, 45 y.o.   MRN: [id number]  This visit occurred during the SARS-CoV-2 public health emergency.  Safety protocols were in place, including screening questions prior to the visit, additional usage of staff PPE, and extensive cleaning of exam room while observing appropriate contact time as indicated for disinfecting solutions.   Patient here for a scheduled follow up.   Chief Complaint  Patient presents with   Hypothyroidism   Gastroesophageal Reflux   .   HPI She has chronic GI issues - abdominal discomfort and diarrhea.  Recent ultrasound - suggested pancreatitis - (dilated pancreatic duct and pseudocyst).  Saw GI.  Had CT scan revealed prior gastric bypass. The small gastric pouch -  partially herniated into the chest with a suggestion of partially unwrapped fundoplication. Full thickening in the jejunum is nonspecific but could reflect inflammation. There is also some mild smooth wall thickening in the descending and sigmoid colon which could reflect low-grade inflammation. Discussed with her today.  Discuss f/u with GI.  Seeing cardiology.  S/p single chamber ICD placed.  Overall stable.  Last seen 12/2020.  Recommended 3 month f/u.  No chest pain.  Breathing stable.  No cough or congestion.  Eating.  Increased stress - taking care of her grandson.  Sees psychiatry.    Past Medical History:  Diagnosis Date   Anemia    Anxiety    Depression    GERD (gastroesophageal reflux disease)    H/O hiatal hernia    Hypertension    Hypertr obst cardiomyop    mitral regurgitation, LAE - followed at [facility]   Hypothyroidism    Migraine headache    PONV (postoperative nausea and vomiting)    Past Surgical History:  Procedure Laterality Date   BUNIONECTOMY     CHOLECYSTECTOMY     HIATAL HERNIA REPAIR     IMPLANTABLE CARDIOVERTER DEFIBRILLATOR  REVISION  05/21/14   [facility]   PARTIAL GASTRECTOMY     TONSILLECTOMY     Family History  Problem Relation Age of Onset   Anesthesia problems Father    Hypertension Mother    Hashimoto's thyroiditis Maternal Grandmother    Hypertension Maternal Grandmother    Melanoma Maternal Grandmother    Breast cancer Neg Hx    Colon cancer Neg Hx    Social History   Socioeconomic History   Marital status: Single    Spouse name: Not on file   Number of children: 1   Years of education: Not on file   Highest education level: Not on file  Occupational History   Occupation: PROGRAM ANALYST    Employer: [employer]  Tobacco Use   Smoking status: Never   Smokeless tobacco: Never  Vaping Use   Vaping Use: Never used  Substance and Sexual Activity   Alcohol use: No    Alcohol/week: 0.0 standard drinks   Drug use: No   Sexual activity: Not Currently  Other Topics Concern   Not on file  Social History Narrative   Not on file   Social Determinants of Health   Financial Resource Strain: Not on file  Food Insecurity: Not on file  Transportation Needs: Not on file  Physical Activity: Not on file  Stress: Not on file  Social Connections: Not on file     Review of Systems  Constitutional:  Negative for appetite change and unexpected weight change.  HENT:  Negative for congestion and sinus pressure.   Respiratory:  Negative for cough, chest tightness and shortness of breath.   Cardiovascular:  Negative for chest pain, palpitations and leg swelling.  Gastrointestinal:  Positive for abdominal pain and diarrhea. Negative for vomiting.  Genitourinary:  Negative for difficulty urinating and dysuria.  Musculoskeletal:  Negative for joint swelling and myalgias.  Skin:  Negative for color change and wound.  Neurological:  Negative for dizziness and light-headedness.       Followed by Dr Manuella Ghazi - for headaches.  Stable.       Objective:     BP 102/70   Pulse 65   Temp 98.1 F (36.7 C)   Resp  16   Ht 5\' 7"  (1.702 m)   Wt 145 lb (65.8 kg)   LMP 10/08/2011   SpO2 98%   BMI 22.71 kg/m  Wt Readings from Last 3 Encounters:  03/05/21 145 lb (65.8 kg)  07/03/20 143 lb (64.9 kg)  04/01/20 140 lb 12.8 oz (63.9 kg)    Physical Exam Vitals reviewed.  Constitutional:      General: She is not in acute distress.    Appearance: Normal appearance.  HENT:     Head: Normocephalic and atraumatic.     Right Ear: External ear normal.     Left Ear: External ear normal.  Eyes:     General: No scleral icterus.       Right eye: No discharge.        Left eye: No discharge.     Conjunctiva/sclera: Conjunctivae normal.  Neck:     Thyroid: No thyromegaly.  Cardiovascular:     Rate and Rhythm: Normal rate and regular rhythm.  Pulmonary:     Effort: No respiratory distress.     Breath sounds: Normal breath sounds. No wheezing.  Abdominal:     General: Bowel sounds are normal.     Palpations: Abdomen is soft.     Tenderness: There is no abdominal tenderness.  Musculoskeletal:        General: No swelling or tenderness.     Cervical back: Neck supple. No tenderness.  Lymphadenopathy:     Cervical: No cervical adenopathy.  Skin:    Findings: No erythema or rash.  Neurological:     Mental Status: She is alert.  Psychiatric:        Mood and Affect: Mood normal.        Behavior: Behavior normal.     Outpatient Encounter Medications as of 03/05/2021  Medication Sig   alosetron (LOTRONEX) 1 MG tablet Take 1 tablet (1 mg total) by mouth 2 (two) times daily.   ALPRAZolam (XANAX) 0.5 MG tablet 0.5 mg 3 (three) times daily as needed.    Cholecalciferol 25 MCG (1000 UT) capsule Take 1,000 Units by mouth daily.   clobetasol cream (TEMOVATE) 0.05 % Apply to affected area on colored side of ttoo on left forearm once to twice daily for itch. Avoid Face, groin, and underarm   cyclobenzaprine (FLEXERIL) 5 MG tablet Take 1 tablet (5 mg total) by mouth at bedtime as needed for muscle spasms.    diphenoxylate-atropine (LOMOTIL) 2.5-0.025 MG tablet Take 1 tablet by mouth 4 (four) times daily as needed for diarrhea or loose stools.   EPINEPHrine (EPIPEN 2-PAK) 0.3 mg/0.3 mL IJ SOAJ injection USE AS DIRECTED ON PACKAGE   furosemide (LASIX) 20 MG tablet TAKE 1/2 TABLET BY MOUTH ONCE A  DAY AS NEEDED   hydrocortisone (ANUSOL-HC) 25 MG suppository Place 1 suppository (25 mg total) rectally at bedtime.   levothyroxine (SYNTHROID) 100 MCG tablet Take 1 tablet (100 mcg total) by mouth daily before breakfast.   ondansetron (ZOFRAN) 4 MG tablet Take 1 tablet (4 mg total) by mouth 2 (two) times daily as needed for nausea or vomiting.   pantoprazole (PROTONIX) 40 MG tablet TAKE 1 TABLET BY MOUTH  DAILY   SOOLANTRA 1 % CREA Apply to face qhs   SUMAtriptan (IMITREX) 50 MG tablet Take 1 tablet by mouth  every 2 hours as needed for migraine(s)   tacrolimus (PROTOPIC) 0.1 % ointment    traZODone (DESYREL) 50 MG tablet 50 mg. Takes 1/2 tablet as needed at bedtime   TRINTELLIX 20 MG TABS tablet TK 1 T PO QAM   vitamin B-12 (CYANOCOBALAMIN) 1000 MCG tablet Take 1 tablet (1,000 mcg total) by mouth daily.   [DISCONTINUED] levothyroxine (SYNTHROID) 100 MCG tablet TAKE 1 TABLET BY MOUTH DAILY BEFORE BREAKFAST.   No facility-administered encounter medications on file as of 03/05/2021.     Lab Results  Component Value Date   WBC 4.0 10/28/2020   HGB 13.2 10/28/2020   HCT 38.7 10/28/2020   PLT 166 10/28/2020   GLUCOSE 102 (H) 10/28/2020   CHOL 175 07/24/2020   TRIG 61 07/24/2020   HDL 66 07/24/2020   LDLCALC 97 07/24/2020   ALT 38 (H) 07/24/2020   AST 33 07/24/2020   NA 140 10/28/2020   K 3.9 10/28/2020   CL 103 10/28/2020   CREATININE 0.82 10/28/2020   BUN 10 10/28/2020   CO2 26 10/28/2020   TSH 0.642 10/28/2020   INR 1.0 09/26/2014   HGBA1C 4.9 12/12/2014    CT ABDOMEN PELVIS WO CONTRAST  Result Date: 12/03/2020 CLINICAL DATA:  Intermittent left mid abdominal discomfort for 2-3 years.  History of irritable bowel syndrome. EXAM: CT ABDOMEN AND PELVIS WITHOUT CONTRAST TECHNIQUE: Multidetector CT imaging of the abdomen and pelvis was performed following the standard protocol without IV contrast. COMPARISON:  Abdominal ultrasound 10/10/2019 FINDINGS: Lower chest: AICD lead noted. Hiatal hernia containing postoperative findings including potential un wrapped fundoplication. Diminutive residual stomach with gastrojejunostomy noted. Hepatobiliary: Cholecystectomy.  Cholecystectomy. Pancreas: Unremarkable. No pseudocyst or peripancreatic abnormal fluid collection identified. Spleen: The spleen measures 11.8 by 5.8 by 10.3 cm (volume = 370 cm^3), upper normal size and similar to prior exam from 2012. Adrenals/Urinary Tract: Unremarkable Stomach/Bowel: Prior gastric bypass with small gastric pouch with hiatal hernia. No complicating feature involving the bypassed portion of the stomach. The orally administered contrast extends through to the rectum. Nonspecific jejunal fold thickening. No dilated bowel. Scattered diverticula of the descending colon. Borderline wall thickening and somewhat featureless appearance of portions of the descending and sigmoid colon. Vascular/Lymphatic: Unremarkable Reproductive: Uterus absent.  Adnexa unremarkable. Other: Small fluid density structure in the right perirectal region measuring 2.1 by 0.9 cm, formerly 4.4 by 1.5 cm. Musculoskeletal: Unremarkable IMPRESSION: 1. Prior gastric bypass. The small gastric pouch it is partially herniated into the chest with a suggestion of partially un wrapped fundoplication. 2. Full thickening in the jejunum is nonspecific but could reflect inflammation. There is also some mild smooth wall thickening in the descending and sigmoid colon which could reflect low-grade inflammation. 3. Normal non-contrast appearance of the pancreas common no pseudocyst. 4. Small fluid density structure in the right perirectal space is substantially reduced in  size compared to 03/06/2011, and likely benign/incidental. Electronically Signed   By:  Van Clines M.D.   On: 12/03/2020 16:22       Assessment & Plan:   Problem List Items Addressed This Visit     Abnormal CT of the abdomen    Inflammation on CT scan as outlined.  No acid reflux. Persistent diarrhea.  Discussed f/u with GI.        ANXIETY DEPRESSION    Followed by Dr Toy Care.        Diarrhea, unspecified    Has previously had GI w/up.  Recent CT scan as outlined.  Seeing GI.  F/u with Dr Allen Norris.        GERD (gastroesophageal reflux disease)    On protonix.  No upper symptoms reported.        Hypertrophic obstructive cardiomyopathy (HCC)    S/p septal myomectomy.  S/p ICD placement.  Stable.       Relevant Orders   Lipid panel   Hepatic function panel   Basic metabolic panel   Hypotension    Blood pressure overall doing better.  Not needing lasix.  Follow.       Hypothyroidism    On thyroid replacement.  Follow tsh.       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  Previously received iron infusions.  Follow cbc and iron studies.        Relevant Orders   CBC with Differential/Platelet   Ferritin   Migraine    Followed by Dr Manuella Ghazi.  Mag oxide.  Stable.       Pancreatic pseudocyst    Found on recent ultrasound.  Had f/u CT as outlined.  F/u with GI.       Vitamin D deficiency    Continue vitamin D supplements.       Relevant Orders   VITAMIN D 25 Hydroxy (Vit-D Deficiency, Fractures)   Other Visit Diagnoses     Visit for screening mammogram    -  Primary   Relevant Orders   MM 3D SCREEN BREAST BILATERAL        Einar Pheasant, MD

## 2021-03-15 ENCOUNTER — Encounter: Payer: Self-pay | Admitting: Internal Medicine

## 2021-03-15 DIAGNOSIS — R935 Abnormal findings on diagnostic imaging of other abdominal regions, including retroperitoneum: Secondary | ICD-10-CM | POA: Insufficient documentation

## 2021-03-15 NOTE — Assessment & Plan Note (Signed)
On protonix.  No upper symptoms reported.   

## 2021-03-15 NOTE — Assessment & Plan Note (Signed)
On thyroid replacement.  Follow tsh.  

## 2021-03-15 NOTE — Assessment & Plan Note (Signed)
Continue vitamin D supplements.  

## 2021-03-15 NOTE — Assessment & Plan Note (Signed)
Inflammation on CT scan as outlined.  No acid reflux. Persistent diarrhea.  Discussed f/u with GI.

## 2021-03-15 NOTE — Assessment & Plan Note (Signed)
Has previously had GI w/up.  Recent CT scan as outlined.  Seeing GI.  F/u with Dr Allen Norris.

## 2021-03-15 NOTE — Assessment & Plan Note (Signed)
Found on recent ultrasound.  Had f/u CT as outlined.  F/u with GI.

## 2021-03-15 NOTE — Assessment & Plan Note (Signed)
Followed by Dr Toy Care.

## 2021-03-15 NOTE — Assessment & Plan Note (Addendum)
Followed by Dr Manuella Ghazi.  Mag oxide.  Stable.

## 2021-03-15 NOTE — Assessment & Plan Note (Signed)
Blood pressure overall doing better.  Not needing lasix.  Follow.

## 2021-03-15 NOTE — Assessment & Plan Note (Signed)
Has been evaluated by hematology.  Previously received iron infusions.  Follow cbc and iron studies.   

## 2021-03-15 NOTE — Assessment & Plan Note (Signed)
S/p septal myomectomy.  S/p ICD placement.  Stable.  

## 2021-03-27 ENCOUNTER — Other Ambulatory Visit: Payer: Self-pay | Admitting: Internal Medicine

## 2021-04-15 ENCOUNTER — Other Ambulatory Visit: Payer: Self-pay

## 2021-04-15 ENCOUNTER — Ambulatory Visit
Admission: RE | Admit: 2021-04-15 | Discharge: 2021-04-15 | Disposition: A | Discharge status: Home / Self Care | Payer: Managed Care, Other (non HMO) | Source: Ambulatory Visit | Attending: Internal Medicine | Admitting: Internal Medicine

## 2021-04-15 DIAGNOSIS — Z1231 Encounter for screening mammogram for malignant neoplasm of breast: Secondary | ICD-10-CM | POA: Insufficient documentation

## 2021-05-27 ENCOUNTER — Ambulatory Visit: Payer: Managed Care, Other (non HMO) | Admitting: Dermatology

## 2021-05-27 ENCOUNTER — Other Ambulatory Visit: Payer: Self-pay

## 2021-05-27 DIAGNOSIS — Z79899 Other long term (current) drug therapy: Secondary | ICD-10-CM | POA: Diagnosis not present

## 2021-05-27 DIAGNOSIS — L720 Epidermal cyst: Secondary | ICD-10-CM | POA: Diagnosis not present

## 2021-05-27 DIAGNOSIS — L409 Psoriasis, unspecified: Secondary | ICD-10-CM

## 2021-05-27 DIAGNOSIS — L719 Rosacea, unspecified: Secondary | ICD-10-CM | POA: Diagnosis not present

## 2021-05-27 MED ORDER — OTEZLA 10 & 20 & 30 MG PO TBPK: ORAL_TABLET | ORAL | 0 refills | Status: DC

## 2021-05-27 MED ORDER — VTAMA 1 % EX CREA: 1.0000 "application " | TOPICAL_CREAM | Freq: Every day | CUTANEOUS | 3 refills | Status: DC

## 2021-05-27 MED ORDER — IVERMECTIN 1 % EX CREA: TOPICAL_CREAM | CUTANEOUS | 5 refills | Status: DC

## 2021-05-27 MED ORDER — OTEZLA 30 MG PO TABS: 30.0000 mg | ORAL_TABLET | Freq: Two times a day (BID) | ORAL | 5 refills | Status: DC

## 2021-05-27 MED ORDER — DOXYCYCLINE HYCLATE 50 MG PO CAPS: 50.0000 mg | ORAL_CAPSULE | Freq: Two times a day (BID) | ORAL | 5 refills | Status: DC

## 2021-05-27 MED ORDER — OTEZLA 30 MG PO TABS: 30.0000 mg | ORAL_TABLET | Freq: Two times a day (BID) | ORAL | 12 refills | Status: DC

## 2021-05-27 NOTE — Progress Notes (Signed)
Follow-Up Visit   Subjective  Carol Mahoney is a 46 y.o. female who presents for the following: Psoriasis (Scalp, face, body x years. In the past patient has used ciclopirox shampoo, clobetasol solution, Protopic ointment, tar shampoos, Elocon solution, Derma-Smoothe F/S oil. Patient has a lot of hair making it difficult to apply shampoo and solution. She would like a simpler treatment if possible. ), Rosacea (Face x years, pinkness and bumps. In the past, she has been controlled with Soolantra Cream and doxycycline 50mg  daily. ), and Spot (Right neck, scaly. Patient would like checked.  ).   The following portions of the chart were reviewed this encounter and updated as appropriate:       Review of Systems:  No other skin or systemic complaints except as noted in HPI or Assessment and Plan.  Objective  Well appearing patient in no apparent distress; mood and affect are within normal limits.  A focused examination was performed including face, scalp, neck. Relevant physical exam findings are noted in the Assessment and Plan.  Scalp Diffuse confluent thick scale of the scalp with scaly pink plaque on the posterior scalp, occipital hairline; pink scaliness of the ears, eyebrows, eyelids, cheeks; pink scaly patches of the bilateral axilla.  BSA~8%  face Bright erythema and pink papules of the cheeks, nose.   R neck 2 mm firm white papule with mild dusky erythema.     Assessment & Plan  Psoriasis Scalp  Severe scalp involvement, not responding to topical treatment  Psoriasis is a chronic non-curable, but treatable genetic/hereditary disease that may have other systemic features affecting other organ systems such as joints (Psoriatic Arthritis). It is associated with an increased risk of inflammatory bowel disease, heart disease, non-alcoholic fatty liver disease, and depression.    Start Vtama Cream Apply to AA psoriasis face/body/axilla QD dsp 60g 3Rf. ([place]) Lot [id number] Exp  05/2022 x 2.  Start Otezla 30mg  take 1 po BID as tolerated. Starter pack, Bridge dose, and Maintenance Rxs sent to [facility]. Sample packs x 2 given to patient today. Lot [id number] Exp 05/23/22 x 2.  Side effects of Otezla (apremilast) include diarrhea, nausea, headache, upper respiratory infection, depression, and weight decrease (5-10%). It should only be taken by pregnant women after a discussion regarding risks and benefits with their doctor. Goal is control of skin condition, not cure.  The use of [place] Nail requires long term medication management, including periodic office visits.   Apremilast (OTEZLA) 10 & 20 & 30 MG TBPK - Scalp Take as directed per package instructions.  Apremilast (OTEZLA) 30 MG TABS - Scalp Take 1 tablet (30 mg total) by mouth 2 (two) times daily.  Apremilast (OTEZLA) 30 MG TABS - Scalp Take 1 tablet (30 mg total) by mouth 2 (two) times daily.  Tapinarof (VTAMA) 1 % CREA - Scalp Apply 1 application topically daily. Apply to affected areas face, underarms, body once a day for psoriasis.  Related Medications doxycycline (VIBRAMYCIN) 50 MG capsule Take 1 capsule (50 mg total) by mouth 2 (two) times daily. Take with food.  Rosacea face  With flare Rosacea is a chronic progressive skin condition usually affecting the face of adults, causing redness and/or acne bumps. It is treatable but not curable. It sometimes affects the eyes (ocular rosacea) as well. It may respond to topical and/or systemic medication and can flare with stress, sun exposure, alcohol, exercise and some foods.  Daily application of broad spectrum spf 30+ sunscreen to face is recommended to reduce flares.  Restart Soolantra cream qhs to face Restart Doxycycline 50 mg PO qd/bid prn flares  Doxycycline should be taken with food to prevent nausea. Do not lay down for 30 minutes after taking. Be cautious with sun exposure and use good sun protection while on this medication. Pregnant women should not  take this medication.       Milia R neck  Recently inflamed. Benign, observe.  Discussed extraction if symptomatic     Return in about 1 month (around 06/27/2021) for Psoriasis.  IJamesetta Orleans, CMA, am acting as scribe for Brendolyn Patty, MD . Documentation: I have reviewed the above documentation for accuracy and completeness, and I agree with the above.  Brendolyn Patty MD

## 2021-05-27 NOTE — Patient Instructions (Addendum)
Side effects of Otezla (apremilast) include diarrhea, nausea, headache, upper respiratory infection, depression, and weight decrease (5-10%). It should only be taken by pregnant women after a discussion regarding risks and benefits with their doctor. Goal is control of skin condition, not cure.  The use of [place] Nail requires long term medication management, including periodic office visits.  Psoriasis is a chronic non-curable, but treatable genetic/hereditary disease that may have other systemic features affecting other organ systems such as joints (Psoriatic Arthritis). It is associated with an increased risk of inflammatory bowel disease, heart disease, non-alcoholic fatty liver disease, and depression.      Rosacea  What is rosacea? Rosacea (say: ro-zay-sha) is a common skin disease that usually begins as a trend of flushing or blushing easily.  As rosacea progresses, a persistent redness in the center of the face will develop and may gradually spread beyond the nose and cheeks to the forehead and chin.  In some cases, the ears, chest, and back could be affected.  Rosacea may appear as tiny blood vessels or small red bumps that occur in crops.  Frequently they can contain pus, and are called pustules.  If the bumps do not contain pus, they are referred to as papules.  Rarely, in prolonged, untreated cases of rosacea, the oil glands of the nose and cheeks may become permanently enlarged.  This is called rhinophyma, and is seen more frequently in men.  Signs and Risks In its beginning stages, rosacea tends to come and go, which makes it difficult to recognize.  It can start as intermittent flushing of the face.  Eventually, blood vessels may become permanently visible.  Pustules and papules can appear, but can be mistaken for adult acne.  People of all races, ages, genders and ethnic groups are at risk of developing rosacea.  However, it is more common in women (especially around menopause) and adults  with fair skin between the ages of 48 and 40.  Treatment Dermatologists typically recommend a combination of treatments to effectively manage rosacea.  Treatment can improve symptoms and may stop the progression of the rosacea.  Treatment may involve both topical and oral medications.  The tetracycline antibiotics are often used for their anti-inflammatory effect; however, because of the possibility of developing antibiotic resistance, they should not be used long term at full dose.  For dilated blood vessels the options include electrodessication (uses electric current through a small needle), laser treatment, and cosmetics to hide the redness.   With all forms of treatment, improvement is a slow process, and patients may not see any results for the first 3-4 weeks.  It is very important to avoid the sun and other triggers.  Patients must wear sunscreen daily.  Skin Care Instructions: Cleanse the skin with a mild soap such as CeraVe cleanser, Cetaphil cleanser, or Dove soap once or twice daily as needed. Moisturize with Eucerin Redness Relief Daily Perfecting Lotion (has a subtle green tint), CeraVe Moisturizing Cream, or Oil of Olay Daily Moisturizer with sunscreen every morning and/or night as recommended. Makeup should be non-comedogenic (wont clog pores) and be labeled for sensitive skin. Good choices for cosmetics are: Neutrogena, Almay, and Physicians Formula.  Any product with a green tint tends to offset a red complexion. If your eyes are dry and irritated, use artificial tears 2-3 times per day and cleanse the eyelids daily with baby shampoo.  Have your eyes examined at least every 2 years.  Be sure to tell your eye doctor that you have  rosacea. Alcoholic beverages tend to cause flushing of the skin, and may make rosacea worse. Always wear sunscreen, protect your skin from extreme hot and cold temperatures, and avoid spicy foods, hot drinks, and mechanical irritation such as rubbing,  scrubbing, or massaging the face.  Avoid harsh skin cleansers, cleansing masks, astringents, and exfoliation. If a particular product burns or makes your face feel tight, then it is likely to flare your rosacea. If you are having difficulty finding a sunscreen that you can tolerate, you may try switching to a chemical-free sunscreen.  These are ones whose active ingredient is zinc oxide or titanium dioxide only.  They should also be fragrance free, non-comedogenic, and labeled for sensitive skin. Rosacea triggers may vary from person to person.  There are a variety of foods that have been reported to trigger rosacea.  Some patients find that keeping a diary of what they were doing when they flared helps them avoid triggers.   If You Need Anything After Your Visit  If you have any questions or concerns for your doctor, please call our main line at [phone] and press option 4 to reach your doctor's medical assistant. If no one answers, please leave a voicemail as directed and we will return your call as soon as possible. Messages left after 4 pm will be answered the following business day.   You may also send [place] a message via [place]. We typically respond to MyChart messages within 1-2 business days.  For prescription refills, please ask your pharmacy to contact our office. Our fax number is [phone].  If you have an urgent issue when the clinic is closed that cannot wait until the next business day, you can page your doctor at the number below.    Please note that while we do our best to be available for urgent issues outside of office hours, we are not available 24/7.   If you have an urgent issue and are unable to reach [place], you may choose to seek medical care at your doctor's office, retail clinic, urgent care center, or emergency room.  If you have a medical emergency, please immediately call 911 or go to the emergency department.  Pager Numbers  - Dr. Nehemiah Massed: [phone]  - Dr.  Laurence Ferrari: [phone]  - Dr. Nicole Kindred: [phone]  In the event of inclement weather, please call our main line at [phone] for an update on the status of any delays or closures.  Dermatology Medication Tips: Please keep the boxes that topical medications come in in order to help keep track of the instructions about where and how to use these. Pharmacies typically print the medication instructions only on the boxes and not directly on the medication tubes.   If your medication is too expensive, please contact our office at [phone] option 4 or send [place] a message through [place].   We are unable to tell what your co-pay for medications will be in advance as this is different depending on your insurance coverage. However, we may be able to find a substitute medication at lower cost or fill out paperwork to get insurance to cover a needed medication.   If a prior authorization is required to get your medication covered by your insurance company, please allow [place] 1-2 business days to complete this process.  Drug prices often vary depending on where the prescription is filled and some pharmacies may offer cheaper prices.  The website www.goodrx.com contains coupons for medications through different pharmacies. The prices here do not  account for what the cost may be with help from insurance (it may be cheaper with your insurance), but the website can give you the price if you did not use any insurance.  - You can print the associated coupon and take it with your prescription to the pharmacy.  - You may also stop by our office during regular business hours and pick up a GoodRx coupon card.  - If you need your prescription sent electronically to a different pharmacy, notify our office through [facility] or by phone at [phone] option 4.     Si Usted Necesita Algo Despus de Su Visita  Tambin puede enviarnos un mensaje a travs de Pharmacist, community. Por lo general respondemos a los mensajes  de MyChart en el transcurso de 1 a 2 das hbiles.  Para renovar recetas, por favor pida a su farmacia que se ponga en contacto con nuestra oficina. Harland Dingwall de fax es [place] [phone].  Si tiene un asunto urgente cuando la clnica est cerrada y que no puede esperar hasta el siguiente da hbil, puede llamar/localizar a su doctor(a) al nmero que aparece a continuacin.   Por favor, tenga en cuenta que aunque hacemos todo lo posible para estar disponibles para asuntos urgentes fuera del horario de [place], no estamos disponibles las 24 horas del da, los 7 das de la [place].   Si tiene un problema urgente y no puede comunicarse con nosotros, puede optar por buscar atencin mdica  en el consultorio de su doctor(a), en una clnica privada, en un centro de atencin urgente o en una sala de emergencias.  Si tiene Engineering geologist, por favor llame inmediatamente al 911 o vaya a la sala de emergencias.  Nmeros de bper  - Dr. Nehemiah Massed: [phone]  - Dra. Moye: [phone]  - Dra. Nicole Kindred: [phone]  En caso de inclemencias del [place], por favor llame a Johnsie Kindred principal al [phone] para una actualizacin sobre el [place] de cualquier retraso o cierre.  Consejos para la medicacin en dermatologa: Por favor, guarde las cajas en las que vienen los medicamentos de uso tpico para ayudarle a seguir las instrucciones sobre dnde y cmo usarlos. Las farmacias generalmente imprimen las instrucciones del medicamento slo en las cajas y no directamente en los tubos del [place].   Si su medicamento es muy caro, por favor, pngase en contacto con Zigmund Daniel llamando al [phone] y presione la opcin 4 o envenos un mensaje a travs de Pharmacist, community.   No podemos decirle cul ser su copago por los medicamentos por adelantado ya que esto es diferente dependiendo de la cobertura de su seguro. Sin embargo, es posible que podamos encontrar un medicamento sustituto a Actor un formulario para que el seguro cubra el medicamento que se considera necesario.   Si se requiere una autorizacin previa para que su compaa de seguros [place] su medicamento, por favor permtanos de 1 a 2 das hbiles para completar este proceso.  Los precios de los medicamentos varan con frecuencia dependiendo del Environmental consultant de dnde se surte la receta y alguna farmacias pueden ofrecer precios ms baratos.  El sitio web www.goodrx.com tiene cupones para medicamentos de Airline pilot. Los precios aqu no tienen en cuenta lo que podra costar con la ayuda del seguro (puede ser ms barato con su seguro), pero el sitio web puede darle el precio si no utiliz Research scientist (physical sciences).  - Puede imprimir el cupn correspondiente y llevarlo con su receta a la farmacia.  -  Tambin puede pasar por nuestra oficina durante el horario de atencin regular y Charity fundraiser una tarjeta de cupones de GoodRx.  - Si necesita que su receta se enve electrnicamente a una farmacia diferente, informe a nuestra oficina a travs de MyChart de [place] o por telfono llamando al [phone] y presione la opcin 4.

## 2021-05-28 ENCOUNTER — Encounter: Payer: Self-pay | Admitting: Dermatology

## 2021-05-28 ENCOUNTER — Other Ambulatory Visit: Payer: Self-pay

## 2021-05-28 DIAGNOSIS — L409 Psoriasis, unspecified: Secondary | ICD-10-CM

## 2021-05-28 DIAGNOSIS — L719 Rosacea, unspecified: Secondary | ICD-10-CM

## 2021-05-28 MED ORDER — IVERMECTIN 1 % EX CREA: TOPICAL_CREAM | CUTANEOUS | 5 refills | Status: DC

## 2021-05-28 MED ORDER — DOXYCYCLINE HYCLATE 50 MG PO CAPS: 50.0000 mg | ORAL_CAPSULE | Freq: Two times a day (BID) | ORAL | 5 refills | Status: DC

## 2021-05-28 NOTE — Addendum Note (Signed)
Addended by: Rudell Cobb A on: 05/28/2021 04:04 PM   Modules accepted: Orders

## 2021-05-28 NOTE — Addendum Note (Signed)
Addended by: Rudell Cobb A on: 05/28/2021 04:07 PM   Modules accepted: Orders

## 2021-06-08 ENCOUNTER — Other Ambulatory Visit: Payer: Self-pay

## 2021-06-08 DIAGNOSIS — L409 Psoriasis, unspecified: Secondary | ICD-10-CM

## 2021-06-08 MED ORDER — OTEZLA 30 MG PO TABS: 30.0000 mg | ORAL_TABLET | Freq: Two times a day (BID) | ORAL | 1 refills | Status: DC

## 2021-06-16 ENCOUNTER — Ambulatory Visit: Payer: Managed Care, Other (non HMO) | Admitting: Internal Medicine

## 2021-06-16 ENCOUNTER — Other Ambulatory Visit: Payer: Self-pay

## 2021-06-16 ENCOUNTER — Encounter: Payer: Self-pay | Admitting: Dermatology

## 2021-06-16 DIAGNOSIS — K580 Irritable bowel syndrome with diarrhea: Secondary | ICD-10-CM

## 2021-06-16 DIAGNOSIS — E039 Hypothyroidism, unspecified: Secondary | ICD-10-CM

## 2021-06-16 DIAGNOSIS — I421 Obstructive hypertrophic cardiomyopathy: Secondary | ICD-10-CM

## 2021-06-16 DIAGNOSIS — R935 Abnormal findings on diagnostic imaging of other abdominal regions, including retroperitoneum: Secondary | ICD-10-CM

## 2021-06-16 DIAGNOSIS — K219 Gastro-esophageal reflux disease without esophagitis: Secondary | ICD-10-CM | POA: Diagnosis not present

## 2021-06-16 DIAGNOSIS — D508 Other iron deficiency anemias: Secondary | ICD-10-CM

## 2021-06-16 DIAGNOSIS — F341 Dysthymic disorder: Secondary | ICD-10-CM

## 2021-06-16 DIAGNOSIS — D696 Thrombocytopenia, unspecified: Secondary | ICD-10-CM

## 2021-06-16 DIAGNOSIS — E559 Vitamin D deficiency, unspecified: Secondary | ICD-10-CM

## 2021-06-16 NOTE — Progress Notes (Signed)
Patient ID: Carol Mahoney, female   DOB: 1976/05/11, 46 y.o.   MRN: [id number]   Subjective:    Patient ID: Carol Mahoney, female    DOB: 1976-01-02, 46 y.o.   MRN: [id number]  This visit occurred during the SARS-CoV-2 public health emergency.  Safety protocols were in place, including screening questions prior to the visit, additional usage of staff PPE, and extensive cleaning of exam room while observing appropriate contact time as indicated for disinfecting solutions.   Patient here for a scheduled follow up.    HPI F/u regarding increased stress, blood pressure and HOCM.  Still with increased stress.  Discussed.  Seeing psychiatry.  Will notify me if feels needs something more.  No chest pain.  Breathing stable.  No acid reflux.  No increased abdominal pain.  Bowels - same - no change.  Did not tolerate otezla.     Past Medical History:  Diagnosis Date   Anemia    Anxiety    Depression    GERD (gastroesophageal reflux disease)    H/O hiatal hernia    Hypertension    Hypertr obst cardiomyop    mitral regurgitation, LAE - followed at [facility]   Hypothyroidism    Migraine headache    PONV (postoperative nausea and vomiting)    Past Surgical History:  Procedure Laterality Date   BUNIONECTOMY     CHOLECYSTECTOMY     HIATAL HERNIA REPAIR     IMPLANTABLE CARDIOVERTER DEFIBRILLATOR REVISION  05/21/14   [facility]   PARTIAL GASTRECTOMY     TONSILLECTOMY     Family History  Problem Relation Age of Onset   Anesthesia problems Father    Hypertension Mother    Hashimoto's thyroiditis Maternal Grandmother    Hypertension Maternal Grandmother    Melanoma Maternal Grandmother    Breast cancer Neg Hx    Colon cancer Neg Hx    Social History   Socioeconomic History   Marital status: Single    Spouse name: Not on file   Number of children: 1   Years of education: Not on file   Highest education level: Not on file  Occupational History   Occupation: PROGRAM ANALYST    Employer: [employer]   Tobacco Use   Smoking status: Never   Smokeless tobacco: Never  Vaping Use   Vaping Use: Never used  Substance and Sexual Activity   Alcohol use: No    Alcohol/week: 0.0 standard drinks   Drug use: No   Sexual activity: Not Currently  Other Topics Concern   Not on file  Social History Narrative   Not on file   Social Determinants of Health   Financial Resource Strain: Not on file  Food Insecurity: Not on file  Transportation Needs: Not on file  Physical Activity: Not on file  Stress: Not on file  Social Connections: Not on file     Review of Systems  Constitutional:  Negative for appetite change and unexpected weight change.  HENT:  Negative for congestion and sinus pressure.   Respiratory:  Negative for cough, chest tightness and shortness of breath.   Cardiovascular:  Negative for chest pain and palpitations.       No increased leg swelling.    Gastrointestinal:  Negative for abdominal pain, diarrhea, nausea and vomiting.  Genitourinary:  Negative for difficulty urinating and dysuria.  Musculoskeletal:  Negative for joint swelling and myalgias.  Skin:  Negative for color change and rash.  Neurological:  Negative for dizziness, light-headedness  and headaches.  Psychiatric/Behavioral:  Negative for agitation and dysphoric mood.       Objective:     BP 120/70    Pulse 70    Temp 97.8 F (36.6 C)    Resp 16    Ht 5\' 7"  (1.702 m)    Wt 146 lb (66.2 kg)    LMP 10/08/2011    SpO2 99%    BMI 22.87 kg/m  Wt Readings from Last 3 Encounters:  06/16/21 146 lb (66.2 kg)  03/05/21 145 lb (65.8 kg)  07/03/20 143 lb (64.9 kg)    Physical Exam Vitals reviewed.  Constitutional:      General: She is not in acute distress.    Appearance: Normal appearance.  HENT:     Head: Normocephalic and atraumatic.     Right Ear: External ear normal.     Left Ear: External ear normal.  Eyes:     General: No scleral icterus.       Right eye: No discharge.        Left eye: No  discharge.     Conjunctiva/sclera: Conjunctivae normal.  Neck:     Thyroid: No thyromegaly.  Cardiovascular:     Rate and Rhythm: Normal rate and regular rhythm.  Pulmonary:     Effort: No respiratory distress.     Breath sounds: Normal breath sounds. No wheezing.  Abdominal:     General: Bowel sounds are normal.     Palpations: Abdomen is soft.     Tenderness: There is no abdominal tenderness.  Musculoskeletal:        General: No swelling or tenderness.     Cervical back: Neck supple. No tenderness.  Lymphadenopathy:     Cervical: No cervical adenopathy.  Skin:    Findings: No erythema or rash.  Neurological:     Mental Status: She is alert.  Psychiatric:        Mood and Affect: Mood normal.        Behavior: Behavior normal.     Outpatient Encounter Medications as of 06/16/2021  Medication Sig   ALPRAZolam (XANAX) 0.5 MG tablet 0.5 mg 3 (three) times daily as needed.    Apremilast (OTEZLA) 30 MG TABS Take 1 tablet (30 mg total) by mouth 2 (two) times daily.   Cholecalciferol 25 MCG (1000 UT) capsule Take 1,000 Units by mouth daily.   clobetasol cream (TEMOVATE) 0.05 % Apply to affected area on colored side of ttoo on left forearm once to twice daily for itch. Avoid Face, groin, and underarm   cyclobenzaprine (FLEXERIL) 5 MG tablet Take 1 tablet (5 mg total) by mouth at bedtime as needed for muscle spasms.   diphenoxylate-atropine (LOMOTIL) 2.5-0.025 MG tablet Take 1 tablet by mouth 4 (four) times daily as needed for diarrhea or loose stools.   doxycycline (VIBRAMYCIN) 50 MG capsule Take 1 capsule (50 mg total) by mouth 2 (two) times daily. Take with food.   EPINEPHrine (EPIPEN 2-PAK) 0.3 mg/0.3 mL IJ SOAJ injection USE AS DIRECTED ON PACKAGE   hydrocortisone (ANUSOL-HC) 25 MG suppository Place 1 suppository (25 mg total) rectally at bedtime.   Ivermectin (SOOLANTRA) 1 % CREA Apply to face qhs for rosacea.   levothyroxine (SYNTHROID) 100 MCG tablet Take 1 tablet (100 mcg total)  by mouth daily before breakfast.   ondansetron (ZOFRAN) 4 MG tablet Take 1 tablet (4 mg total) by mouth 2 (two) times daily as needed for nausea or vomiting.   pantoprazole (PROTONIX) 40 MG tablet  TAKE 1 TABLET BY MOUTH  DAILY   SUMAtriptan (IMITREX) 50 MG tablet Take 1 tablet by mouth  every 2 hours as needed for migraine(s)   tacrolimus (PROTOPIC) 0.1 % ointment    Tapinarof (VTAMA) 1 % CREA Apply 1 application topically daily. Apply to affected areas face, underarms, body once a day for psoriasis.   TRINTELLIX 20 MG TABS tablet TK 1 T PO QAM   vitamin B-12 (CYANOCOBALAMIN) 1000 MCG tablet Take 1 tablet (1,000 mcg total) by mouth daily.   No facility-administered encounter medications on file as of 06/16/2021.     Lab Results  Component Value Date   WBC 4.0 10/28/2020   HGB 13.2 10/28/2020   HCT 38.7 10/28/2020   PLT 166 10/28/2020   GLUCOSE 102 (H) 10/28/2020   CHOL 175 07/24/2020   TRIG 61 07/24/2020   HDL 66 07/24/2020   LDLCALC 97 07/24/2020   ALT 38 (H) 07/24/2020   AST 33 07/24/2020   NA 140 10/28/2020   K 3.9 10/28/2020   CL 103 10/28/2020   CREATININE 0.82 10/28/2020   BUN 10 10/28/2020   CO2 26 10/28/2020   TSH 0.642 10/28/2020   INR 1.0 09/26/2014   HGBA1C 4.9 12/12/2014    MM 3D SCREEN BREAST BILATERAL  Result Date: 04/15/2021 CLINICAL DATA:  Screening. EXAM: DIGITAL SCREENING BILATERAL MAMMOGRAM WITH TOMOSYNTHESIS AND CAD TECHNIQUE: Bilateral screening digital craniocaudal and mediolateral oblique mammograms were obtained. Bilateral screening digital breast tomosynthesis was performed. The images were evaluated with computer-aided detection. COMPARISON:  Previous exam(s). ACR Breast Density Category c: The breast tissue is heterogeneously dense, which may obscure small masses. FINDINGS: There are no findings suspicious for malignancy. IMPRESSION: No mammographic evidence of malignancy. A result letter of this screening mammogram will be mailed directly to the  patient. RECOMMENDATION: Screening mammogram in one year. (Code:SM-B-01Y) BI-RADS CATEGORY  1: Negative. Electronically Signed   By: Franki Cabot M.D.   On: 04/15/2021 13:28      Assessment & Plan:   Problem List Items Addressed This Visit     Abnormal CT of the abdomen    Inflammation on CT scan as outlined.  No acid reflux. Persistent diarrhea.  Needs f/u with GI.        ANXIETY DEPRESSION    Discussed today - increased stress - home/family stress.  Seeing Dr Toy Care.  Notify me if feels needs any further intervention.       GERD (gastroesophageal reflux disease)    On protonix.  No upper symptoms reported.        Hypertrophic obstructive cardiomyopathy (HCC)    S/p septal myomectomy.  S/p ICD placement.  Stable.       Hypothyroidism    On thyroid replacement.  Follow tsh.       IBS    Bowels - stable.        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  Previously received iron infusions.  Follow cbc and iron studies.        Thrombocytopenia ([place])    Follow cbc.       Vitamin D deficiency    Continue vitamin D supplements.         Einar Pheasant, MD

## 2021-06-17 ENCOUNTER — Other Ambulatory Visit: Payer: Self-pay

## 2021-06-17 DIAGNOSIS — L409 Psoriasis, unspecified: Secondary | ICD-10-CM

## 2021-06-17 NOTE — Progress Notes (Signed)
Order for biologic start placed./sh

## 2021-06-21 ENCOUNTER — Encounter: Payer: Self-pay | Admitting: Internal Medicine

## 2021-06-21 ENCOUNTER — Telehealth: Payer: Self-pay | Admitting: Internal Medicine

## 2021-06-21 NOTE — Assessment & Plan Note (Signed)
Follow cbc.  

## 2021-06-21 NOTE — Assessment & Plan Note (Signed)
Inflammation on CT scan as outlined.  No acid reflux. Persistent diarrhea.  Needs f/u with GI.

## 2021-06-21 NOTE — Assessment & Plan Note (Signed)
S/p septal myomectomy.  S/p ICD placement.  Stable.  

## 2021-06-21 NOTE — Assessment & Plan Note (Signed)
On thyroid replacement.  Follow tsh.  

## 2021-06-21 NOTE — Assessment & Plan Note (Signed)
Continue vitamin D supplements.  

## 2021-06-21 NOTE — Assessment & Plan Note (Signed)
Has been evaluated by hematology.  Previously received iron infusions.  Follow cbc and iron studies.   

## 2021-06-21 NOTE — Telephone Encounter (Signed)
She sees Dr Allen Norris - GI.  He had ordered a CT scan - question of inflammation.  If she does not have a f/u scheduled, I would like to schedule -would like to confirm no further intervention - given CTand persistent symptoms.

## 2021-06-21 NOTE — Assessment & Plan Note (Signed)
Discussed today - increased stress - home/family stress.  Seeing Dr Kaur.  Notify me if feels needs any further intervention.  

## 2021-06-21 NOTE — Assessment & Plan Note (Signed)
Bowels stable.  

## 2021-06-21 NOTE — Assessment & Plan Note (Signed)
On protonix.  No upper symptoms reported.   

## 2021-06-22 NOTE — Telephone Encounter (Signed)
Patient aware of below and is agreeable to see GI. She is going to call and make appt with them

## 2021-06-25 LAB — HEPATIC FUNCTION PANEL
ALT: 33 IU/L — ABNORMAL HIGH (ref 0–32)
AST: 33 IU/L (ref 0–40)
Albumin: 4.1 g/dL (ref 3.8–4.8)
Alkaline Phosphatase: 95 IU/L (ref 44–121)
Bilirubin Total: 0.6 mg/dL (ref 0.0–1.2)
Bilirubin, Direct: 0.16 mg/dL (ref 0.00–0.40)
Total Protein: 6.7 g/dL (ref 6.0–8.5)

## 2021-06-25 LAB — CBC WITH DIFFERENTIAL/PLATELET
Basophils Absolute: 0.1 10*3/uL (ref 0.0–0.2)
Basos: 2 %
EOS (ABSOLUTE): 0.2 10*3/uL (ref 0.0–0.4)
Eos: 5 %
Hematocrit: 39.1 % (ref 34.0–46.6)
Hemoglobin: 13.7 g/dL (ref 11.1–15.9)
Immature Grans (Abs): 0 10*3/uL (ref 0.0–0.1)
Immature Granulocytes: 0 %
Lymphocytes Absolute: 1.4 10*3/uL (ref 0.7–3.1)
Lymphs: 40 %
MCH: 30.4 pg (ref 26.6–33.0)
MCHC: 35 g/dL (ref 31.5–35.7)
MCV: 87 fL (ref 79–97)
Monocytes Absolute: 0.2 10*3/uL (ref 0.1–0.9)
Monocytes: 7 %
Neutrophils Absolute: 1.7 10*3/uL (ref 1.4–7.0)
Neutrophils: 46 %
Platelets: 155 10*3/uL (ref 150–450)
RBC: 4.51 x10E6/uL (ref 3.77–5.28)
RDW: 12.7 % (ref 11.7–15.4)
WBC: 3.5 10*3/uL (ref 3.4–10.8)

## 2021-06-25 LAB — BASIC METABOLIC PANEL
BUN/Creatinine Ratio: 13 (ref 9–23)
BUN: 12 mg/dL (ref 6–24)
CO2: 24 mmol/L (ref 20–29)
Calcium: 9.1 mg/dL (ref 8.7–10.2)
Chloride: 102 mmol/L (ref 96–106)
Creatinine, Ser: 0.89 mg/dL (ref 0.57–1.00)
Glucose: 64 mg/dL — ABNORMAL LOW (ref 70–99)
Potassium: 3.9 mmol/L (ref 3.5–5.2)
Sodium: 140 mmol/L (ref 134–144)
eGFR: 80 mL/min/{1.73_m2} (ref >59)

## 2021-06-25 LAB — FERRITIN: Ferritin: 44 ng/mL (ref 15–150)

## 2021-06-25 LAB — VITAMIN D 25 HYDROXY (VIT D DEFICIENCY, FRACTURES): Vit D, 25-Hydroxy: 39 ng/mL (ref 30.0–100.0)

## 2021-06-25 LAB — LIPID PANEL
Chol/HDL Ratio: 2.5 ratio (ref 0.0–4.4)
Cholesterol, Total: 184 mg/dL (ref 100–199)
HDL: 75 mg/dL (ref >39)
LDL Chol Calc (NIH): 98 mg/dL (ref 0–99)
Triglycerides: 55 mg/dL (ref 0–149)
VLDL Cholesterol Cal: 11 mg/dL (ref 5–40)

## 2021-06-25 LAB — TSH: TSH: 4.41 u[IU]/mL (ref 0.450–4.500)

## 2021-06-27 LAB — CBC WITH DIFFERENTIAL/PLATELET
Basophils Absolute: 0.1 10*3/uL (ref 0.0–0.2)
Basos: 2 %
EOS (ABSOLUTE): 0.2 10*3/uL (ref 0.0–0.4)
Eos: 4 %
Hematocrit: 38 % (ref 34.0–46.6)
Hemoglobin: 13.2 g/dL (ref 11.1–15.9)
Immature Grans (Abs): 0 10*3/uL (ref 0.0–0.1)
Immature Granulocytes: 0 %
Lymphocytes Absolute: 1.5 10*3/uL (ref 0.7–3.1)
Lymphs: 40 %
MCH: 29.3 pg (ref 26.6–33.0)
MCHC: 34.7 g/dL (ref 31.5–35.7)
MCV: 84 fL (ref 79–97)
Monocytes Absolute: 0.2 10*3/uL (ref 0.1–0.9)
Monocytes: 6 %
Neutrophils Absolute: 1.8 10*3/uL (ref 1.4–7.0)
Neutrophils: 48 %
Platelets: 160 10*3/uL (ref 150–450)
RBC: 4.51 x10E6/uL (ref 3.77–5.28)
RDW: 12.5 % (ref 11.7–15.4)
WBC: 3.7 10*3/uL (ref 3.4–10.8)

## 2021-06-27 LAB — COMPREHENSIVE METABOLIC PANEL
ALT: 32 IU/L (ref 0–32)
AST: 36 IU/L (ref 0–40)
Albumin/Globulin Ratio: 2.1 (ref 1.2–2.2)
Albumin: 4.4 g/dL (ref 3.8–4.8)
Alkaline Phosphatase: 96 IU/L (ref 44–121)
BUN/Creatinine Ratio: 13 (ref 9–23)
BUN: 12 mg/dL (ref 6–24)
Bilirubin Total: 0.6 mg/dL (ref 0.0–1.2)
CO2: 24 mmol/L (ref 20–29)
Calcium: 9.1 mg/dL (ref 8.7–10.2)
Chloride: 105 mmol/L (ref 96–106)
Creatinine, Ser: 0.89 mg/dL (ref 0.57–1.00)
Globulin, Total: 2.1 g/dL (ref 1.5–4.5)
Glucose: 62 mg/dL — ABNORMAL LOW (ref 70–99)
Potassium: 3.9 mmol/L (ref 3.5–5.2)
Sodium: 143 mmol/L (ref 134–144)
Total Protein: 6.5 g/dL (ref 6.0–8.5)
eGFR: 80 mL/min/{1.73_m2} (ref >59)

## 2021-06-27 LAB — QUANTIFERON-TB GOLD PLUS
QuantiFERON Mitogen Value: >10 IU/mL
QuantiFERON Nil Value: 0.1 IU/mL
QuantiFERON TB1 Ag Value: 0.08 IU/mL
QuantiFERON TB2 Ag Value: 0.07 IU/mL
QuantiFERON-TB Gold Plus: NEGATIVE

## 2021-06-27 LAB — HEPATITIS B SURFACE ANTIBODY,QUALITATIVE: Hep B Surface Ab, Qual: REACTIVE

## 2021-06-27 LAB — HIV ANTIBODY (ROUTINE TESTING W REFLEX): HIV Screen 4th Generation wRfx: NONREACTIVE

## 2021-06-27 LAB — HEPATITIS C ANTIBODY: Hep C Virus Ab: <0.1 s/co ratio (ref 0.0–0.9)

## 2021-06-27 LAB — HEPATITIS B SURFACE ANTIGEN: Hepatitis B Surface Ag: NEGATIVE

## 2021-06-29 ENCOUNTER — Telehealth: Payer: Self-pay

## 2021-06-29 MED ORDER — SKYRIZI PEN 150 MG/ML ~~LOC~~ SOAJ: 150.0000 mg | SUBCUTANEOUS | 5 refills | Status: DC

## 2021-06-29 MED ORDER — SKYRIZI PEN 150 MG/ML ~~LOC~~ SOAJ: 150.0000 mg | SUBCUTANEOUS | 1 refills | Status: DC

## 2021-06-29 NOTE — Telephone Encounter (Signed)
-----   Message from Brendolyn Patty, MD sent at 06/29/2021  9:05 AM EST ----- Baseline labs normal, TB neg. HIV neg, Hep B c/w immunity, Hep C neg, okay to send in RX for [employer]- please call patient

## 2021-06-29 NOTE — Telephone Encounter (Signed)
Advised patient labs were ok and Skyrizi loading and maintenance prescription will be sent to [place].

## 2021-06-30 ENCOUNTER — Encounter: Payer: Self-pay | Admitting: Internal Medicine

## 2021-06-30 ENCOUNTER — Ambulatory Visit: Payer: Managed Care, Other (non HMO) | Admitting: Dermatology

## 2021-06-30 ENCOUNTER — Other Ambulatory Visit: Payer: Self-pay

## 2021-06-30 DIAGNOSIS — L409 Psoriasis, unspecified: Secondary | ICD-10-CM | POA: Diagnosis not present

## 2021-06-30 DIAGNOSIS — L719 Rosacea, unspecified: Secondary | ICD-10-CM | POA: Diagnosis not present

## 2021-06-30 MED ORDER — DOXYCYCLINE MONOHYDRATE 50 MG PO TABS: ORAL_TABLET | ORAL | 2 refills | Status: DC

## 2021-06-30 NOTE — Patient Instructions (Addendum)
Recommend Head and Shoulders Clinical Strength for scalp and leave for several minutes before rinsing  Continue  Vtama Cream Apply to AA psoriasis face/body/axilla  Bring Skyrizi injection with you to next visit   Doxycycline should be taken with food to prevent nausea. Do not lay down for 30 minutes after taking. Be cautious with sun exposure and use good sun protection while on this medication. Pregnant women should not take this medication.      If You Need Anything After Your Visit  If you have any questions or concerns for your doctor, please call our main line at [phone] and press option 4 to reach your doctor's medical assistant. If no one answers, please leave a voicemail as directed and we will return your call as soon as possible. Messages left after 4 pm will be answered the following business day.   You may also send [place] a message via [place]. We typically respond to MyChart messages within 1-2 business days.  For prescription refills, please ask your pharmacy to contact our office. Our fax number is [phone].  If you have an urgent issue when the clinic is closed that cannot wait until the next business day, you can page your doctor at the number below.    Please note that while we do our best to be available for urgent issues outside of office hours, we are not available 24/7.   If you have an urgent issue and are unable to reach [place], you may choose to seek medical care at your doctor's office, retail clinic, urgent care center, or emergency room.  If you have a medical emergency, please immediately call 911 or go to the emergency department.  Pager Numbers  - Dr. Nehemiah Massed: [phone]  - Dr. Laurence Ferrari: [phone]  - Dr. Nicole Kindred: [phone]  In the event of inclement weather, please call our main line at [phone] for an update on the status of any delays or closures.  Dermatology Medication Tips: Please keep the boxes that topical medications come in in  order to help keep track of the instructions about where and how to use these. Pharmacies typically print the medication instructions only on the boxes and not directly on the medication tubes.   If your medication is too expensive, please contact our office at [phone] option 4 or send [place] a message through [place].   We are unable to tell what your co-pay for medications will be in advance as this is different depending on your insurance coverage. However, we may be able to find a substitute medication at lower cost or fill out paperwork to get insurance to cover a needed medication.   If a prior authorization is required to get your medication covered by your insurance company, please allow [place] 1-2 business days to complete this process.  Drug prices often vary depending on where the prescription is filled and some pharmacies may offer cheaper prices.  The website www.goodrx.com contains coupons for medications through different pharmacies. The prices here do not account for what the cost may be with help from insurance (it may be cheaper with your insurance), but the website can give you the price if you did not use any insurance.  - You can print the associated coupon and take it with your prescription to the pharmacy.  - You may also stop by our office during regular business hours and pick up a GoodRx coupon card.  - If you need your prescription sent electronically to a different pharmacy, notify our  office through [facility] or by phone at [phone] option 4.     Si Usted Necesita Algo Despus de Su Visita  Tambin puede enviarnos un mensaje a travs de Pharmacist, community. Por lo general respondemos a los mensajes de MyChart en el transcurso de 1 a 2 das hbiles.  Para renovar recetas, por favor pida a su farmacia que se ponga en contacto con nuestra oficina. Harland Dingwall de fax es [place] [phone].  Si tiene un asunto urgente cuando la clnica est cerrada y que no puede  esperar hasta el siguiente da hbil, puede llamar/localizar a su doctor(a) al nmero que aparece a continuacin.   Por favor, tenga en cuenta que aunque hacemos todo lo posible para estar disponibles para asuntos urgentes fuera del horario de [place], no estamos disponibles las 24 horas del da, los 7 das de la [place].   Si tiene un problema urgente y no puede comunicarse con nosotros, puede optar por buscar atencin mdica  en el consultorio de su doctor(a), en una clnica privada, en un centro de atencin urgente o en una sala de emergencias.  Si tiene Engineering geologist, por favor llame inmediatamente al 911 o vaya a la sala de emergencias.  Nmeros de bper  - Dr. Nehemiah Massed: [phone]  - Dra. Moye: [phone]  - Dra. Nicole Kindred: [phone]  En caso de inclemencias del [place], por favor llame a Johnsie Kindred principal al [phone] para una actualizacin sobre el [place] de cualquier retraso o cierre.  Consejos para la medicacin en dermatologa: Por favor, guarde las cajas en las que vienen los medicamentos de uso tpico para ayudarle a seguir las instrucciones sobre dnde y cmo usarlos. Las farmacias generalmente imprimen las instrucciones del medicamento slo en las cajas y no directamente en los tubos del [place].   Si su medicamento es muy caro, por favor, pngase en contacto con Zigmund Daniel llamando al [phone] y presione la opcin 4 o envenos un mensaje a travs de Pharmacist, community.   No podemos decirle cul ser su copago por los medicamentos por adelantado ya que esto es diferente dependiendo de la cobertura de su seguro. Sin embargo, es posible que podamos encontrar un medicamento sustituto a Electrical engineer un formulario para que el seguro cubra el medicamento que se considera necesario.   Si se requiere una autorizacin previa para que su compaa de seguros [place] su medicamento, por favor permtanos de 1 a 2 das hbiles para completar este proceso.  Los  precios de los medicamentos varan con frecuencia dependiendo del Environmental consultant de dnde se surte la receta y alguna farmacias pueden ofrecer precios ms baratos.  El sitio web www.goodrx.com tiene cupones para medicamentos de Airline pilot. Los precios aqu no tienen en cuenta lo que podra costar con la ayuda del seguro (puede ser ms barato con su seguro), pero el sitio web puede darle el precio si no utiliz Research scientist (physical sciences).  - Puede imprimir el cupn correspondiente y llevarlo con su receta a la farmacia.  - Tambin puede pasar por nuestra oficina durante el horario de atencin regular y Charity fundraiser una tarjeta de cupones de GoodRx.  - Si necesita que su receta se enve electrnicamente a una farmacia diferente, informe a nuestra oficina a travs de MyChart de  o por telfono llamando al [phone] y presione la opcin 4.

## 2021-06-30 NOTE — Progress Notes (Signed)
Follow-Up Visit   Subjective  Carol Mahoney is a 46 y.o. female who presents for the following: Follow-up (Patient here today for 1 month follow up on psoriasis. Patient is currently using vtama cream and awaiting starting skyrizi injections. Patient also reports that her rosacea medication doxycyline 50 mg caused a reaction and patient had difficult time swallowing 15 to 20 minutes after taking. ).  Feels like something was stuck in esophagus so stopped taking it.  Patient has had psoriasis since childhood and tried multiple topical treatments (shampoos, topical steroids), none of which have worked.  She recently tried [place], but was unable to tolerate due to GI effects.  Patient would also like to discuss side effects of taking Skyrizi and interactions to Mavacamten a new heart medication patient will begin taking.  Patient has history of hypertrophic obstructive cardiomyopathy and is concerned about taking mavacamten while taking Skyrizi.   The following portions of the chart were reviewed this encounter and updated as appropriate:      Review of Systems: No other skin or systemic complaints except as noted in HPI or Assessment and Plan.   Objective  Well appearing patient in no apparent distress; mood and affect are within normal limits.  A focused examination was performed including scalp, ears, face, arms. Relevant physical exam findings are noted in the Assessment and Plan.  Scalp Diffuse scaling at scalp, mild erythema and scale at ear canals,   Head - Anterior (Face) Few scattered inflammatory papules on cheeks and chin, mild erythema on nose    Assessment & Plan  Psoriasis Scalp  Severe scalp involvement, not responding to topical treatment Chronic condition with duration or expected duration over one year. Condition is bothersome to patient. Currently flared.    Psoriasis is a chronic non-curable, but treatable genetic/hereditary disease that may have other systemic  features affecting other organ systems such as joints (Psoriatic Arthritis). It is associated with an increased risk of inflammatory bowel disease, heart disease, non-alcoholic fatty liver disease, and depression.    Patient has had psoriasis many years and tried multiple treatments without success. Patient deferred taking a new topical for scalp at this time.  Recommend  otc Head and Shoulders Clinical Strength for scalp and leave for several minutes before rinsing  Continue  Vtama Cream Apply to AA psoriasis face/body/axilla Will start Skyrizi once approved with insurance- Labs already checked and Rx has been sent in to specialty pharmacy.  Discussed skyrizi risk/benefits, low risk of infection, doubt any interactions with new heart medication mavacamten since both medications have very specific/unique mechanisms of action.  Patient reports she would prefer syringe instead of pen for skyrizi, she is also interested in administering her own injections    Related Medications Tapinarof (VTAMA) 1 % CREA Apply 1 application topically daily. Apply to affected areas face, underarms, body once a day for psoriasis.  Rosacea Head - Anterior (Face)  Chronic condition with duration or expected duration over one year. Condition is bothersome to patient. Improving but not currently at goal.   Rosacea is a chronic progressive skin condition usually affecting the face of adults, causing redness and/or acne bumps. It is treatable but not curable. It sometimes affects the eyes (ocular rosacea) as well. It may respond to topical and/or systemic medication and can flare with stress, sun exposure, alcohol, exercise and some foods.  Daily application of broad spectrum spf 30+ sunscreen to face is recommended to reduce flares.  Continue Soolantra 1 % cream - apply  to face qhs for rosacea  D/C doxy hyclate due to problems swallowing. Start doxycycline mono (adoxa) 50 mg take by mouth 1 - 2 times daily with  food and plenty of water.  Stay upright after swallowing pill for at least 30 minutes. Do not lay down.  Doxycycline should be taken with food to prevent nausea. Do not lay down for 30 minutes after taking. Be cautious with sun exposure and use good sun protection while on this medication. Pregnant women should not take this medication.       doxycycline (ADOXA) 50 MG tablet - Head - Anterior (Face) Take 1 tablet by mouth 1 - 2 times daily with food. Do not lay down for 30 minutes after taking. Be cautious with sun exposure and use good sun protection while on this medication.  Related Medications Ivermectin (SOOLANTRA) 1 % CREA Apply to face qhs for rosacea.   Return for 2 month follow up psoriasis . I, Ruthell Rummage, CMA, am acting as scribe for Brendolyn Patty, MD.  Documentation: I have reviewed the above documentation for accuracy and completeness, and I agree with the above.  Brendolyn Patty MD

## 2021-07-06 ENCOUNTER — Telehealth: Payer: Self-pay

## 2021-07-06 NOTE — Telephone Encounter (Signed)
LMTCB FOR LABS

## 2021-07-06 NOTE — Telephone Encounter (Signed)
-----   Message from Einar Pheasant, MD sent at 07/06/2021  5:13 AM EST ----- Carol Mahoney that her cholesterol levels are ok.  One liver test is slightly elevated.  The remainder of her liver function is wnl.  She has a f/u with Dr Allen Norris on 07/13/21 to discuss f/u CT and can f/u regarding elevated liver enzymes with him then as well.  Her white blood cell count, hemoglobin, platelet count, vitamin D level and kidney function tests are wnl.  Her thyroid test is wnl, but at upper limits of normal.  Confirm taking synthroid 126mcg daily and correctly.  Recheck tsh in 6 weeks to confirm wnl.

## 2021-07-07 ENCOUNTER — Other Ambulatory Visit: Payer: Self-pay

## 2021-07-07 DIAGNOSIS — E039 Hypothyroidism, unspecified: Secondary | ICD-10-CM

## 2021-07-12 NOTE — Progress Notes (Signed)
Primary Care Physician: Einar Pheasant, MD  Primary Gastroenterologist:  Dr. Lucilla Lame  Chief Complaint  Patient presents with   Follow-up    HPI: Carol Mahoney is a 46 y.o. female here after being seen by me in 2021 for diarrhea and abdominal pain.  The patient had a CT scan of the abdomen that showed:  IMPRESSION: 1. Prior gastric bypass. The small gastric pouch it is partially herniated into the chest with a suggestion of partially un wrapped fundoplication. 2. Full thickening in the jejunum is nonspecific but could reflect inflammation. There is also some mild smooth wall thickening in the descending and sigmoid colon which could reflect low-grade inflammation. 3. Normal non-contrast appearance of the pancreas common no pseudocyst. 4. Small fluid density structure in the right perirectal space is substantially reduced in size compared to 03/06/2011, and likely benign/incidental.   The patient was also noted to have abnormal liver enzymes with her most recent labs being normal. The patient's primary care provider reported no abdominal pain reported at the patient's last visit in January to her PCPs office. Lotrenex was helping but then the insurance stopped covering it. The patient also reports that she is out of Lomotil.  The patient denies any unexplained weight loss fevers chills nausea or vomiting but states that she continues to have the same GI symptoms of diarrhea and irritable bowel syndrome that she is always had.  Past Medical History:  Diagnosis Date   Anemia    Anxiety    Depression    GERD (gastroesophageal reflux disease)    H/O hiatal hernia    Hypertension    Hypertr obst cardiomyop    mitral regurgitation, LAE - followed at [facility]   Hypothyroidism    Migraine headache    PONV (postoperative nausea and vomiting)     Current Outpatient Medications  Medication Sig Dispense Refill   ALPRAZolam (XANAX) 0.5 MG tablet 0.5 mg 3 (three) times daily as needed.       Cholecalciferol 25 MCG (1000 UT) capsule Take 1,000 Units by mouth daily.     clobetasol cream (TEMOVATE) 0.05 % Apply to affected area on colored side of ttoo on left forearm once to twice daily for itch. Avoid Face, groin, and underarm 30 g 2   cyclobenzaprine (FLEXERIL) 5 MG tablet Take 1 tablet (5 mg total) by mouth at bedtime as needed for muscle spasms. 30 tablet 0   diphenoxylate-atropine (LOMOTIL) 2.5-0.025 MG tablet Take 1 tablet by mouth 4 (four) times daily as needed for diarrhea or loose stools. 120 tablet 5   doxycycline (ADOXA) 50 MG tablet Take 1 tablet by mouth 1 - 2 times daily with food. Do not lay down for 30 minutes after taking. Be cautious with sun exposure and use good sun protection while on this medication. 60 tablet 2   EPINEPHrine (EPIPEN 2-PAK) 0.3 mg/0.3 mL IJ SOAJ injection USE AS DIRECTED ON PACKAGE 2 each 0   hydrocortisone (ANUSOL-HC) 25 MG suppository Place 1 suppository (25 mg total) rectally at bedtime. 12 suppository 0   Ivermectin (SOOLANTRA) 1 % CREA Apply to face qhs for rosacea. 45 g 5   levothyroxine (SYNTHROID) 100 MCG tablet Take 1 tablet (100 mcg total) by mouth daily before breakfast. 90 tablet 1   ondansetron (ZOFRAN) 4 MG tablet Take 1 tablet (4 mg total) by mouth 2 (two) times daily as needed for nausea or vomiting. 20 tablet 0   pantoprazole (PROTONIX) 40 MG tablet TAKE 1 TABLET  BY MOUTH  DAILY 90 tablet 3   Risankizumab-rzaa (SKYRIZI PEN) 150 MG/ML SOAJ Inject 150 mg into the skin as directed. At weeks 0 & 4. 1 mL 1   Risankizumab-rzaa (SKYRIZI PEN) 150 MG/ML SOAJ Inject 150 mg into the skin as directed. Every 12 weeks for maintenance. 1 mL 5   SUMAtriptan (IMITREX) 50 MG tablet Take 1 tablet by mouth  every 2 hours as needed for migraine(s) 54 tablet 2   tacrolimus (PROTOPIC) 0.1 % ointment      Tapinarof (VTAMA) 1 % CREA Apply 1 application topically daily. Apply to affected areas face, underarms, body once a day for psoriasis. 60 g 3    TRINTELLIX 20 MG TABS tablet TK 1 T PO QAM     vitamin B-12 (CYANOCOBALAMIN) 1000 MCG tablet Take 1 tablet (1,000 mcg total) by mouth daily. 30 tablet 3   Mavacamten (CAMZYOS) 5 MG CAPS Take by mouth. (Patient not taking: Reported on 07/13/2021)     No current facility-administered medications for this visit.    Allergies as of 07/13/2021 - Review Complete 07/13/2021  Allergen Reaction Noted   Carafate [sucralfate] Rash and Hives 04/19/2011   Chloraprep one step [chlorhexidine gluconate] Itching and Hives 10/14/2011   Shellfish-derived products Swelling 09/04/2015   Vancomycin Itching and Dermatitis 10/20/2011   Penicillins Hives and Rash 10/14/2011   Sulfa antibiotics Hives and Rash 10/14/2011   Doxycycline Other (See Comments) 06/30/2021   Soap  02/01/2011   Trimethoprim  02/01/2011   Iodine Rash 10/20/2011    ROS:  General: Negative for anorexia, weight loss, fever, chills, fatigue, weakness. ENT: Negative for hoarseness, difficulty swallowing , nasal congestion. CV: Negative for chest pain, angina, palpitations, dyspnea on exertion, peripheral edema.  Respiratory: Negative for dyspnea at rest, dyspnea on exertion, cough, sputum, wheezing.  GI: See history of present illness. GU:  Negative for dysuria, hematuria, urinary incontinence, urinary frequency, nocturnal urination.  Endo: Negative for unusual weight change.    Physical Examination:   BP 107/73    Pulse (!) 57    Temp 98.3 F (36.8 C) (Oral)    Wt 145 lb (65.8 kg)    LMP 10/08/2011    BMI 22.71 kg/m   General: Well-nourished, well-developed in no acute distress.  Eyes: No icterus. Conjunctivae pink. Lungs: Clear to auscultation bilaterally. Non-labored. Heart: Regular rate and rhythm, positve murmurs rubs or gallops.  Abdomen: Bowel sounds are normal, nontender, nondistended, no hepatosplenomegaly or masses, no abdominal bruits or hernia , no rebound or guarding.   Extremities: No lower extremity edema. No clubbing  or deformities. Neuro: Alert and oriented x 3.  Grossly intact. Skin: Warm and dry, no jaundice.   Psych: Alert and cooperative, normal mood and affect.  Labs:    Imaging Studies: No results found.  Assessment and Plan:   Carol Mahoney is a 46 y.o. y/o female who comes in today with a history of some left-sided abdominal pain with a history of gastric bypass and irritable bowel syndrome.  The patient continues to have diarrhea.  The patient had a CT scan that showed abnormal jejunum.  The patient will be set up for screening colonoscopy and upper endoscopy to look for any small bowel abnormalities on the upper endoscopy.  The patient will also have her Lomotil refilled.  The patient has also been told that she should have her stool sent off for fecal calprotectin to look for inflammatory bowel disease.  The patient has been explained the plan agrees  with it.     Lucilla Lame, MD. Marval Regal    Note: This dictation was prepared with Dragon dictation along with smaller phrase technology. Any transcriptional errors that result from this process are unintentional.

## 2021-07-13 ENCOUNTER — Ambulatory Visit (INDEPENDENT_AMBULATORY_CARE_PROVIDER_SITE_OTHER): Payer: Managed Care, Other (non HMO) | Admitting: Gastroenterology

## 2021-07-13 ENCOUNTER — Other Ambulatory Visit: Payer: Self-pay

## 2021-07-13 ENCOUNTER — Encounter: Payer: Self-pay | Admitting: Gastroenterology

## 2021-07-13 VITALS — BP 107/73 | HR 57 | Temp 98.3°F | Wt 145.0 lb

## 2021-07-13 DIAGNOSIS — R109 Unspecified abdominal pain: Secondary | ICD-10-CM | POA: Diagnosis not present

## 2021-07-13 DIAGNOSIS — R197 Diarrhea, unspecified: Secondary | ICD-10-CM

## 2021-07-13 MED ORDER — DIPHENOXYLATE-ATROPINE 2.5-0.025 MG PO TABS: 1.0000 | ORAL_TABLET | Freq: Four times a day (QID) | ORAL | 5 refills | Status: DC | PRN

## 2021-07-13 MED ORDER — NA SULFATE-K SULFATE-MG SULF 17.5-3.13-1.6 GM/177ML PO SOLN: 1.0000 | Freq: Once | ORAL | 0 refills | Status: AC

## 2021-07-13 NOTE — Addendum Note (Signed)
Addended by: Lurlean Nanny on: 07/13/2021 04:04 PM   Modules accepted: Orders

## 2021-07-14 MED ORDER — SUTAB 1479-225-188 MG PO TABS: ORAL_TABLET | ORAL | 0 refills | Status: DC

## 2021-07-14 NOTE — Addendum Note (Signed)
Addended by: Lurlean Nanny on: 07/14/2021 08:27 AM   Modules accepted: Orders

## 2021-07-20 ENCOUNTER — Encounter: Payer: Self-pay | Admitting: Dermatology

## 2021-07-21 LAB — CALPROTECTIN, FECAL: Calprotectin, Fecal: 55 ug/g (ref 0–120)

## 2021-07-29 ENCOUNTER — Encounter: Payer: Self-pay | Admitting: Gastroenterology

## 2021-07-30 ENCOUNTER — Encounter: Payer: Self-pay | Admitting: Dermatology

## 2021-08-06 ENCOUNTER — Ambulatory Visit: Payer: Managed Care, Other (non HMO)

## 2021-08-10 ENCOUNTER — Ambulatory Visit (INDEPENDENT_AMBULATORY_CARE_PROVIDER_SITE_OTHER): Payer: Managed Care, Other (non HMO) | Admitting: Dermatology

## 2021-08-10 ENCOUNTER — Other Ambulatory Visit: Payer: Self-pay

## 2021-08-10 DIAGNOSIS — L409 Psoriasis, unspecified: Secondary | ICD-10-CM | POA: Diagnosis not present

## 2021-08-10 NOTE — Progress Notes (Signed)
Patient here today to start Skyrizi injections.  ?Skyrizi '150mg'$  SELF injected into right lower abdomen. Patient tolerated well. I did not touch patient for injection. ?Patient use to do other injections on her own and feels comfortable doing Skyrizi herself.  ? ?Answered all questions. ? ?Patient to follow up with Dr. Nicole Kindred in four weeks as a psoriasis follow up and week 4 Skyrizi injection.  ? ?Patient's Skyrizi: LOT: [id number] ?EXP: AUG 2024 ? ?Johnsie Kindred, RMA ? ?Documentation: I have reviewed the above documentation for accuracy and completeness, and I agree with the above. ? ?Brendolyn Patty MD  ? ? ?

## 2021-08-24 ENCOUNTER — Ambulatory Visit: Payer: Managed Care, Other (non HMO) | Admitting: Dermatology

## 2021-09-05 ENCOUNTER — Encounter: Payer: Self-pay | Admitting: Gastroenterology

## 2021-09-07 ENCOUNTER — Ambulatory Visit (INDEPENDENT_AMBULATORY_CARE_PROVIDER_SITE_OTHER): Payer: Managed Care, Other (non HMO) | Admitting: Dermatology

## 2021-09-07 DIAGNOSIS — L409 Psoriasis, unspecified: Secondary | ICD-10-CM | POA: Diagnosis not present

## 2021-09-07 DIAGNOSIS — L719 Rosacea, unspecified: Secondary | ICD-10-CM

## 2021-09-07 DIAGNOSIS — L299 Pruritus, unspecified: Secondary | ICD-10-CM

## 2021-09-07 DIAGNOSIS — Z79899 Other long term (current) drug therapy: Secondary | ICD-10-CM

## 2021-09-07 MED ORDER — CLOBETASOL PROPIONATE 0.05 % EX SOLN: 1.0000 "application " | Freq: Two times a day (BID) | CUTANEOUS | 1 refills | Status: DC

## 2021-09-07 NOTE — Patient Instructions (Signed)

## 2021-09-07 NOTE — Progress Notes (Signed)
? ?Follow-Up Visit ?  ?Subjective  ?Carol Mahoney is a 46 y.o. female who presents for the following: Psoriasis (Scalp, 4wk f/u Skyrizi start 08/10/21, not much improvement), Pruritis (R buttocks, 2wks, itchy, tried Benadryl and OTC HC), and Rosacea (Face, Soolantra prn, Doxycycline '50mg'$  1 po prn).  Doesn't use the rosacea medications regularly since she has improved.  No side effects noticed from the first Syrizi injection. ? ? ?The following portions of the chart were reviewed this encounter and updated as appropriate:  ?  ?  ? ?Review of Systems:  No other skin or systemic complaints except as noted in HPI or Assessment and Plan. ? ?Objective  ?Well appearing patient in no apparent distress; mood and affect are within normal limits. ? ?A focused examination was performed including scalp, buttocks. Relevant physical exam findings are noted in the Assessment and Plan. ? ?Scalp ?Diffuse mild scaling scalp ? ?R medial buttocks ?Pink excoriations R medial buttocks ? ?face ?Erythema nose, inflammatory paps nose and L medial cheek ? ? ? ?Assessment & Plan  ?Psoriasis ?Scalp ? ?Chronic and persistent condition with duration or expected duration over one year. Condition is symptomatic/ bothersome to patient. Not currently at goal.  Pt has had one Skyrizi injection, and today will be her second injection (4 wks). ? ?Psoriasis - severe on systemic ?biologic? treatment injections.  Psoriasis is a chronic non-curable, but treatable genetic/hereditary disease that may have other systemic features affecting other organ systems such as joints (Psoriatic Arthritis).  It is linked with heart disease, inflammatory bowel disease, non-alcoholic fatty liver disease, and depression. Significant skin psoriasis and/or psoriatic arthritis may have significant symptoms and affects activities of daily activity and often benefits from systemic ?biologic? injection treatments.  These ?biologic? treatments have some potential side effects  including immunosuppression and require pre-treatment laboratory screening and periodic laboratory monitoring and periodic in person evaluation and monitoring by the attending dermatologist physician (long term medication management).  ? ?Cont Skyrizi sq injections today wk 4, then q 12 wks ?Pt brought her skyrizi and self injected to L lower abdomen. ?Start Clobetasol sol several gtts to aa scalp bid until clear, then prn flares, avoid f/g/a ? ?Reviewed risks of biologics including immunosuppression, infections, injection site reaction, and failure to improve condition. Goal is control of skin condition, not cure.  Some older biologics such as Humira and Enbrel may slightly increase risk of malignancy and may worsen congestive heart failure. The use of biologics requires long term medication management, including periodic office visits and monitoring of blood work.  ? ?Topical steroids (such as triamcinolone, fluocinolone, fluocinonide, mometasone, clobetasol, halobetasol, betamethasone, hydrocortisone) can cause thinning and lightening of the skin if they are used for too long in the same area. Your physician has selected the right strength medicine for your problem and area affected on the body. Please use your medication only as directed by your physician to prevent side effects.   ? ?clobetasol (TEMOVATE) 0.05 % external solution - Scalp ?Apply 1 application. topically 2 (two) times daily. Apply 1-2 gtts to aa scalp bid until clear, then prn flares, avoid face, groin, axilla ? ?Related Medications ?Tapinarof (VTAMA) 1 % CREA ?Apply 1 application topically daily. Apply to affected areas face, underarms, body once a day for psoriasis. ? ?Pruritus ?R medial buttocks ? 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 ?Start Clobetasol cr bid until clear, then prn flares, avoid f/g/a ? ? ? ?Rosacea ?face ? ?Improving, but not at goal ? ?Rosacea is a  chronic progressive skin condition usually affecting the  face of adults, causing redness and/or acne bumps. It is treatable but not curable. It sometimes affects the eyes (ocular rosacea) as well. It may respond to topical and/or systemic medication and can flare with stress, sun exposure, alcohol, exercise and some foods.  Daily application of broad spectrum spf 30+ sunscreen to face is recommended to reduce flares. ? ?Cont Soolantra increasing to qhs on a regular basis ?Cont Doxycycline '50mg'$  1 po qd prn flares ? ?Doxycycline should be taken with food to prevent nausea. Do not lay down for 30 minutes after taking. Be cautious with sun exposure and use good sun protection while on this medication. Pregnant women should not take this medication.   ? ?Related Medications ?Ivermectin (SOOLANTRA) 1 % CREA ?Apply to face qhs for rosacea. ? ?doxycycline (ADOXA) 50 MG tablet ?Take 1 tablet by mouth 1 - 2 times daily with food. Do not lay down for 30 minutes after taking. Be cautious with sun exposure and use good sun protection while on this medication. ? ? ?Return in about 4 months (around 01/07/2022) for Psoriasis f/u. ? ?I, Othelia Pulling, RMA, am acting as scribe for Brendolyn Patty, MD . ? ?Documentation: I have reviewed the above documentation for accuracy and completeness, and I agree with the above. ? ?Brendolyn Patty MD  ? ?

## 2021-09-10 ENCOUNTER — Encounter: Payer: Self-pay | Admitting: Gastroenterology

## 2021-09-10 ENCOUNTER — Ambulatory Visit: Payer: Managed Care, Other (non HMO) | Admitting: Certified Registered Nurse Anesthetist

## 2021-09-10 ENCOUNTER — Ambulatory Visit
Admission: RE | Admit: 2021-09-10 | Discharge: 2021-09-10 | Disposition: A | Discharge status: Home / Self Care | Payer: Managed Care, Other (non HMO) | Attending: Gastroenterology | Admitting: Gastroenterology

## 2021-09-10 ENCOUNTER — Encounter
Admission: RE | Disposition: A | Discharge status: Home / Self Care | Payer: Self-pay | Source: Home / Self Care | Attending: Gastroenterology

## 2021-09-10 DIAGNOSIS — R197 Diarrhea, unspecified: Secondary | ICD-10-CM

## 2021-09-10 DIAGNOSIS — I10 Essential (primary) hypertension: Secondary | ICD-10-CM | POA: Diagnosis not present

## 2021-09-10 DIAGNOSIS — K449 Diaphragmatic hernia without obstruction or gangrene: Secondary | ICD-10-CM | POA: Insufficient documentation

## 2021-09-10 DIAGNOSIS — K640 First degree hemorrhoids: Secondary | ICD-10-CM | POA: Diagnosis not present

## 2021-09-10 DIAGNOSIS — K5730 Diverticulosis of large intestine without perforation or abscess without bleeding: Secondary | ICD-10-CM | POA: Diagnosis not present

## 2021-09-10 DIAGNOSIS — I472 Ventricular tachycardia, unspecified: Secondary | ICD-10-CM | POA: Insufficient documentation

## 2021-09-10 DIAGNOSIS — R1084 Generalized abdominal pain: Secondary | ICD-10-CM | POA: Diagnosis not present

## 2021-09-10 DIAGNOSIS — Z980 Intestinal bypass and anastomosis status: Secondary | ICD-10-CM | POA: Insufficient documentation

## 2021-09-10 DIAGNOSIS — K219 Gastro-esophageal reflux disease without esophagitis: Secondary | ICD-10-CM | POA: Insufficient documentation

## 2021-09-10 DIAGNOSIS — E039 Hypothyroidism, unspecified: Secondary | ICD-10-CM | POA: Insufficient documentation

## 2021-09-10 DIAGNOSIS — F32A Depression, unspecified: Secondary | ICD-10-CM | POA: Diagnosis not present

## 2021-09-10 DIAGNOSIS — F419 Anxiety disorder, unspecified: Secondary | ICD-10-CM | POA: Insufficient documentation

## 2021-09-10 DIAGNOSIS — R109 Unspecified abdominal pain: Secondary | ICD-10-CM

## 2021-09-10 DIAGNOSIS — Z95810 Presence of automatic (implantable) cardiac defibrillator: Secondary | ICD-10-CM | POA: Insufficient documentation

## 2021-09-10 DIAGNOSIS — K529 Noninfective gastroenteritis and colitis, unspecified: Secondary | ICD-10-CM | POA: Insufficient documentation

## 2021-09-10 HISTORY — PX: COLONOSCOPY WITH PROPOFOL: SHX5780

## 2021-09-10 HISTORY — PX: ESOPHAGOGASTRODUODENOSCOPY: SHX5428

## 2021-09-10 SURGERY — COLONOSCOPY WITH PROPOFOL
Anesthesia: General

## 2021-09-10 MED ORDER — LIDOCAINE HCL (CARDIAC) PF 100 MG/5ML IV SOSY
PREFILLED_SYRINGE | INTRAVENOUS | Status: DC | PRN
Start: 2021-09-10 — End: 2021-09-10
  Administered 2021-09-10: 100 mg via INTRAVENOUS

## 2021-09-10 MED ORDER — ONDANSETRON HCL 4 MG/2ML IJ SOLN
INTRAMUSCULAR | Status: AC
  Filled 2021-09-10: qty 2

## 2021-09-10 MED ORDER — SODIUM CHLORIDE 0.9 % IV SOLN: INTRAVENOUS | Status: DC

## 2021-09-10 MED ORDER — PROPOFOL 10 MG/ML IV BOLUS
INTRAVENOUS | Status: DC | PRN
  Administered 2021-09-10: 20 mg via INTRAVENOUS
  Administered 2021-09-10: 80 mg via INTRAVENOUS

## 2021-09-10 MED ORDER — EPHEDRINE SULFATE (PRESSORS) 50 MG/ML IJ SOLN
INTRAMUSCULAR | Status: DC | PRN
  Administered 2021-09-10: 5 mg via INTRAVENOUS

## 2021-09-10 MED ORDER — ONDANSETRON HCL 4 MG/2ML IJ SOLN
INTRAMUSCULAR | Status: DC | PRN
  Administered 2021-09-10: 4 mg via INTRAVENOUS

## 2021-09-10 MED ORDER — EPHEDRINE 5 MG/ML INJ
INTRAVENOUS | Status: AC
  Filled 2021-09-10: qty 5

## 2021-09-10 MED ORDER — PROPOFOL 500 MG/50ML IV EMUL
INTRAVENOUS | Status: DC | PRN
  Administered 2021-09-10: 160 ug/kg/min via INTRAVENOUS

## 2021-09-10 NOTE — Anesthesia Procedure Notes (Signed)
Date/Time: 09/10/2021 8:35 AM ?Performed by: Demetrius Charity, CRNA ?Pre-anesthesia Checklist: Patient identified, Emergency Drugs available, Suction available, Patient being monitored and Timeout performed ?Patient Re-evaluated:Patient Re-evaluated prior to induction ?Oxygen Delivery Method: Nasal cannula ?Induction Type: IV induction ?Placement Confirmation: CO2 detector and positive ETCO2 ? ? ? ? ?

## 2021-09-10 NOTE — Anesthesia Postprocedure Evaluation (Signed)
Anesthesia Post Note ? ?Patient: Carol Mahoney ? ?Procedure(s) Performed: COLONOSCOPY WITH PROPOFOL ?ESOPHAGOGASTRODUODENOSCOPY (EGD) ? ?Patient location during evaluation: Endoscopy ?Anesthesia Type: General ?Level of consciousness: awake and alert ?Pain management: pain level controlled ?Vital Signs Assessment: post-procedure vital signs reviewed and stable ?Respiratory status: spontaneous breathing, nonlabored ventilation, respiratory function stable and patient connected to nasal cannula oxygen ?Cardiovascular status: blood pressure returned to baseline and stable ?Postop Assessment: no apparent nausea or vomiting ?Anesthetic complications: no ? ? ?No notable events documented. ? ? ?Last Vitals:  ?Vitals:  ? 09/10/21 0920 09/10/21 0930  ?BP: (!) 102/53 110/80  ?Pulse: 61 64  ?Resp: 18 18  ?Temp:    ?SpO2: 100% 100%  ?  ?Last Pain:  ?Vitals:  ? 09/10/21 0900  ?TempSrc: Temporal  ?PainSc:   ? ? ?  ?  ?  ?  ?  ?  ? ?Martha Clan ? ? ? ? ?

## 2021-09-10 NOTE — Transfer of Care (Addendum)
Immediate Anesthesia Transfer of Care Note ? ?Patient: Carol Mahoney ? ?Procedure(s) Performed: COLONOSCOPY WITH PROPOFOL ?ESOPHAGOGASTRODUODENOSCOPY (EGD) ? ?Patient Location: PACU ? ?Anesthesia Type:General ? ?Level of Consciousness: drowsy ? ?Airway & Oxygen Therapy: Patient Spontanous Breathing ? ?Post-op Assessment: Report given to RN and Post -op Vital signs reviewed and stable ? ?Post vital signs: Reviewed and stable ? ?Last Vitals:  ?Vitals Value Taken Time  ?BP 95/56   ?Temp 36 ?C 09/10/21 0900  ?Pulse 80 09/10/21 0905  ?Resp 14 09/10/21 0905  ?SpO2 100 % 09/10/21 0905  ?Vitals shown include unvalidated device data. ? ?Last Pain:  ?Vitals:  ? 09/10/21 0900  ?TempSrc: Temporal  ?PainSc:   ?   ? ?  ? ?Complications: No notable events documented. ?

## 2021-09-10 NOTE — Anesthesia Preprocedure Evaluation (Signed)
Anesthesia Evaluation  ?Patient identified by MRN, date of birth, ID band ?Patient awake ? ? ? ?Reviewed: ?Allergy & Precautions, H&P , NPO status , Patient's Chart, lab work & pertinent test results, reviewed documented beta blocker date and time  ? ?History of Anesthesia Complications ?(+) PONV and history of anesthetic complications ? ?Airway ?Mallampati: I ? ?TM Distance: >3 FB ?Neck ROM: full ? ? ? Dental ? ?(+) Dental Advidsory Given, Caps, Teeth Intact ?  ?Pulmonary ?neg pulmonary ROS,  ?  ?Pulmonary exam normal ?breath sounds clear to auscultation ? ? ? ? ? ? Cardiovascular ?Exercise Tolerance: Good ?hypertension, (-) angina(-) Past MI and (-) Cardiac Stents + dysrhythmias Ventricular Tachycardia + Cardiac Defibrillator ?+ Valvular Problems/Murmurs  ?Rhythm:regular Rate:Normal ? ?Hypertrophic obstructive cardiomyopathy s/p surgery ?  ?Neuro/Psych ? Headaches, neg Seizures PSYCHIATRIC DISORDERS Anxiety Depression   ? GI/Hepatic ?Neg liver ROS, hiatal hernia, GERD  ,  ?Endo/Other  ?neg diabetesHypothyroidism  ? Renal/GU ?negative Renal ROS  ?negative genitourinary ?  ?Musculoskeletal ? ? Abdominal ?  ?Peds ? Hematology ?negative hematology ROS ?(+)   ?Anesthesia Other Findings ?Past Medical History: ?No date: Anemia ?No date: Anxiety ?No date: Depression ?No date: GERD (gastroesophageal reflux disease) ?No date: H/O hiatal hernia ?No date: Hypertension ?No date: Hypertr obst cardiomyop ?    Comment:  mitral regurgitation, LAE - followed at [facility] ?No date: Hypothyroidism ?No date: Migraine headache ?No date: PONV (postoperative nausea and vomiting) ? ? Reproductive/Obstetrics ?negative OB ROS ? ?  ? ? ? ? ? ? ? ? ? ? ? ? ? ?  ?  ? ? ? ? ? ? ? ? ?Anesthesia Physical ?Anesthesia Plan ? ?ASA: 3 ? ?Anesthesia Plan: General  ? ?Post-op Pain Management:   ? ?Induction: Intravenous ? ?PONV Risk Score and Plan: 4 or greater and Propofol infusion and TIVA ? ?Airway Management Planned:  Natural Airway ? ?Additional Equipment:  ? ?Intra-op Plan:  ? ?Post-operative Plan:  ? ?Informed Consent: I have reviewed the patients History and Physical, chart, labs and discussed the procedure including the risks, benefits and alternatives for the proposed anesthesia with the patient or authorized representative who has indicated his/her understanding and acceptance.  ? ? ? ?Dental Advisory Given ? ?Plan Discussed with: Anesthesiologist, CRNA and Surgeon ? ?Anesthesia Plan Comments:   ? ? ? ? ? ? ?Anesthesia Quick Evaluation ? ?

## 2021-09-10 NOTE — Op Note (Signed)
[facility] ?Gastroenterology ?Patient Name: Carol Mahoney ?Procedure Date: 09/10/2021 8:33 AM ?MRN: [id number] ?Account #: [id number] ?Date of Birth: 1975-10-27 ?Admit Type: Outpatient ?Age: 46 ?Room: [facility] ENDO ROOM 3 ?Gender: Female ?Note Status: Finalized ?Instrument Name: Upper Endoscope [id number] ?Procedure:             Upper GI endoscopy ?Indications:           Generalized abdominal pain, Diarrhea ?Providers:             Lucilla Lame MD, MD ?Referring MD:          Einar Pheasant, MD (Referring MD) ?Medicines:             Propofol per Anesthesia ?Complications:         No immediate complications. ?Procedure:             Pre-Anesthesia Assessment: ?                       - Prior to the procedure, a History and Physical was  ?                       performed, and patient medications and allergies were  ?                       reviewed. The patient's tolerance of previous  ?                       anesthesia was also reviewed. The risks and benefits  ?                       of the procedure and the sedation options and risks  ?                       were discussed with the patient. All questions were  ?                       answered, and informed consent was obtained. Prior  ?                       Anticoagulants: The patient has taken no previous  ?                       anticoagulant or antiplatelet agents. ASA Grade  ?                       Assessment: II - A patient with mild systemic disease.  ?                       After reviewing the risks and benefits, the patient  ?                       was deemed in satisfactory condition to undergo the  ?                       procedure. ?                       After obtaining informed consent, the endoscope was  ?  passed under direct vision. Throughout the procedure,  ?                       the patient's blood pressure, pulse, and oxygen  ?                       saturations were monitored continuously. The Endoscope  ?                        was introduced through the mouth, and advanced to the  ?                       jejunum. The upper GI endoscopy was accomplished  ?                       without difficulty. The patient tolerated the  ?                       procedure well. ?Findings: ?     A medium-sized hiatal hernia was present. ?     Evidence of a Roux-en-Y gastrojejunostomy was found. The gastrojejunal  ?     anastomosis was characterized by healthy appearing mucosa. This was  ?     traversed. ?     The examined jejunum was normal. Mucosa was biopsied with a cold forceps  ?     for histology. ?Impression:            - Medium-sized hiatal hernia. ?                       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 ?                       - Normal examined jejunum. Biopsied. ?Recommendation:        - Discharge patient to home. ?                       - Resume previous diet. ?                       - Continue present medications. ?                       - Await pathology results. ?                       - Perform a colonoscopy today. ?Procedure Code(s):     --- Professional --- ?                       [phone], Esophagogastroduodenoscopy, flexible,  ?                       transoral; with biopsy, single or multiple ?Diagnosis Code(s):     --- Professional --- ?                       R19.7, Diarrhea, unspecified ?  R10.84, Generalized abdominal pain ?                       Z98.0, Intestinal bypass and anastomosis status ?CPT copyright 2019 [employer]. All rights reserved. ?The codes documented in this report are preliminary and upon coder review may  ?be revised to meet current compliance requirements. ?Lucilla Lame MD, MD ?09/10/2021 8:46:00 AM ?This report has been signed electronically. ?Number of Addenda: 0 ?Note Initiated On: 09/10/2021 8:33 AM ?Estimated Blood Loss:  Estimated blood loss: none. ?     [facility] ?

## 2021-09-10 NOTE — H&P (Signed)
? ?Lucilla Lame, MD [facility] ?[address] ?[place], [place] [address] ?[phone] ?Fax : [phone] ? ?Primary Care Physician:  Einar Pheasant, MD ?Primary Gastroenterologist:  Dr. Allen Norris ? ?Pre-Procedure History & Physical: ?HPI:  Carol Mahoney is a 46 y.o. female is here for an endoscopy and colonoscopy. ?  ?Past Medical History:  ?Diagnosis Date  ? Anemia   ? Anxiety   ? Depression   ? GERD (gastroesophageal reflux disease)   ? H/O hiatal hernia   ? Hypertension   ? Hypertr obst cardiomyop   ? mitral regurgitation, LAE - followed at [facility]  ? Hypothyroidism   ? Migraine headache   ? PONV (postoperative nausea and vomiting)   ? ? ?Past Surgical History:  ?Procedure Laterality Date  ? BUNIONECTOMY    ? CHOLECYSTECTOMY    ? HIATAL HERNIA REPAIR    ? IMPLANTABLE CARDIOVERTER DEFIBRILLATOR REVISION  05/21/2014  ? [facility]  ? PARTIAL GASTRECTOMY  2010  ? TONSILLECTOMY    ? ? ?Prior to Admission medications   ?Medication Sig Start Date End Date Taking? Authorizing Provider  ?diphenoxylate-atropine (LOMOTIL) 2.5-0.025 MG tablet Take 1 tablet by mouth 4 (four) times daily as needed for diarrhea or loose stools. 07/13/21  Yes Lucilla Lame, MD  ?Mavacamten 5 MG CAPS Take by mouth.   Yes [provider]  ?Sodium Sulfate-Mag Sulfate-KCl (SUTAB) [phone] MG TABS Per instructions given 07/14/21  Yes Lucilla Lame, MD  ?Tapinarof (VTAMA) 1 % CREA Apply 1 application topically daily. Apply to affected areas face, underarms, body once a day for psoriasis. 05/27/21  Yes Brendolyn Patty, MD  ?ALPRAZolam Duanne Moron) 0.5 MG tablet 0.5 mg 3 (three) times daily as needed.  05/02/12   [provider]  ?Cholecalciferol 25 MCG (1000 UT) capsule Take 1,000 Units by mouth daily.    [provider]  ?clobetasol (TEMOVATE) 0.05 % external solution Apply 1 application. topically 2 (two) times daily. Apply 1-2 gtts to aa scalp bid until clear, then prn flares, avoid face, groin, axilla 09/07/21   Brendolyn Patty, MD   ?clobetasol cream (TEMOVATE) 0.05 % Apply to affected area on colored side of ttoo on left forearm once to twice daily for itch. Avoid Face, groin, and underarm 10/09/19   Brendolyn Patty, MD  ?cyclobenzaprine (FLEXERIL) 5 MG tablet Take 1 tablet (5 mg total) by mouth at bedtime as needed for muscle spasms. ?Patient not taking: Reported on 09/10/2021 04/01/20   Einar Pheasant, MD  ?doxycycline (ADOXA) 50 MG tablet Take 1 tablet by mouth 1 - 2 times daily with food. Do not lay down for 30 minutes after taking. Be cautious with sun exposure and use good sun protection while on this medication. ?Patient not taking: Reported on 09/10/2021 06/30/21   Brendolyn Patty, MD  ?EPINEPHrine (EPIPEN 2-PAK) 0.3 mg/0.3 mL IJ SOAJ injection USE AS DIRECTED ON PACKAGE 04/01/20   Einar Pheasant, MD  ?hydrocortisone (ANUSOL-HC) 25 MG suppository Place 1 suppository (25 mg total) rectally at bedtime. ?Patient not taking: Reported on 09/07/2021 09/03/19   Lucilla Lame, MD  ?Ivermectin (SOOLANTRA) 1 % CREA Apply to face qhs for rosacea. 05/28/21   Brendolyn Patty, MD  ?levothyroxine (SYNTHROID) 100 MCG tablet Take 1 tablet (100 mcg total) by mouth daily before breakfast. 03/05/21   Einar Pheasant, MD  ?Mavacamten (CAMZYOS) 5 MG CAPS Take by mouth. 06/30/21   [provider]  ?ondansetron (ZOFRAN) 4 MG tablet Take 1 tablet (4 mg total) by mouth 2 (two) times daily as needed for  nausea or vomiting. 07/11/18   Einar Pheasant, MD  ?pantoprazole (PROTONIX) 40 MG tablet TAKE 1 TABLET BY MOUTH  DAILY 03/30/21   Einar Pheasant, MD  ?Risankizumab-rzaa [facility] PEN) 150 MG/ML SOAJ Inject 150 mg into the skin as directed. At weeks 0 & 4. 06/29/21   Brendolyn Patty, MD  ?Risankizumab-rzaa [facility] PEN) 150 MG/ML SOAJ Inject 150 mg into the skin as directed. Every 12 weeks for maintenance. 06/29/21   Brendolyn Patty, MD  ?SUMAtriptan (IMITREX) 50 MG tablet Take 1 tablet by mouth  every 2 hours as needed for migraine(s) 03/17/15   Einar Pheasant, MD  ?tacrolimus  (PROTOPIC) 0.1 % ointment  07/25/19   [provider]  ?TRINTELLIX 20 MG TABS tablet TK 1 T PO QAM 11/13/18   [provider]  ?vitamin B-12 (CYANOCOBALAMIN) 1000 MCG tablet Take 1 tablet (1,000 mcg total) by mouth daily. 02/04/16   Einar Pheasant, MD  ? ? ?Allergies as of 07/13/2021 - Review Complete 07/13/2021  ?Allergen Reaction Noted  ? Carafate [sucralfate] Rash and Hives 04/19/2011  ? Chloraprep one step [chlorhexidine gluconate] Itching and Hives 10/14/2011  ? Shellfish-derived products Swelling 09/04/2015  ? Vancomycin Itching and Dermatitis 10/20/2011  ? Penicillins Hives and Rash 10/14/2011  ? Sulfa antibiotics Hives and Rash 10/14/2011  ? Doxycycline Other (See Comments) 06/30/2021  ? Soap  02/01/2011  ? Trimethoprim  02/01/2011  ? Iodine Rash 10/20/2011  ? ? ?Family History  ?Problem Relation Age of Onset  ? Anesthesia problems Father   ? Hypertension Mother   ? Hashimoto's thyroiditis Maternal Grandmother   ? Hypertension Maternal Grandmother   ? Melanoma Maternal Grandmother   ? Breast cancer Neg Hx   ? Colon cancer Neg Hx   ? ? ?Social History  ? ?Socioeconomic History  ? Marital status: Single  ?  Spouse name: Not on file  ? Number of children: 1  ? Years of education: Not on file  ? Highest education level: Not on file  ?Occupational History  ? Occupation: PROGRAM ANALYST  ?  Employer: [employer]  ?Tobacco Use  ? Smoking status: Never  ? Smokeless tobacco: Never  ?Vaping Use  ? Vaping Use: Never used  ?Substance and Sexual Activity  ? Alcohol use: No  ?  Alcohol/week: 0.0 standard drinks  ? Drug use: No  ? Sexual activity: Not Currently  ?Other Topics Concern  ? Not on file  ?Social History Narrative  ? Not on file  ? ?Social Determinants of Health  ? ?Financial Resource Strain: Not on file  ?Food Insecurity: Not on file  ?Transportation Needs: Not on file  ?Physical Activity: Not on file  ?Stress: Not on file  ?Social Connections: Not on file  ?Intimate Partner Violence: Not on file   ? ? ?Review of Systems: ?See HPI, otherwise negative ROS ? ?Physical Exam: ?BP 118/76   Pulse (!) 58   Temp (!) 96.8 ?F (36 ?C) (Temporal)   Resp 18   Ht '5\' 7"'$  (1.702 m)   Wt 62.6 kg   LMP 10/08/2011   SpO2 100%   BMI 21.61 kg/m?  ?General:   Alert,  pleasant and cooperative in NAD ?Head:  Normocephalic and atraumatic. ?Neck:  Supple; no masses or thyromegaly. ?Lungs:  Clear throughout to auscultation.    ?Heart:  Regular rate and rhythm. ?Abdomen:  Soft, nontender and nondistended. Normal bowel sounds, without guarding, and without rebound.   ?Neurologic:  Alert and  oriented x4;  grossly normal neurologically. ? ?Impression/Plan: ?  [name] is here for an endoscopy and colonoscopy to be performed for diarrhea and thickened jejunum  ? ?Risks, benefits, limitations, and alternatives regarding  endoscopy and colonoscopy have been reviewed with the patient.  Questions have been answered.  All parties agreeable. ? ? ?Lucilla Lame, MD  09/10/2021, 7:59 AM ?

## 2021-09-10 NOTE — Op Note (Signed)
[facility] ?Gastroenterology ?Patient Name: Carol Mahoney ?Procedure Date: 09/10/2021 8:31 AM ?MRN: [id number] ?Account #: [id number] ?Date of Birth: [birth year] ?Admit Type: Outpatient ?Age: 46 ?Room: [facility] ENDO ROOM 3 ?Gender: Female ?Note Status: Finalized ?Instrument Name: Colonscope [id number] ?Procedure:             Colonoscopy ?Indications:           Chronic diarrhea ?Providers:             Lucilla Lame MD, MD ?Referring MD:          Einar Pheasant, MD (Referring MD) ?Medicines:             Propofol per Anesthesia ?Complications:         No immediate complications. ?Procedure:             Pre-Anesthesia Assessment: ?                       - Prior to the procedure, a History and Physical was  ?                       performed, and patient medications and allergies were  ?                       reviewed. The patient's tolerance of previous  ?                       anesthesia was also reviewed. The risks and benefits  ?                       of the procedure and the sedation options and risks  ?                       were discussed with the patient. All questions were  ?                       answered, and informed consent was obtained. Prior  ?                       Anticoagulants: The patient has taken no previous  ?                       anticoagulant or antiplatelet agents. ASA Grade  ?                       Assessment: II - A patient with mild systemic disease.  ?                       After reviewing the risks and benefits, the patient  ?                       was deemed in satisfactory condition to undergo the  ?                       procedure. ?                       After obtaining informed consent, the colonoscope was  ?  passed under direct vision. Throughout the procedure,  ?                       the patient's blood pressure, pulse, and oxygen  ?                       saturations were monitored continuously. The  ?                       Colonoscope was introduced  through the anus and  ?                       advanced to the the terminal ileum. The colonoscopy  ?                       was performed without difficulty. The patient  ?                       tolerated the procedure well. The quality of the bowel  ?                       preparation was excellent. The colonoscopy was  ?                       performed without difficulty. The patient tolerated  ?                       the procedure well. The quality of the bowel  ?                       preparation was excellent. ?Findings: ?     The perianal and digital rectal examinations were normal. ?     The terminal ileum appeared normal. Biopsies were taken with a cold  ?     forceps for histology. ?     Multiple small-mouthed diverticula were found in the sigmoid colon. ?     Non-bleeding internal hemorrhoids were found during retroflexion. The  ?     hemorrhoids were Grade I (internal hemorrhoids that do not prolapse). ?     Random biopsies were obtained with cold forceps for histology randomly. ?Impression:            - The examined portion of the ileum was normal.  ?                       Biopsied. ?                       - Diverticulosis in the sigmoid colon. ?                       - Non-bleeding internal hemorrhoids. ?                       - Random biopsies were obtained. ?Recommendation:        - Discharge patient to home. ?                       - Resume previous diet. ?                       -  Continue present medications. ?                       - Await pathology results. ?Procedure Code(s):     --- Professional --- ?                       [phone], Colonoscopy, flexible; with biopsy, single or  ?                       multiple ?Diagnosis Code(s):     --- Professional --- ?                       K52.9, Noninfective gastroenteritis and colitis,  ?                       unspecified ?CPT copyright 2019 American Medical Association. All rights reserved. ?The codes documented in this report are preliminary and upon coder  review may  ?be revised to meet current compliance requirements. ?Lucilla Lame MD, MD ?09/10/2021 9:02:45 AM ?This report has been signed electronically. ?Number of Addenda: 0 ?Note Initiated On: 09/10/2021 8:31 AM ?Scope Withdrawal Time: 0 hours 9 minutes 36 seconds  ?Total Procedure Duration: 0 hours 12 minutes 58 seconds  ?Estimated Blood Loss:  Estimated blood loss: none. ?     [facility] ?

## 2021-09-11 ENCOUNTER — Encounter: Payer: Self-pay | Admitting: Gastroenterology

## 2021-09-11 LAB — SURGICAL PATHOLOGY

## 2021-09-14 ENCOUNTER — Other Ambulatory Visit: Payer: Self-pay | Admitting: Internal Medicine

## 2021-09-17 ENCOUNTER — Encounter: Payer: Self-pay | Admitting: Internal Medicine

## 2021-09-17 ENCOUNTER — Ambulatory Visit: Payer: Managed Care, Other (non HMO) | Admitting: Internal Medicine

## 2021-09-17 DIAGNOSIS — K219 Gastro-esophageal reflux disease without esophagitis: Secondary | ICD-10-CM

## 2021-09-17 DIAGNOSIS — I4729 Other ventricular tachycardia: Secondary | ICD-10-CM | POA: Diagnosis not present

## 2021-09-17 DIAGNOSIS — I421 Obstructive hypertrophic cardiomyopathy: Secondary | ICD-10-CM | POA: Diagnosis not present

## 2021-09-17 DIAGNOSIS — E039 Hypothyroidism, unspecified: Secondary | ICD-10-CM

## 2021-09-17 DIAGNOSIS — F439 Reaction to severe stress, unspecified: Secondary | ICD-10-CM

## 2021-09-17 DIAGNOSIS — G43809 Other migraine, not intractable, without status migrainosus: Secondary | ICD-10-CM | POA: Diagnosis not present

## 2021-09-17 DIAGNOSIS — D508 Other iron deficiency anemias: Secondary | ICD-10-CM

## 2021-09-17 DIAGNOSIS — K580 Irritable bowel syndrome with diarrhea: Secondary | ICD-10-CM

## 2021-09-17 DIAGNOSIS — D696 Thrombocytopenia, unspecified: Secondary | ICD-10-CM

## 2021-09-17 NOTE — Progress Notes (Signed)
Patient ID: Carol Mahoney, female   DOB: 1976/04/24, 46 y.o.   MRN: [id number] ? ? ?Subjective:  ? ? Patient ID: Carol Mahoney, female    DOB: 03-Nov-1975, 46 y.o.   MRN: [id number] ? ?This visit occurred during the SARS-CoV-2 public health emergency.  Safety protocols were in place, including screening questions prior to the visit, additional usage of staff PPE, and extensive cleaning of exam room while observing appropriate contact time as indicated for disinfecting solutions.  ? ?Patient here for scheduled follow up.  ? ?Chief Complaint  ?Patient presents with  ? Follow-up  ?  3 mo f/u   ? .  ? ?HPI ?Here to follow up regarding her increased stress, GI issues and HOCM.  Discussed increased stress.  Seeing psychiatry.  No chest pain.  Breathing overall stable.  Being followed by Dr Mina Marble ([place]) - with recent ECHO - severe LVH, mild MR.  Compared with prior echo study 08/19/21 - LVOT gradients and velocities slightly reduced with valsalva maneuver.  No increased lower extremity swelling.  Eating.  Persistent GI issues.  Followed by Dr Allen Norris.   ? ? ?Past Medical History:  ?Diagnosis Date  ? Anemia   ? Anxiety   ? Depression   ? GERD (gastroesophageal reflux disease)   ? H/O hiatal hernia   ? Hypertension   ? Hypertr obst cardiomyop   ? mitral regurgitation, LAE - followed at [facility]  ? Hypothyroidism   ? Migraine headache   ? PONV (postoperative nausea and vomiting)   ? ?Past Surgical History:  ?Procedure Laterality Date  ? BUNIONECTOMY    ? CHOLECYSTECTOMY    ? COLONOSCOPY WITH PROPOFOL N/A 09/10/2021  ? Procedure: COLONOSCOPY WITH PROPOFOL;  Surgeon: Lucilla Lame, MD;  Location: [facility] ENDOSCOPY;  Service: Endoscopy;  Laterality: N/A;  ? ESOPHAGOGASTRODUODENOSCOPY N/A 09/10/2021  ? Procedure: ESOPHAGOGASTRODUODENOSCOPY (EGD);  Surgeon: Lucilla Lame, MD;  Location: [facility] ENDOSCOPY;  Service: Endoscopy;  Laterality: N/A;  ? HIATAL HERNIA REPAIR    ? IMPLANTABLE CARDIOVERTER DEFIBRILLATOR REVISION  05/21/2014  ? [facility]  ? PARTIAL  GASTRECTOMY  2010  ? TONSILLECTOMY    ? ?Family History  ?Problem Relation Age of Onset  ? Anesthesia problems Father   ? Hypertension Mother   ? Hashimoto's thyroiditis Maternal Grandmother   ? Hypertension Maternal Grandmother   ? Melanoma Maternal Grandmother   ? Breast cancer Neg Hx   ? Colon cancer Neg Hx   ? ?Social History  ? ?Socioeconomic History  ? Marital status: Single  ?  Spouse name: Not on file  ? Number of children: 1  ? Years of education: Not on file  ? Highest education level: Not on file  ?Occupational History  ? Occupation: PROGRAM ANALYST  ?  Employer: [employer]  ?Tobacco Use  ? Smoking status: Never  ? Smokeless tobacco: Never  ?Vaping Use  ? Vaping Use: Never used  ?Substance and Sexual Activity  ? Alcohol use: No  ?  Alcohol/week: 0.0 standard drinks  ? Drug use: No  ? Sexual activity: Not Currently  ?Other Topics Concern  ? Not on file  ?Social History Narrative  ? Not on file  ? ?Social Determinants of Health  ? ?Financial Resource Strain: Not on file  ?Food Insecurity: Not on file  ?Transportation Needs: Not on file  ?Physical Activity: Not on file  ?Stress: Not on file  ?Social Connections: Not on file  ? ? ? ?Review of Systems  ?Constitutional:  Negative for appetite  change and unexpected weight change.  ?HENT:  Negative for congestion and sinus pressure.   ?Respiratory:  Negative for cough and chest tightness.   ?     Breathing stable.   ?Cardiovascular:  Negative for chest pain and palpitations.  ?     No increased swelling.    ?Gastrointestinal:  Positive for diarrhea. Negative for nausea and vomiting.  ?Genitourinary:  Negative for difficulty urinating and dysuria.  ?Musculoskeletal:  Negative for joint swelling and myalgias.  ?Skin:  Negative for color change and rash.  ?Neurological:  Negative for dizziness, light-headedness and headaches.  ?Psychiatric/Behavioral:  Negative for agitation and dysphoric mood.   ? ?   ?Objective:  ?  ? ?BP 104/70 (BP Location: Left Arm, Patient  Position: Sitting, Cuff Size: Small)   Pulse (!) 59   Temp 98.2 ?F (36.8 ?C) (Temporal)   Resp 17   Ht '5\' 7"'$  (1.702 m)   Wt 147 lb (66.7 kg)   LMP 10/08/2011   SpO2 99%   BMI 23.02 kg/m?  ?Wt Readings from Last 3 Encounters:  ?09/17/21 147 lb (66.7 kg)  ?09/10/21 138 lb (62.6 kg)  ?07/13/21 145 lb (65.8 kg)  ? ? ?Physical Exam ?Vitals reviewed.  ?Constitutional:   ?   General: She is not in acute distress. ?   Appearance: Normal appearance.  ?HENT:  ?   Head: Normocephalic and atraumatic.  ?   Right Ear: External ear normal.  ?   Left Ear: External ear normal.  ?Eyes:  ?   General: No scleral icterus.    ?   Right eye: No discharge.     ?   Left eye: No discharge.  ?   Conjunctiva/sclera: Conjunctivae normal.  ?Neck:  ?   Thyroid: No thyromegaly.  ?Cardiovascular:  ?   Rate and Rhythm: Normal rate and regular rhythm.  ?   Comments: 5-1/7 systolic murmur.   ?Pulmonary:  ?   Effort: No respiratory distress.  ?   Breath sounds: Normal breath sounds. No wheezing.  ?Abdominal:  ?   General: Bowel sounds are normal.  ?   Palpations: Abdomen is soft.  ?   Tenderness: There is no abdominal tenderness.  ?Musculoskeletal:     ?   General: No swelling or tenderness.  ?   Cervical back: Neck supple. No tenderness.  ?Lymphadenopathy:  ?   Cervical: No cervical adenopathy.  ?Skin: ?   Findings: No erythema or rash.  ?Neurological:  ?   Mental Status: She is alert.  ?Psychiatric:     ?   Mood and Affect: Mood normal.     ?   Behavior: Behavior normal.  ? ? ? ?Outpatient Encounter Medications as of 09/17/2021  ?Medication Sig  ? ALPRAZolam (XANAX) 0.5 MG tablet 0.5 mg 3 (three) times daily as needed.   ? Cholecalciferol 25 MCG (1000 UT) capsule Take 1,000 Units by mouth daily.  ? clobetasol (TEMOVATE) 0.05 % external solution Apply 1 application. topically 2 (two) times daily. Apply 1-2 gtts to aa scalp bid until clear, then prn flares, avoid face, groin, axilla  ? clobetasol cream (TEMOVATE) 0.05 % Apply to affected area on  colored side of ttoo on left forearm once to twice daily for itch. Avoid Face, groin, and underarm  ? diphenoxylate-atropine (LOMOTIL) 2.5-0.025 MG tablet Take 1 tablet by mouth 4 (four) times daily as needed for diarrhea or loose stools.  ? EPINEPHrine (EPIPEN 2-PAK) 0.3 mg/0.3 mL IJ SOAJ injection USE AS DIRECTED  ON PACKAGE  ? Ivermectin (SOOLANTRA) 1 % CREA Apply to face qhs for rosacea.  ? levothyroxine (SYNTHROID) 100 MCG tablet TAKE 1 TABLET(100 MCG) BY MOUTH DAILY BEFORE BREAKFAST  ? Mavacamten (CAMZYOS) 5 MG CAPS Take by mouth.  ? Mavacamten 5 MG CAPS Take by mouth.  ? ondansetron (ZOFRAN) 4 MG tablet Take 1 tablet (4 mg total) by mouth 2 (two) times daily as needed for nausea or vomiting.  ? pantoprazole (PROTONIX) 40 MG tablet TAKE 1 TABLET BY MOUTH  DAILY  ? Risankizumab-rzaa (SKYRIZI PEN) 150 MG/ML SOAJ Inject 150 mg into the skin as directed. At weeks 0 & 4.  ? Risankizumab-rzaa (SKYRIZI PEN) 150 MG/ML SOAJ Inject 150 mg into the skin as directed. Every 12 weeks for maintenance.  ? Sodium Sulfate-Mag Sulfate-KCl (SUTAB) [phone] MG TABS Per instructions given  ? SUMAtriptan (IMITREX) 50 MG tablet Take 1 tablet by mouth  every 2 hours as needed for migraine(s)  ? Tapinarof (VTAMA) 1 % CREA Apply 1 application topically daily. Apply to affected areas face, underarms, body once a day for psoriasis.  ? TRINTELLIX 20 MG TABS tablet TK 1 T PO QAM  ? vitamin B-12 (CYANOCOBALAMIN) 1000 MCG tablet Take 1 tablet (1,000 mcg total) by mouth daily.  ? [DISCONTINUED] cyclobenzaprine (FLEXERIL) 5 MG tablet Take 1 tablet (5 mg total) by mouth at bedtime as needed for muscle spasms. (Patient not taking: Reported on 09/10/2021)  ? [DISCONTINUED] doxycycline (ADOXA) 50 MG tablet Take 1 tablet by mouth 1 - 2 times daily with food. Do not lay down for 30 minutes after taking. Be cautious with sun exposure and use good sun protection while on this medication. (Patient not taking: Reported on 09/10/2021)  ? [DISCONTINUED]  hydrocortisone (ANUSOL-HC) 25 MG suppository Place 1 suppository (25 mg total) rectally at bedtime. (Patient not taking: Reported on 09/07/2021)  ? [DISCONTINUED] tacrolimus (PROTOPIC) 0.1 % ointment  (Patient not taki

## 2021-09-21 ENCOUNTER — Encounter: Payer: Self-pay | Admitting: Internal Medicine

## 2021-09-21 DIAGNOSIS — F439 Reaction to severe stress, unspecified: Secondary | ICD-10-CM | POA: Insufficient documentation

## 2021-09-21 NOTE — Assessment & Plan Note (Signed)
Follow cbc.  

## 2021-09-21 NOTE — Assessment & Plan Note (Signed)
On thyroid replacement.  Follow tsh.  

## 2021-09-21 NOTE — Assessment & Plan Note (Signed)
S/p septal myomectomy.  S/p ICD placement.  Stable.  

## 2021-09-21 NOTE — Assessment & Plan Note (Signed)
Stable

## 2021-09-21 NOTE — Assessment & Plan Note (Signed)
On protonix.  No upper symptoms reported.   

## 2021-09-21 NOTE — Assessment & Plan Note (Signed)
Followed by Dr Shah.  Stable.   

## 2021-09-21 NOTE — Assessment & Plan Note (Signed)
Increased stress.  Discussed.  Seeing psychiatry. Will notify if feels needs any further intervention.  Follow.  

## 2021-09-21 NOTE — Assessment & Plan Note (Signed)
Has been evaluated by hematology.  Previously received iron infusions.  Follow cbc and iron studies.   

## 2021-09-21 NOTE — Assessment & Plan Note (Signed)
Followed by Dr Wang.  

## 2021-11-04 ENCOUNTER — Telehealth: Payer: Self-pay

## 2021-11-04 DIAGNOSIS — R197 Diarrhea, unspecified: Secondary | ICD-10-CM

## 2021-11-04 NOTE — Telephone Encounter (Signed)
Pt lmovm as she received unable to contact letter

## 2021-11-13 ENCOUNTER — Encounter: Payer: Self-pay | Admitting: Gastroenterology

## 2021-11-23 ENCOUNTER — Encounter: Payer: Self-pay | Admitting: Dermatology

## 2021-11-23 DIAGNOSIS — L299 Pruritus, unspecified: Secondary | ICD-10-CM

## 2021-11-25 MED ORDER — CLOBETASOL PROPIONATE 0.05 % EX CREA: TOPICAL_CREAM | CUTANEOUS | 0 refills | Status: AC

## 2021-12-17 ENCOUNTER — Ambulatory Visit: Payer: Managed Care, Other (non HMO) | Admitting: Internal Medicine

## 2021-12-17 ENCOUNTER — Encounter: Payer: Self-pay | Admitting: Internal Medicine

## 2021-12-17 DIAGNOSIS — L900 Lichen sclerosus et atrophicus: Secondary | ICD-10-CM

## 2021-12-17 DIAGNOSIS — K219 Gastro-esophageal reflux disease without esophagitis: Secondary | ICD-10-CM

## 2021-12-17 DIAGNOSIS — F439 Reaction to severe stress, unspecified: Secondary | ICD-10-CM

## 2021-12-17 DIAGNOSIS — I421 Obstructive hypertrophic cardiomyopathy: Secondary | ICD-10-CM

## 2021-12-17 DIAGNOSIS — I4729 Other ventricular tachycardia: Secondary | ICD-10-CM | POA: Diagnosis not present

## 2021-12-17 DIAGNOSIS — G43119 Migraine with aura, intractable, without status migrainosus: Secondary | ICD-10-CM | POA: Diagnosis not present

## 2021-12-17 DIAGNOSIS — E538 Deficiency of other specified B group vitamins: Secondary | ICD-10-CM

## 2021-12-17 DIAGNOSIS — K580 Irritable bowel syndrome with diarrhea: Secondary | ICD-10-CM

## 2021-12-17 DIAGNOSIS — D508 Other iron deficiency anemias: Secondary | ICD-10-CM

## 2021-12-17 DIAGNOSIS — D696 Thrombocytopenia, unspecified: Secondary | ICD-10-CM

## 2021-12-17 DIAGNOSIS — E039 Hypothyroidism, unspecified: Secondary | ICD-10-CM

## 2021-12-17 NOTE — Progress Notes (Signed)
Patient ID: Carol Mahoney, female   DOB: 1975-11-04, 46 y.o.   MRN: [id number]   Subjective:    Patient ID: Carol Mahoney, female    DOB: 12-Feb-1976, 46 y.o.   MRN: [id number]   Patient here for a scheduled follow up.  Marland Kitchen   HPI Persistent increased stress.  Discussed.  Seeing Dr Toy Care.  Will notify me if feels needs further intervention.  Swelling better.  Not requiring diuretic.  No chest pain.  Breathing stable.  No acid reflux reported.  Eating.  Bowels stable.  Saw neurology 12/10/21 - f/u migraine with aura.  Has imitrex to take prn.  Has noticed more auras recently without progression to headache.     Past Medical History:  Diagnosis Date   Anemia    Anxiety    Depression    GERD (gastroesophageal reflux disease)    H/O hiatal hernia    Hypertension    Hypertr obst cardiomyop    mitral regurgitation, LAE - followed at [facility]   Hypothyroidism    Migraine headache    PONV (postoperative nausea and vomiting)    Past Surgical History:  Procedure Laterality Date   BUNIONECTOMY     CHOLECYSTECTOMY     COLONOSCOPY WITH PROPOFOL N/A 09/10/2021   Procedure: COLONOSCOPY WITH PROPOFOL;  Surgeon: Lucilla Lame, MD;  Location: [facility] ENDOSCOPY;  Service: Endoscopy;  Laterality: N/A;   ESOPHAGOGASTRODUODENOSCOPY N/A 09/10/2021   Procedure: ESOPHAGOGASTRODUODENOSCOPY (EGD);  Surgeon: Lucilla Lame, MD;  Location: [facility] ENDOSCOPY;  Service: Endoscopy;  Laterality: N/A;   HIATAL HERNIA REPAIR     IMPLANTABLE CARDIOVERTER DEFIBRILLATOR REVISION  05/21/2014   [facility]   PARTIAL GASTRECTOMY  2010   TONSILLECTOMY     Family History  Problem Relation Age of Onset   Anesthesia problems Father    Hypertension Mother    Hashimoto's thyroiditis Maternal Grandmother    Hypertension Maternal Grandmother    Melanoma Maternal Grandmother    Breast cancer Neg Hx    Colon cancer Neg Hx    Social History   Socioeconomic History   Marital status: Single    Spouse name: Not on file   Number of children: 1    Years of education: Not on file   Highest education level: Not on file  Occupational History   Occupation: PROGRAM ANALYST    Employer: [employer]  Tobacco Use   Smoking status: Never   Smokeless tobacco: Never  Vaping Use   Vaping Use: Never used  Substance and Sexual Activity   Alcohol use: No    Alcohol/week: 0.0 standard drinks of alcohol   Drug use: No   Sexual activity: Not Currently  Other Topics Concern   Not on file  Social History Narrative   Not on file   Social Determinants of Health   Financial Resource Strain: Not on file  Food Insecurity: Not on file  Transportation Needs: Not on file  Physical Activity: Not on file  Stress: Not on file  Social Connections: Not on file     Review of Systems  Constitutional:  Negative for appetite change and unexpected weight change.  HENT:  Negative for congestion and sinus pressure.   Respiratory:  Negative for cough and chest tightness.        Breathing stable.   Cardiovascular:  Negative for chest pain and palpitations.       No increased swelling.   Gastrointestinal:  Negative for nausea and vomiting.       Bowel issues stable.  Genitourinary:  Negative for difficulty urinating and dysuria.  Musculoskeletal:  Negative for joint swelling and myalgias.  Skin:  Negative for color change and rash.  Neurological:  Negative for dizziness.       Migraines as outlined.   Psychiatric/Behavioral:  Negative for agitation and dysphoric mood.        Objective:     BP 100/66 (BP Location: Left Arm, Patient Position: Sitting, Cuff Size: Small)   Pulse 65   Temp 98.4 F (36.9 C) (Temporal)   Resp 16   Ht '5\' 7"'$  (1.702 m)   Wt 140 lb (63.5 kg)   LMP 10/08/2011   SpO2 96%   BMI 21.93 kg/m  Wt Readings from Last 3 Encounters:  12/17/21 140 lb (63.5 kg)  09/17/21 147 lb (66.7 kg)  09/10/21 138 lb (62.6 kg)    Physical Exam Vitals reviewed.  Constitutional:      General: She is not in acute distress.     Appearance: Normal appearance.  HENT:     Head: Normocephalic and atraumatic.     Right Ear: External ear normal.     Left Ear: External ear normal.  Eyes:     General: No scleral icterus.       Right eye: No discharge.        Left eye: No discharge.     Conjunctiva/sclera: Conjunctivae normal.  Neck:     Thyroid: No thyromegaly.  Cardiovascular:     Rate and Rhythm: Normal rate and regular rhythm.  Pulmonary:     Effort: No respiratory distress.     Breath sounds: Normal breath sounds. No wheezing.  Abdominal:     General: Bowel sounds are normal.     Palpations: Abdomen is soft.     Tenderness: There is no abdominal tenderness.  Musculoskeletal:        General: No swelling or tenderness.     Cervical back: Neck supple. No tenderness.  Lymphadenopathy:     Cervical: No cervical adenopathy.  Skin:    Findings: No erythema or rash.  Neurological:     Mental Status: She is alert.  Psychiatric:        Mood and Affect: Mood normal.        Behavior: Behavior normal.      Outpatient Encounter Medications as of 12/17/2021  Medication Sig   ALPRAZolam (XANAX) 0.5 MG tablet 0.5 mg 3 (three) times daily as needed.    Cholecalciferol 25 MCG (1000 UT) capsule Take 1,000 Units by mouth daily.   clobetasol (TEMOVATE) 0.05 % external solution Apply 1 application. topically 2 (two) times daily. Apply 1-2 gtts to aa scalp bid until clear, then prn flares, avoid face, groin, axilla   clobetasol cream (TEMOVATE) 0.05 % Apply to itchy rash QD-BID PRN. Avoid applying to face, groin, and axilla. Use as directed. Long-term use can cause thinning of the skin.   diphenoxylate-atropine (LOMOTIL) 2.5-0.025 MG tablet Take 1 tablet by mouth 4 (four) times daily as needed for diarrhea or loose stools.   EPINEPHrine (EPIPEN 2-PAK) 0.3 mg/0.3 mL IJ SOAJ injection USE AS DIRECTED ON PACKAGE   Ivermectin (SOOLANTRA) 1 % CREA Apply to face qhs for rosacea.   levothyroxine (SYNTHROID) 100 MCG tablet TAKE 1  TABLET(100 MCG) BY MOUTH DAILY BEFORE BREAKFAST   Mavacamten (CAMZYOS) 5 MG CAPS Take by mouth.   Mavacamten 5 MG CAPS Take by mouth.   ondansetron (ZOFRAN) 4 MG tablet Take 1 tablet (4 mg total) by mouth 2 (  two) times daily as needed for nausea or vomiting.   pantoprazole (PROTONIX) 40 MG tablet TAKE 1 TABLET BY MOUTH  DAILY   Risankizumab-rzaa (SKYRIZI PEN) 150 MG/ML SOAJ Inject 150 mg into the skin as directed. At weeks 0 & 4.   Risankizumab-rzaa (SKYRIZI PEN) 150 MG/ML SOAJ Inject 150 mg into the skin as directed. Every 12 weeks for maintenance.   Sodium Sulfate-Mag Sulfate-KCl (SUTAB) [phone] MG TABS Per instructions given   SUMAtriptan (IMITREX) 50 MG tablet Take 1 tablet by mouth  every 2 hours as needed for migraine(s)   Tapinarof (VTAMA) 1 % CREA Apply 1 application topically daily. Apply to affected areas face, underarms, body once a day for psoriasis.   TRINTELLIX 20 MG TABS tablet TK 1 T PO QAM   vitamin B-12 (CYANOCOBALAMIN) 1000 MCG tablet Take 1 tablet (1,000 mcg total) by mouth daily.   No facility-administered encounter medications on file as of 12/17/2021.     Lab Results  Component Value Date   WBC 3.7 06/24/2021   HGB 13.2 06/24/2021   HCT 38.0 06/24/2021   PLT 160 06/24/2021   GLUCOSE 62 (L) 06/24/2021   CHOL 184 06/24/2021   TRIG 55 06/24/2021   HDL 75 06/24/2021   LDLCALC 98 06/24/2021   ALT 32 06/24/2021   AST 36 06/24/2021   NA 143 06/24/2021   K 3.9 06/24/2021   CL 105 06/24/2021   CREATININE 0.89 06/24/2021   BUN 12 06/24/2021   CO2 24 06/24/2021   TSH 4.410 06/24/2021   INR 1.0 09/26/2014   HGBA1C 4.9 12/12/2014       Assessment & Plan:   Problem List Items Addressed This Visit     B12 deficiency    Check B12 level with next labs.       Relevant Orders   Vitamin B12   GERD (gastroesophageal reflux disease)    On protonix.  No upper symptoms reported.        Hypertrophic obstructive cardiomyopathy (HCC)    S/p septal myomectomy.   S/p ICD placement.  Stable.       Relevant Orders   Comprehensive metabolic panel   Hypothyroidism    On thyroid replacement.  Follow tsh.       Relevant Orders   TSH   IBS    Stable.  No change.       Intractable migraine with aura without status migrainosus    Seeing neurology.  Has imitrex to take prn.  Planning to see ophthalmology.       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  Previously received iron infusions.  Follow cbc and iron studies.        Lichen sclerosus    Has seen gyn.  Has used clobetasol.  Needs continued f/u with gyn.       NSVT (nonsustained ventricular tachycardia) ([place])    Followed by Dr Mina Marble.       Stress    Increased stress.  Discussed.  Seeing psychiatry. Will notify if feels needs any further intervention.  Follow.       Thrombocytopenia ([place])    Follow cbc.         Einar Pheasant, MD

## 2021-12-20 NOTE — Assessment & Plan Note (Signed)
Stable  No change

## 2021-12-20 NOTE — Assessment & Plan Note (Signed)
Seeing neurology.  Has imitrex to take prn.  Planning to see ophthalmology.

## 2021-12-20 NOTE — Assessment & Plan Note (Signed)
Follow cbc.  

## 2021-12-20 NOTE — Assessment & Plan Note (Signed)
Has been evaluated by hematology.  Previously received iron infusions.  Follow cbc and iron studies.   

## 2021-12-20 NOTE — Assessment & Plan Note (Signed)
On thyroid replacement.  Follow tsh.  

## 2021-12-20 NOTE — Assessment & Plan Note (Signed)
Increased stress.  Discussed.  Seeing psychiatry. Will notify if feels needs any further intervention.  Follow.

## 2021-12-20 NOTE — Assessment & Plan Note (Signed)
S/p septal myomectomy.  S/p ICD placement.  Stable.

## 2021-12-20 NOTE — Assessment & Plan Note (Signed)
Followed by Dr Wang.  

## 2021-12-20 NOTE — Assessment & Plan Note (Signed)
Check B12 level with next labs.  

## 2021-12-20 NOTE — Assessment & Plan Note (Signed)
On protonix.  No upper symptoms reported.   

## 2021-12-20 NOTE — Assessment & Plan Note (Signed)
Has seen gyn.  Has used clobetasol.  Needs continued f/u with gyn.  

## 2021-12-21 ENCOUNTER — Encounter: Payer: Self-pay | Admitting: Gastroenterology

## 2022-01-11 ENCOUNTER — Ambulatory Visit (INDEPENDENT_AMBULATORY_CARE_PROVIDER_SITE_OTHER): Payer: Managed Care, Other (non HMO) | Admitting: Dermatology

## 2022-01-11 DIAGNOSIS — L719 Rosacea, unspecified: Secondary | ICD-10-CM | POA: Diagnosis not present

## 2022-01-11 DIAGNOSIS — Z79899 Other long term (current) drug therapy: Secondary | ICD-10-CM | POA: Diagnosis not present

## 2022-01-11 DIAGNOSIS — L299 Pruritus, unspecified: Secondary | ICD-10-CM | POA: Diagnosis not present

## 2022-01-11 DIAGNOSIS — L409 Psoriasis, unspecified: Secondary | ICD-10-CM | POA: Diagnosis not present

## 2022-01-11 MED ORDER — CLOBETASOL PROPIONATE 0.05 % EX SOLN: 1.0000 | Freq: Two times a day (BID) | CUTANEOUS | 1 refills | Status: DC

## 2022-01-11 MED ORDER — CICLOPIROX 1 % EX SHAM: MEDICATED_SHAMPOO | CUTANEOUS | 5 refills | Status: DC

## 2022-01-11 NOTE — Progress Notes (Signed)
Follow-Up Visit   Subjective  Carol Mahoney is a 46 y.o. female who presents for the following: Follow-up.  Patient presents for 4 month follow-up psoriasis. She is on Skyrizi injections (started 08/10/21) and topicals clobetasol solution for the scalpl and Vtama cream for the body. She has noticed a few more breakouts on the body, but overall improved with injections. No infections, no injection site reactions.   Rosacea is currently controlled using Soolantra Cream nightly. She is not currently taking doxycycline.  The following portions of the chart were reviewed this encounter and updated as appropriate:       Review of Systems:  No other skin or systemic complaints except as noted in HPI or Assessment and Plan.  Objective  Well appearing patient in no apparent distress; mood and affect are within normal limits.  A focused examination was performed including scalp, face. Relevant physical exam findings are noted in the Assessment and Plan.  Scalp Mild diffuse scaling of the occipital scalp, parietal scalp  face Mild erythema on the nose, eyelids.    Assessment & Plan  Psoriasis Scalp  Chronic and persistent condition with duration or expected duration over one year. Condition is symptomatic / bothersome to patient. Not to goal, but improving.   Psoriasis - severe on systemic "biologic" treatment injections.  Psoriasis is a chronic non-curable, but treatable genetic/hereditary disease that may have other systemic features affecting other organ systems such as joints (Psoriatic Arthritis).  It is linked with heart disease, inflammatory bowel disease, non-alcoholic fatty liver disease, and depression. Significant skin psoriasis and/or psoriatic arthritis may have significant symptoms and affects activities of daily activity and often benefits from systemic "biologic" injection treatments.  These "biologic" treatments have some potential side effects including immunosuppression  and require pre-treatment laboratory screening and periodic laboratory monitoring and periodic in person evaluation and monitoring by the attending dermatologist physician (long term medication management).  Start Ciclopirox shampoo Massage into scalp and let sit several minutes before rinsing. Use QD until improved, then 2-3x/wk for maintenance. Dsp 171m 5Rf. Continue Skyrizi sq injections q 12 wks. Continue clobetasol solution Apply a few drops to AA scalp QD/BID prn. Avoid face, groin, axilla.   Reviewed risks of biologics including immunosuppression, infections, injection site reaction, and failure to improve condition. Goal is control of skin condition, not cure.  Some older biologics such as Humira and Enbrel may slightly increase risk of malignancy and may worsen congestive heart failure.  Talz and Cosentyx may cause inflammatory bowel disease to flare. The use of biologics requires long term medication management, including periodic office visits and monitoring of blood work.    Ciclopirox 1 % shampoo - Scalp Massage into scalp and let sit several minutes before rinsing. Use QD until improved, then 2-3x/wk for maintenance.  Related Medications Tapinarof (VTAMA) 1 % CREA Apply 1 application topically daily. Apply to affected areas face, underarms, body once a day for psoriasis.  clobetasol (TEMOVATE) 0.05 % external solution Apply 1 Application topically 2 (two) times daily. Apply 1-2 gtts to aa scalp bid until clear, then prn flares, avoid face, groin, axilla  Pruritus  Related Medications clobetasol cream (TEMOVATE) 0.05 % Apply to itchy rash QD-BID PRN. Avoid applying to face, groin, and axilla. Use as directed. Long-term use can cause thinning of the skin.  Rosacea face  Chronic condition with duration or expected duration over one year. Currently well-controlled.  Rosacea is a chronic progressive skin condition usually affecting the face of adults, causing redness  and/or  acne bumps. It is treatable but not curable. It sometimes affects the eyes (ocular rosacea) as well. It may respond to topical and/or systemic medication and can flare with stress, sun exposure, alcohol, exercise and some foods.  Daily application of broad spectrum spf 30+ sunscreen to face is recommended to reduce flares.  Continue Soolantra Cream QHS face.  Patient not currently using doxycycline. May restart prn flares.   Doxycycline should be taken with food to prevent nausea. Do not lay down for 30 minutes after taking. Be cautious with sun exposure and use good sun protection while on this medication. Pregnant women should not take this medication.     Related Medications Ivermectin (SOOLANTRA) 1 % CREA Apply to face qhs for rosacea.   Return in about 6 months (around 07/14/2022) for Psoriasis.  IJamesetta Orleans, CMA, am acting as scribe for Brendolyn Patty, MD .  Documentation: I have reviewed the above documentation for accuracy and completeness, and I agree with the above.  Brendolyn Patty MD

## 2022-01-11 NOTE — Patient Instructions (Signed)
Reviewed risks of biologics including immunosuppression, infections, injection site reaction, and failure to improve condition. Goal is control of skin condition, not cure.  Some older biologics such as Humira and Enbrel may slightly increase risk of malignancy and may worsen congestive heart failure.  Talz and Cosentyx may cause inflammatory bowel disease to flare. The use of biologics requires long term medication management, including periodic office visits and monitoring of blood work.  Topical steroids (such as triamcinolone, fluocinolone, fluocinonide, mometasone, clobetasol, halobetasol, betamethasone, hydrocortisone) can cause thinning and lightening of the skin if they are used for too long in the same area. Your physician has selected the right strength medicine for your problem and area affected on the body. Please use your medication only as directed by your physician to prevent side effects.    Due to recent changes in healthcare laws, you may see results of your pathology and/or laboratory studies on MyChart before the doctors have had a chance to review them. We understand that in some cases there may be results that are confusing or concerning to you. Please understand that not all results are received at the same time and often the doctors may need to interpret multiple results in order to provide you with the best plan of care or course of treatment. Therefore, we ask that you please give us 2 business days to thoroughly review all your results before contacting the office for clarification. Should we see a critical lab result, you will be contacted sooner.   If You Need Anything After Your Visit  If you have any questions or concerns for your doctor, please call our main line at [phone] and press option 4 to reach your doctor's medical assistant. If no one answers, please leave a voicemail as directed and we will return your call as soon as possible. Messages left after 4 pm will be  answered the following business day.   You may also send us a message via MyChart. We typically respond to MyChart messages within 1-2 business days.  For prescription refills, please ask your pharmacy to contact our office. Our fax number is [phone].  If you have an urgent issue when the clinic is closed that cannot wait until the next business day, you can page your doctor at the number below.    Please note that while we do our best to be available for urgent issues outside of office hours, we are not available 24/7.   If you have an urgent issue and are unable to reach us, you may choose to seek medical care at your doctor's office, retail clinic, urgent care center, or emergency room.  If you have a medical emergency, please immediately call 911 or go to the emergency department.  Pager Numbers  - Dr. Kowalski: [phone]  - Dr. Moye: [phone]  - Dr. Stewart: [phone]  In the event of inclement weather, please call our main line at [phone] for an update on the status of any delays or closures.  Dermatology Medication Tips: Please keep the boxes that topical medications come in in order to help keep track of the instructions about where and how to use these. Pharmacies typically print the medication instructions only on the boxes and not directly on the medication tubes.   If your medication is too expensive, please contact our office at [phone] option 4 or send us a message through MyChart.   We are unable to tell what your co-pay for medications will be in advance as this   is different depending on your insurance coverage. However, we may be able to find a substitute medication at lower cost or fill out paperwork to get insurance to cover a needed medication.   If a prior authorization is required to get your medication covered by your insurance company, please allow us 1-2 business days to complete this process.  Drug prices often vary depending on  where the prescription is filled and some pharmacies may offer cheaper prices.  The website www.goodrx.com contains coupons for medications through different pharmacies. The prices here do not account for what the cost may be with help from insurance (it may be cheaper with your insurance), but the website can give you the price if you did not use any insurance.  - You can print the associated coupon and take it with your prescription to the pharmacy.  - You may also stop by our office during regular business hours and pick up a GoodRx coupon card.  - If you need your prescription sent electronically to a different pharmacy, notify our office through [place] MyChart or by phone at [phone] option 4.     Si Usted Necesita Algo Despus de Su Visita  Tambin puede enviarnos un mensaje a travs de MyChart. Por lo general respondemos a los mensajes de MyChart en el transcurso de 1 a 2 das hbiles.  Para renovar recetas, por favor pida a su farmacia que se ponga en contacto con nuestra oficina. Nuestro nmero de fax es el [phone].  Si tiene un asunto urgente cuando la clnica est cerrada y que no puede esperar hasta el siguiente da hbil, puede llamar/localizar a su doctor(a) al nmero que aparece a continuacin.   Por favor, tenga en cuenta que aunque hacemos todo lo posible para estar disponibles para asuntos urgentes fuera del horario de oficina, no estamos disponibles las 24 horas del da, los 7 das de la semana.   Si tiene un problema urgente y no puede comunicarse con nosotros, puede optar por buscar atencin mdica  en el consultorio de su doctor(a), en una clnica privada, en un centro de atencin urgente o en una sala de emergencias.  Si tiene una emergencia mdica, por favor llame inmediatamente al 911 o vaya a la sala de emergencias.  Nmeros de bper  - Dr. Kowalski: [phone]  - Dra. Moye: [phone]  - Dra. Stewart: [phone]  En caso de inclemencias  del tiempo, por favor llame a nuestra lnea principal al [phone] para una actualizacin sobre el estado de cualquier retraso o cierre.  Consejos para la medicacin en dermatologa: Por favor, guarde las cajas en las que vienen los medicamentos de uso tpico para ayudarle a seguir las instrucciones sobre dnde y cmo usarlos. Las farmacias generalmente imprimen las instrucciones del medicamento slo en las cajas y no directamente en los tubos del medicamento.   Si su medicamento es muy caro, por favor, pngase en contacto con nuestra oficina llamando al [phone] y presione la opcin 4 o envenos un mensaje a travs de MyChart.   No podemos decirle cul ser su copago por los medicamentos por adelantado ya que esto es diferente dependiendo de la cobertura de su seguro. Sin embargo, es posible que podamos encontrar un medicamento sustituto a menor costo o llenar un formulario para que el seguro cubra el medicamento que se considera necesario.   Si se requiere una autorizacin previa para que su compaa de seguros cubra su medicamento, por favor permtanos de 1 a 2 das hbiles para   completar este proceso.  Los precios de los medicamentos varan con frecuencia dependiendo del lugar de dnde se surte la receta y alguna farmacias pueden ofrecer precios ms baratos.  El sitio web www.goodrx.com tiene cupones para medicamentos de diferentes farmacias. Los precios aqu no tienen en cuenta lo que podra costar con la ayuda del seguro (puede ser ms barato con su seguro), pero el sitio web puede darle el precio si no utiliz ningn seguro.  - Puede imprimir el cupn correspondiente y llevarlo con su receta a la farmacia.  - Tambin puede pasar por nuestra oficina durante el horario de atencin regular y recoger una tarjeta de cupones de GoodRx.  - Si necesita que su receta se enve electrnicamente a una farmacia diferente, informe a nuestra oficina a travs de MyChart de [place] o por telfono  llamando al [phone] y presione la opcin 4.  

## 2022-01-30 LAB — COMPREHENSIVE METABOLIC PANEL
ALT: 23 IU/L (ref 0–32)
AST: 24 IU/L (ref 0–40)
Albumin/Globulin Ratio: 1.7 (ref 1.2–2.2)
Albumin: 4 g/dL (ref 3.9–4.9)
Alkaline Phosphatase: 97 IU/L (ref 44–121)
BUN/Creatinine Ratio: 15 (ref 9–23)
BUN: 12 mg/dL (ref 6–24)
Bilirubin Total: 0.6 mg/dL (ref 0.0–1.2)
CO2: 24 mmol/L (ref 20–29)
Calcium: 9.3 mg/dL (ref 8.7–10.2)
Chloride: 103 mmol/L (ref 96–106)
Creatinine, Ser: 0.78 mg/dL (ref 0.57–1.00)
Globulin, Total: 2.4 g/dL (ref 1.5–4.5)
Glucose: 94 mg/dL (ref 70–99)
Potassium: 4.1 mmol/L (ref 3.5–5.2)
Sodium: 142 mmol/L (ref 134–144)
Total Protein: 6.4 g/dL (ref 6.0–8.5)
eGFR: 94 mL/min/{1.73_m2} (ref >59)

## 2022-01-30 LAB — TSH: TSH: 2.75 u[IU]/mL (ref 0.450–4.500)

## 2022-01-30 LAB — VITAMIN B12: Vitamin B-12: 823 pg/mL (ref 232–1245)

## 2022-02-27 ENCOUNTER — Other Ambulatory Visit: Payer: Self-pay | Admitting: Internal Medicine

## 2022-03-19 ENCOUNTER — Ambulatory Visit: Payer: Managed Care, Other (non HMO) | Admitting: Internal Medicine

## 2022-03-24 ENCOUNTER — Telehealth: Payer: Self-pay

## 2022-03-24 ENCOUNTER — Ambulatory Visit (INDEPENDENT_AMBULATORY_CARE_PROVIDER_SITE_OTHER): Payer: Managed Care, Other (non HMO) | Admitting: Internal Medicine

## 2022-03-24 ENCOUNTER — Encounter: Payer: Self-pay | Admitting: Internal Medicine

## 2022-03-24 VITALS — BP 106/70 | HR 69 | Temp 98.2°F | Resp 18 | Ht 67.0 in | Wt 141.4 lb

## 2022-03-24 DIAGNOSIS — Z1231 Encounter for screening mammogram for malignant neoplasm of breast: Secondary | ICD-10-CM

## 2022-03-24 DIAGNOSIS — D508 Other iron deficiency anemias: Secondary | ICD-10-CM

## 2022-03-24 DIAGNOSIS — I10 Essential (primary) hypertension: Secondary | ICD-10-CM | POA: Diagnosis not present

## 2022-03-24 DIAGNOSIS — I421 Obstructive hypertrophic cardiomyopathy: Secondary | ICD-10-CM

## 2022-03-24 DIAGNOSIS — E039 Hypothyroidism, unspecified: Secondary | ICD-10-CM

## 2022-03-24 DIAGNOSIS — F341 Dysthymic disorder: Secondary | ICD-10-CM | POA: Diagnosis not present

## 2022-03-24 DIAGNOSIS — E559 Vitamin D deficiency, unspecified: Secondary | ICD-10-CM

## 2022-03-24 DIAGNOSIS — D696 Thrombocytopenia, unspecified: Secondary | ICD-10-CM

## 2022-03-24 DIAGNOSIS — K219 Gastro-esophageal reflux disease without esophagitis: Secondary | ICD-10-CM

## 2022-03-24 DIAGNOSIS — Z95810 Presence of automatic (implantable) cardiac defibrillator: Secondary | ICD-10-CM

## 2022-03-24 DIAGNOSIS — G43119 Migraine with aura, intractable, without status migrainosus: Secondary | ICD-10-CM

## 2022-03-24 NOTE — Progress Notes (Signed)
Patient ID: Carol Mahoney, female   DOB: 28-Jan-1976, 46 y.o.   MRN: [id number]   Subjective:    Patient ID: Carol Mahoney, female    DOB: 1975-07-19, 46 y.o.   MRN: [id number]   Patient here for  Chief Complaint  Patient presents with   Follow-up   .   HPI Here to follow up regarding increased stres and HCM s/p myectomy and hypertension.  Saw Dr Mina Marble 02/23/22 - stable.  No changes made.  Sees neurology for f/u migraine.  Has imitrex to take prn. Overall feels stable from cardiac standpoint.  No chest pain.  Breathing stable.  Not needing fluid pill.  Eating.  GI - stable.  Seeing psychiatry.  Increased stress.  Discussed.    Past Medical History:  Diagnosis Date   Anemia    Anxiety    Depression    GERD (gastroesophageal reflux disease)    H/O hiatal hernia    Hypertension    Hypertr obst cardiomyop    mitral regurgitation, LAE - followed at [facility]   Hypothyroidism    Migraine headache    PONV (postoperative nausea and vomiting)    Past Surgical History:  Procedure Laterality Date   BUNIONECTOMY     CHOLECYSTECTOMY     COLONOSCOPY WITH PROPOFOL N/A 09/10/2021   Procedure: COLONOSCOPY WITH PROPOFOL;  Surgeon: Lucilla Lame, MD;  Location: [facility] ENDOSCOPY;  Service: Endoscopy;  Laterality: N/A;   ESOPHAGOGASTRODUODENOSCOPY N/A 09/10/2021   Procedure: ESOPHAGOGASTRODUODENOSCOPY (EGD);  Surgeon: Lucilla Lame, MD;  Location: [facility] ENDOSCOPY;  Service: Endoscopy;  Laterality: N/A;   HIATAL HERNIA REPAIR     IMPLANTABLE CARDIOVERTER DEFIBRILLATOR REVISION  05/21/2014   [facility]   PARTIAL GASTRECTOMY  2010   TONSILLECTOMY     Family History  Problem Relation Age of Onset   Anesthesia problems Father    Hypertension Mother    Hashimoto's thyroiditis Maternal Grandmother    Hypertension Maternal Grandmother    Melanoma Maternal Grandmother    Breast cancer Neg Hx    Colon cancer Neg Hx    Social History   Socioeconomic History   Marital status: Single    Spouse name: Not on file    Number of children: 1   Years of education: Not on file   Highest education level: Not on file  Occupational History   Occupation: PROGRAM ANALYST    Employer: [employer]  Tobacco Use   Smoking status: Never   Smokeless tobacco: Never  Vaping Use   Vaping Use: Never used  Substance and Sexual Activity   Alcohol use: No    Alcohol/week: 0.0 standard drinks of alcohol   Drug use: No   Sexual activity: Not Currently  Other Topics Concern   Not on file  Social History Narrative   Not on file   Social Determinants of Health   Financial Resource Strain: Not on file  Food Insecurity: Not on file  Transportation Needs: Not on file  Physical Activity: Not on file  Stress: Not on file  Social Connections: Not on file     Review of Systems  Constitutional:  Negative for appetite change and unexpected weight change.  HENT:  Negative for congestion and sinus pressure.   Respiratory:  Negative for cough, chest tightness and shortness of breath.   Cardiovascular:  Negative for chest pain, palpitations and leg swelling.  Gastrointestinal:  Negative for vomiting.       Persistent GI issues as outlined.  Stable.   Genitourinary:  Negative  for difficulty urinating and dysuria.  Musculoskeletal:  Negative for joint swelling and myalgias.  Skin:  Negative for color change and rash.  Neurological:  Negative for dizziness, light-headedness and headaches.  Psychiatric/Behavioral:  Negative for agitation and dysphoric mood.        Objective:     BP 106/70 (BP Location: Left Arm, Patient Position: Sitting, Cuff Size: Small)   Pulse 69   Temp 98.2 F (36.8 C) (Temporal)   Resp 18   Ht '5\' 7"'$  (1.702 m)   Wt 141 lb 6.4 oz (64.1 kg)   LMP 10/08/2011   SpO2 98%   BMI 22.15 kg/m  Wt Readings from Last 3 Encounters:  03/24/22 141 lb 6.4 oz (64.1 kg)  12/17/21 140 lb (63.5 kg)  09/17/21 147 lb (66.7 kg)    Physical Exam Vitals reviewed.  Constitutional:      General: She is not in  acute distress.    Appearance: Normal appearance.  HENT:     Head: Normocephalic and atraumatic.     Right Ear: External ear normal.     Left Ear: External ear normal.  Eyes:     General: No scleral icterus.       Right eye: No discharge.        Left eye: No discharge.     Conjunctiva/sclera: Conjunctivae normal.  Neck:     Thyroid: No thyromegaly.  Cardiovascular:     Rate and Rhythm: Normal rate and regular rhythm.  Pulmonary:     Effort: No respiratory distress.     Breath sounds: Normal breath sounds. No wheezing.  Abdominal:     General: Bowel sounds are normal.     Palpations: Abdomen is soft.     Tenderness: There is no abdominal tenderness.  Musculoskeletal:        General: No swelling or tenderness.     Cervical back: Neck supple. No tenderness.  Lymphadenopathy:     Cervical: No cervical adenopathy.  Skin:    Findings: No erythema or rash.  Neurological:     Mental Status: She is alert.  Psychiatric:        Mood and Affect: Mood normal.        Behavior: Behavior normal.      Outpatient Encounter Medications as of 03/24/2022  Medication Sig   ALPRAZolam (XANAX) 0.5 MG tablet 0.5 mg 3 (three) times daily as needed.    buPROPion (WELLBUTRIN XL) 150 MG 24 hr tablet Take 150 mg by mouth every morning.   Cholecalciferol 25 MCG (1000 UT) capsule Take 1,000 Units by mouth daily.   Ciclopirox 1 % shampoo Massage into scalp and let sit several minutes before rinsing. Use QD until improved, then 2-3x/wk for maintenance.   clobetasol (TEMOVATE) 0.05 % external solution Apply 1 Application topically 2 (two) times daily. Apply 1-2 gtts to aa scalp bid until clear, then prn flares, avoid face, groin, axilla   clobetasol cream (TEMOVATE) 0.05 % Apply to itchy rash QD-BID PRN. Avoid applying to face, groin, and axilla. Use as directed. Long-term use can cause thinning of the skin.   diphenoxylate-atropine (LOMOTIL) 2.5-0.025 MG tablet Take 1 tablet by mouth 4 (four) times daily  as needed for diarrhea or loose stools.   EPINEPHrine (EPIPEN 2-PAK) 0.3 mg/0.3 mL IJ SOAJ injection USE AS DIRECTED ON PACKAGE   Ivermectin (SOOLANTRA) 1 % CREA Apply to face qhs for rosacea.   levothyroxine (SYNTHROID) 100 MCG tablet TAKE 1 TABLET(100 MCG) BY MOUTH DAILY BEFORE BREAKFAST  Mavacamten (CAMZYOS) 5 MG CAPS Take by mouth.   Mavacamten 5 MG CAPS Take by mouth.   ondansetron (ZOFRAN) 4 MG tablet Take 1 tablet (4 mg total) by mouth 2 (two) times daily as needed for nausea or vomiting.   pantoprazole (PROTONIX) 40 MG tablet TAKE 1 TABLET BY MOUTH DAILY   Risankizumab-rzaa (SKYRIZI PEN) 150 MG/ML SOAJ Inject 150 mg into the skin as directed. Every 12 weeks for maintenance.   SUMAtriptan (IMITREX) 50 MG tablet Take 1 tablet by mouth  every 2 hours as needed for migraine(s)   Tapinarof (VTAMA) 1 % CREA Apply 1 application topically daily. Apply to affected areas face, underarms, body once a day for psoriasis.   vitamin B-12 (CYANOCOBALAMIN) 1000 MCG tablet Take 1 tablet (1,000 mcg total) by mouth daily.   [DISCONTINUED] Risankizumab-rzaa (SKYRIZI PEN) 150 MG/ML SOAJ Inject 150 mg into the skin as directed. At weeks 0 & 4.   [DISCONTINUED] Sodium Sulfate-Mag Sulfate-KCl (SUTAB) [phone] MG TABS Per instructions given   [DISCONTINUED] TRINTELLIX 20 MG TABS tablet TK 1 T PO QAM   No facility-administered encounter medications on file as of 03/24/2022.     Lab Results  Component Value Date   WBC 3.7 06/24/2021   HGB 13.2 06/24/2021   HCT 38.0 06/24/2021   PLT 160 06/24/2021   GLUCOSE 94 01/29/2022   CHOL 184 06/24/2021   TRIG 55 06/24/2021   HDL 75 06/24/2021   LDLCALC 98 06/24/2021   ALT 23 01/29/2022   AST 24 01/29/2022   NA 142 01/29/2022   K 4.1 01/29/2022   CL 103 01/29/2022   CREATININE 0.78 01/29/2022   BUN 12 01/29/2022   CO2 24 01/29/2022   TSH 2.750 01/29/2022   INR 1.0 09/26/2014   HGBA1C 4.9 12/12/2014       Assessment & Plan:   Problem List Items  Addressed This Visit     ANXIETY DEPRESSION    Discussed today - increased stress - home/family stress.  Seeing Dr Toy Care.  Notify me if feels needs any further intervention.       Relevant Medications   buPROPion (WELLBUTRIN XL) 150 MG 24 hr tablet   GERD (gastroesophageal reflux disease)    On protonix.  No upper symptoms reported.        Hypertension    Blood pressure as outlined.  Overall stable.  On no medication. Continue f/u with cardiology.       Hypertrophic obstructive cardiomyopathy (HCC)    S/p septal myomectomy.  S/p ICD placement.  Saw Dr Mina Marble. Stable.       Hypothyroidism    On thyroid replacement.  Follow tsh.       ICD (implantable cardioverter-defibrillator) in place    Has been followed by Dr Mina Marble.       Intractable migraine with aura without status migrainosus    Seeing neurology.  Has imitrex to take prn.       Relevant Medications   buPROPion (WELLBUTRIN XL) 150 MG 24 hr tablet   Iron deficiency anemia refractory to iron therapy    Has been evaluated by hematology.  Previously received iron infusions.  Follow cbc and iron studies.        Thrombocytopenia ([place])    Follow cbc.       Vitamin D deficiency    Continue vitamin D supplements.       Other Visit Diagnoses     Encounter for screening mammogram for malignant neoplasm of breast    -  Primary   Relevant Orders   MM 3D SCREEN BREAST BILATERAL        Einar Pheasant, MD

## 2022-03-24 NOTE — Telephone Encounter (Signed)
PT ADVISED MAMMO SCHED : 11/30 AT [place]

## 2022-03-25 ENCOUNTER — Encounter: Payer: Self-pay | Admitting: Podiatry

## 2022-03-25 ENCOUNTER — Ambulatory Visit (INDEPENDENT_AMBULATORY_CARE_PROVIDER_SITE_OTHER): Payer: Managed Care, Other (non HMO)

## 2022-03-25 ENCOUNTER — Ambulatory Visit: Payer: Managed Care, Other (non HMO) | Admitting: Podiatry

## 2022-03-25 ENCOUNTER — Encounter: Payer: Self-pay | Admitting: Internal Medicine

## 2022-03-25 DIAGNOSIS — M7751 Other enthesopathy of right foot: Secondary | ICD-10-CM

## 2022-03-25 DIAGNOSIS — M778 Other enthesopathies, not elsewhere classified: Secondary | ICD-10-CM | POA: Diagnosis not present

## 2022-03-25 DIAGNOSIS — S93311A Subluxation of tarsal joint of right foot, initial encounter: Secondary | ICD-10-CM | POA: Diagnosis not present

## 2022-03-25 DIAGNOSIS — M79671 Pain in right foot: Secondary | ICD-10-CM | POA: Insufficient documentation

## 2022-03-25 NOTE — Progress Notes (Signed)
Subjective:  Patient ID: Carol Mahoney, female    DOB: 1975-11-25,  MRN: [id number]  Chief Complaint  Patient presents with   Foot Injury    "Several weeks ago, I hurt my right foot." N - heel pain L - lateral D - 2-3 weeks O - suddenly, hurts intermittenly C - sharp pains, tender A - tip toes, kick something T - none    46 y.o. female presents with the above complaint.  Patient presents with right lateral foot pain.  Patient states been going on for 2 to 3 weeks suddenly came out of nowhere.  Sharp pains tender tiptoes and kicks something aggravates it.  No treatment options have been tried.  She wanted to get it evaluated.  She does not recall any acute injury to the area.  She has not seen anyone else prior to seeing me for this.   Review of Systems: Negative except as noted in the HPI. Denies N/V/F/Ch.  Past Medical History:  Diagnosis Date   Anemia    Anxiety    Depression    GERD (gastroesophageal reflux disease)    H/O hiatal hernia    Hypertension    Hypertr obst cardiomyop    mitral regurgitation, LAE - followed at [facility]   Hypothyroidism    Migraine headache    PONV (postoperative nausea and vomiting)     Current Outpatient Medications:    ALPRAZolam (XANAX) 0.5 MG tablet, 0.5 mg 3 (three) times daily as needed. , Disp: , Rfl:    buPROPion (WELLBUTRIN XL) 150 MG 24 hr tablet, Take 150 mg by mouth every morning., Disp: , Rfl:    Cholecalciferol 25 MCG (1000 UT) capsule, Take 1,000 Units by mouth daily., Disp: , Rfl:    Ciclopirox 1 % shampoo, Massage into scalp and let sit several minutes before rinsing. Use QD until improved, then 2-3x/wk for maintenance., Disp: 120 mL, Rfl: 5   clobetasol (TEMOVATE) 0.05 % external solution, Apply 1 Application topically 2 (two) times daily. Apply 1-2 gtts to aa scalp bid until clear, then prn flares, avoid face, groin, axilla, Disp: 50 mL, Rfl: 1   clobetasol cream (TEMOVATE) 0.05 %, Apply to itchy rash QD-BID PRN. Avoid applying  to face, groin, and axilla. Use as directed. Long-term use can cause thinning of the skin., Disp: 30 g, Rfl: 0   diphenoxylate-atropine (LOMOTIL) 2.5-0.025 MG tablet, Take 1 tablet by mouth 4 (four) times daily as needed for diarrhea or loose stools., Disp: 120 tablet, Rfl: 5   EPINEPHrine (EPIPEN 2-PAK) 0.3 mg/0.3 mL IJ SOAJ injection, USE AS DIRECTED ON PACKAGE, Disp: 2 each, Rfl: 0   Ivermectin (SOOLANTRA) 1 % CREA, Apply to face qhs for rosacea., Disp: 45 g, Rfl: 5   levothyroxine (SYNTHROID) 100 MCG tablet, TAKE 1 TABLET(100 MCG) BY MOUTH DAILY BEFORE BREAKFAST, Disp: 90 tablet, Rfl: 1   Mavacamten (CAMZYOS) 5 MG CAPS, Take by mouth., Disp: , Rfl:    Mavacamten 5 MG CAPS, Take by mouth., Disp: , Rfl:    ondansetron (ZOFRAN) 4 MG tablet, Take 1 tablet (4 mg total) by mouth 2 (two) times daily as needed for nausea or vomiting., Disp: 20 tablet, Rfl: 0   pantoprazole (PROTONIX) 40 MG tablet, TAKE 1 TABLET BY MOUTH DAILY, Disp: 90 tablet, Rfl: 3   Risankizumab-rzaa (SKYRIZI PEN) 150 MG/ML SOAJ, Inject 150 mg into the skin as directed. Every 12 weeks for maintenance., Disp: 1 mL, Rfl: 5   SUMAtriptan (IMITREX) 50 MG tablet, Take 1  tablet by mouth  every 2 hours as needed for migraine(s), Disp: 54 tablet, Rfl: 2   Tapinarof (VTAMA) 1 % CREA, Apply 1 application topically daily. Apply to affected areas face, underarms, body once a day for psoriasis., Disp: 60 g, Rfl: 3   vitamin B-12 (CYANOCOBALAMIN) 1000 MCG tablet, Take 1 tablet (1,000 mcg total) by mouth daily., Disp: 30 tablet, Rfl: 3   Risankizumab-rzaa (SKYRIZI PEN) 150 MG/ML SOAJ, Inject 150 mg into the skin as directed. At weeks 0 & 4., Disp: 1 mL, Rfl: 1   Sodium Sulfate-Mag Sulfate-KCl (SUTAB) [phone] MG TABS, Per instructions given, Disp: 24 tablet, Rfl: 0  Social History   Tobacco Use  Smoking Status Never  Smokeless Tobacco Never    Allergies  Allergen Reactions   Carafate [Sucralfate] Rash and Hives   Chloraprep One Step  [Chlorhexidine Gluconate] Itching and Hives   Shellfish-Derived Products Swelling    Other reaction(s): Angioedema Esophageal and lip swelling   Vancomycin Itching and Dermatitis   Penicillins Hives and Rash   Sulfa Antibiotics Hives and Rash   Doxycycline Other (See Comments)    Swallowing - Per dermatology   Soap    Trimethoprim     Other reaction(s): Unknown   Betadine [Povidone Iodine] Hives and Rash   Iodine Rash   Objective:  There were no vitals filed for this visit. There is no height or weight on file to calculate BMI. Constitutional Well developed. Well nourished.  Vascular Dorsalis pedis pulses palpable bilaterally. Posterior tibial pulses palpable bilaterally. Capillary refill normal to all digits.  No cyanosis or clubbing noted. Pedal hair growth normal.  Neurologic Normal speech. Oriented to person, place, and time. Epicritic sensation to light touch grossly present bilaterally.  Dermatologic Nails well groomed and normal in appearance. No open wounds. No skin lesions.  Orthopedic: Pain on palpation of right lateral foot pain at the calcaneocuboid joint.  Mild pain with range of motion of the joint.  No pain at the subtalar joint or ankle joint no pain at the calcaneal tuber.  No signs of Planter fasciitis or Achilles tendinitis noted.   Radiographs: 3 views of skeletally mature the right ankleAll: No signs of osteoarthritis noted.  No bony abnormalities identified.  Previous hardware noted from previous bunion surgery.  Calcaneus within neutral position Assessment:   1. Tendinitis of right ankle    Plan:  Patient was evaluated and treated and all questions answered.  Right calcaneocuboid syndrome with underlying capsulitis -All questions and concerns were discussed with the patient in extensive detail -I explained to the patient the etiology of calcaneocuboid syndrome and various treatment options were discussed.  Shoe gear modification was discussed.  Given  the amount of pain that she is having she will benefit from a steroid injection in the calcaneocuboid joint I discussed the pros and cons of the injection she states understand would like to proceed with injection -A steroid injection was performed at right calcaneocuboid joint using 1% plain Lidocaine and 10 mg of Kenalog. This was well tolerated.   No follow-ups on file.

## 2022-04-04 ENCOUNTER — Encounter: Payer: Self-pay | Admitting: Internal Medicine

## 2022-04-04 NOTE — Assessment & Plan Note (Signed)
S/p septal myomectomy.  S/p ICD placement.  Saw Dr Mina Marble. Stable.

## 2022-04-04 NOTE — Assessment & Plan Note (Signed)
Has been followed by Dr Mina Marble.

## 2022-04-04 NOTE — Assessment & Plan Note (Signed)
On protonix.  No upper symptoms reported.   

## 2022-04-04 NOTE — Assessment & Plan Note (Signed)
On thyroid replacement.  Follow tsh.  

## 2022-04-04 NOTE — Assessment & Plan Note (Signed)
Seeing neurology.  Has imitrex to take prn.

## 2022-04-04 NOTE — Assessment & Plan Note (Signed)
Follow cbc.  

## 2022-04-04 NOTE — Assessment & Plan Note (Signed)
Blood pressure as outlined.  Overall stable.  On no medication. Continue f/u with cardiology.

## 2022-04-04 NOTE — Assessment & Plan Note (Signed)
Continue vitamin D supplements.  

## 2022-04-04 NOTE — Assessment & Plan Note (Signed)
Has been evaluated by hematology.  Previously received iron infusions.  Follow cbc and iron studies.

## 2022-04-04 NOTE — Assessment & Plan Note (Signed)
Discussed today - increased stress - home/family stress.  Seeing Dr Toy Care.  Notify me if feels needs any further intervention.

## 2022-05-12 ENCOUNTER — Ambulatory Visit
Admission: RE | Admit: 2022-05-12 | Discharge: 2022-05-12 | Disposition: A | Discharge status: Home / Self Care | Payer: Managed Care, Other (non HMO) | Source: Ambulatory Visit | Attending: Internal Medicine | Admitting: Internal Medicine

## 2022-05-12 ENCOUNTER — Encounter: Payer: Self-pay | Admitting: Internal Medicine

## 2022-05-12 DIAGNOSIS — Z1231 Encounter for screening mammogram for malignant neoplasm of breast: Secondary | ICD-10-CM | POA: Insufficient documentation

## 2022-05-12 DIAGNOSIS — M79673 Pain in unspecified foot: Secondary | ICD-10-CM | POA: Insufficient documentation

## 2022-06-24 ENCOUNTER — Ambulatory Visit: Payer: Managed Care, Other (non HMO) | Admitting: Internal Medicine

## 2022-06-24 VITALS — BP 128/72 | HR 73 | Temp 98.3°F | Ht 67.0 in | Wt 133.0 lb

## 2022-06-24 DIAGNOSIS — E559 Vitamin D deficiency, unspecified: Secondary | ICD-10-CM

## 2022-06-24 DIAGNOSIS — Z95810 Presence of automatic (implantable) cardiac defibrillator: Secondary | ICD-10-CM

## 2022-06-24 DIAGNOSIS — I10 Essential (primary) hypertension: Secondary | ICD-10-CM

## 2022-06-24 DIAGNOSIS — L900 Lichen sclerosus et atrophicus: Secondary | ICD-10-CM

## 2022-06-24 DIAGNOSIS — M79673 Pain in unspecified foot: Secondary | ICD-10-CM | POA: Diagnosis not present

## 2022-06-24 DIAGNOSIS — F439 Reaction to severe stress, unspecified: Secondary | ICD-10-CM

## 2022-06-24 DIAGNOSIS — D508 Other iron deficiency anemias: Secondary | ICD-10-CM

## 2022-06-24 DIAGNOSIS — D696 Thrombocytopenia, unspecified: Secondary | ICD-10-CM

## 2022-06-24 DIAGNOSIS — I421 Obstructive hypertrophic cardiomyopathy: Secondary | ICD-10-CM | POA: Diagnosis not present

## 2022-06-24 DIAGNOSIS — K580 Irritable bowel syndrome with diarrhea: Secondary | ICD-10-CM

## 2022-06-24 DIAGNOSIS — K219 Gastro-esophageal reflux disease without esophagitis: Secondary | ICD-10-CM

## 2022-06-24 DIAGNOSIS — I4729 Other ventricular tachycardia: Secondary | ICD-10-CM

## 2022-06-24 DIAGNOSIS — E039 Hypothyroidism, unspecified: Secondary | ICD-10-CM

## 2022-06-24 NOTE — Progress Notes (Signed)
Subjective:    Patient ID: Carol Mahoney, female    DOB: 1975-08-28, 47 y.o.   MRN: [id number]  Patient here for  Chief Complaint  Patient presents with   Medical Management of Chronic Issues    HPI Here to follow up regarding increased stress and HCM s/p myectomy and hypertension.  Saw Dr Mina Marble 02/2022 - stable.  No changes made.  She has been doing well from a cardiac standpoint.  Overall swelling has been better.  No chest pain.  Breathing stable.  No increased cough or congestion.  No abdominal pain.  Bowels stable.  Increased stress.  Persistent.  Discussed.     Past Medical History:  Diagnosis Date   Anemia    Anxiety    Depression    GERD (gastroesophageal reflux disease)    H/O hiatal hernia    Hypertension    Hypertr obst cardiomyop    mitral regurgitation, LAE - followed at [facility]   Hypothyroidism    Migraine headache    PONV (postoperative nausea and vomiting)    Past Surgical History:  Procedure Laterality Date   BUNIONECTOMY     CHOLECYSTECTOMY     COLONOSCOPY WITH PROPOFOL N/A 09/10/2021   Procedure: COLONOSCOPY WITH PROPOFOL;  Surgeon: Lucilla Lame, MD;  Location: [facility] ENDOSCOPY;  Service: Endoscopy;  Laterality: N/A;   ESOPHAGOGASTRODUODENOSCOPY N/A 09/10/2021   Procedure: ESOPHAGOGASTRODUODENOSCOPY (EGD);  Surgeon: Lucilla Lame, MD;  Location: [facility] ENDOSCOPY;  Service: Endoscopy;  Laterality: N/A;   HIATAL HERNIA REPAIR     IMPLANTABLE CARDIOVERTER DEFIBRILLATOR REVISION  05/21/2014   [facility]   PARTIAL GASTRECTOMY  2010   TONSILLECTOMY     Family History  Problem Relation Age of Onset   Anesthesia problems Father    Hypertension Mother    Hashimoto's thyroiditis Maternal Grandmother    Hypertension Maternal Grandmother    Melanoma Maternal Grandmother    Breast cancer Neg Hx    Colon cancer Neg Hx    Social History   Socioeconomic History   Marital status: Single    Spouse name: Not on file   Number of children: 1   Years of education: Not on file    Highest education level: Not on file  Occupational History   Occupation: PROGRAM ANALYST    Employer: [employer]  Tobacco Use   Smoking status: Never   Smokeless tobacco: Never  Vaping Use   Vaping Use: Never used  Substance and Sexual Activity   Alcohol use: No    Alcohol/week: 0.0 standard drinks of alcohol   Drug use: No   Sexual activity: Not Currently  Other Topics Concern   Not on file  Social History Narrative   Not on file   Social Determinants of Health   Financial Resource Strain: Not on file  Food Insecurity: Not on file  Transportation Needs: Not on file  Physical Activity: Not on file  Stress: Not on file  Social Connections: Not on file     Review of Systems  Constitutional:  Negative for appetite change and unexpected weight change.  HENT:  Negative for congestion and sinus pressure.   Respiratory:  Negative for cough and chest tightness.        Breathing stable.   Cardiovascular:  Negative for chest pain and palpitations.       No increased lower extremity swelling.   Gastrointestinal:  Negative for nausea and vomiting.       Bowels stable.   Genitourinary:  Negative for difficulty urinating  and dysuria.  Musculoskeletal:  Negative for joint swelling and myalgias.  Skin:  Negative for color change and rash.  Neurological:  Negative for dizziness and headaches.  Psychiatric/Behavioral:  Negative for agitation and dysphoric mood.        Objective:     LMP 10/08/2011  Wt Readings from Last 3 Encounters:  03/24/22 141 lb 6.4 oz (64.1 kg)  12/17/21 140 lb (63.5 kg)  09/17/21 147 lb (66.7 kg)    Physical Exam Vitals reviewed.  Constitutional:      General: She is not in acute distress.    Appearance: Normal appearance.  HENT:     Head: Normocephalic and atraumatic.     Right Ear: External ear normal.     Left Ear: External ear normal.  Eyes:     General: No scleral icterus.       Right eye: No discharge.        Left eye: No discharge.      Conjunctiva/sclera: Conjunctivae normal.  Neck:     Thyroid: No thyromegaly.  Cardiovascular:     Rate and Rhythm: Normal rate and regular rhythm.  Pulmonary:     Effort: No respiratory distress.     Breath sounds: Normal breath sounds. No wheezing.  Abdominal:     General: Bowel sounds are normal.     Palpations: Abdomen is soft.     Tenderness: There is no abdominal tenderness.  Musculoskeletal:        General: No swelling or tenderness.     Cervical back: Neck supple. No tenderness.  Lymphadenopathy:     Cervical: No cervical adenopathy.  Skin:    Findings: No erythema or rash.  Neurological:     Mental Status: She is alert.  Psychiatric:        Mood and Affect: Mood normal.        Behavior: Behavior normal.      Outpatient Encounter Medications as of 06/24/2022  Medication Sig   ALPRAZolam (XANAX) 0.5 MG tablet 0.5 mg 3 (three) times daily as needed.    buPROPion (WELLBUTRIN XL) 150 MG 24 hr tablet Take 150 mg by mouth every morning.   Cholecalciferol 25 MCG (1000 UT) capsule Take 1,000 Units by mouth daily.   Ciclopirox 1 % shampoo Massage into scalp and let sit several minutes before rinsing. Use QD until improved, then 2-3x/wk for maintenance.   clobetasol (TEMOVATE) 0.05 % external solution Apply 1 Application topically 2 (two) times daily. Apply 1-2 gtts to aa scalp bid until clear, then prn flares, avoid face, groin, axilla   clobetasol cream (TEMOVATE) 0.05 % Apply to itchy rash QD-BID PRN. Avoid applying to face, groin, and axilla. Use as directed. Long-term use can cause thinning of the skin.   diphenoxylate-atropine (LOMOTIL) 2.5-0.025 MG tablet Take 1 tablet by mouth 4 (four) times daily as needed for diarrhea or loose stools.   EPINEPHrine (EPIPEN 2-PAK) 0.3 mg/0.3 mL IJ SOAJ injection USE AS DIRECTED ON PACKAGE   Ivermectin (SOOLANTRA) 1 % CREA Apply to face qhs for rosacea.   levothyroxine (SYNTHROID) 100 MCG tablet TAKE 1 TABLET(100 MCG) BY MOUTH DAILY BEFORE  BREAKFAST   Mavacamten (CAMZYOS) 5 MG CAPS Take by mouth.   Mavacamten 5 MG CAPS Take by mouth.   ondansetron (ZOFRAN) 4 MG tablet Take 1 tablet (4 mg total) by mouth 2 (two) times daily as needed for nausea or vomiting.   pantoprazole (PROTONIX) 40 MG tablet TAKE 1 TABLET BY MOUTH DAILY  Risankizumab-rzaa (SKYRIZI PEN) 150 MG/ML SOAJ Inject 150 mg into the skin as directed. Every 12 weeks for maintenance.   SUMAtriptan (IMITREX) 50 MG tablet Take 1 tablet by mouth  every 2 hours as needed for migraine(s)   Tapinarof (VTAMA) 1 % CREA Apply 1 application topically daily. Apply to affected areas face, underarms, body once a day for psoriasis.   vitamin B-12 (CYANOCOBALAMIN) 1000 MCG tablet Take 1 tablet (1,000 mcg total) by mouth daily.   No facility-administered encounter medications on file as of 06/24/2022.     Lab Results  Component Value Date   WBC 3.7 06/24/2021   HGB 13.2 06/24/2021   HCT 38.0 06/24/2021   PLT 160 06/24/2021   GLUCOSE 94 01/29/2022   CHOL 184 06/24/2021   TRIG 55 06/24/2021   HDL 75 06/24/2021   LDLCALC 98 06/24/2021   ALT 23 01/29/2022   AST 24 01/29/2022   NA 142 01/29/2022   K 4.1 01/29/2022   CL 103 01/29/2022   CREATININE 0.78 01/29/2022   BUN 12 01/29/2022   CO2 24 01/29/2022   TSH 2.750 01/29/2022   INR 1.0 09/26/2014   HGBA1C 4.9 12/12/2014    MM 3D SCREEN BREAST BILATERAL  Result Date: 05/13/2022 CLINICAL DATA:  Screening. EXAM: DIGITAL SCREENING BILATERAL MAMMOGRAM WITH TOMOSYNTHESIS AND CAD TECHNIQUE: Bilateral screening digital craniocaudal and mediolateral oblique mammograms were obtained. Bilateral screening digital breast tomosynthesis was performed. The images were evaluated with computer-aided detection. COMPARISON:  Previous exam(s). ACR Breast Density Category c: The breast tissue is heterogeneously dense, which may obscure small masses. FINDINGS: There are no findings suspicious for malignancy. IMPRESSION: No mammographic evidence of  malignancy. A result letter of this screening mammogram will be mailed directly to the patient. RECOMMENDATION: Screening mammogram in one year. (Code:SM-B-01Y) BI-RADS CATEGORY  1: Negative. Electronically Signed   By: Everlean Alstrom M.D.   On: 05/13/2022 10:43       Assessment & Plan:  Gastroesophageal reflux disease, unspecified whether esophagitis present Assessment & Plan: On protonix.  No upper symptoms reported.     Heel pain, unspecified laterality Assessment & Plan: Being followed by [facility].    Primary hypertension Assessment & Plan: Blood pressure as outlined.  Overall stable.  On no medication. Continue f/u with cardiology.   Orders: -     Basic metabolic panel  Hypertrophic obstructive cardiomyopathy ([place]) Assessment & Plan: S/p septal myomectomy.  S/p ICD placement.  Saw Dr Mina Marble. Stable.   Orders: -     Hepatic function panel -     Lipid panel  Hypothyroidism, unspecified type Assessment & Plan: On thyroid replacement.  Follow tsh.   Orders: -     TSH  Irritable bowel syndrome with diarrhea Assessment & Plan: Stable.  No change.    ICD (implantable cardioverter-defibrillator) in place Assessment & Plan: Has been followed by Dr Mina Marble.    Iron deficiency anemia refractory to iron therapy Assessment & Plan: Has been evaluated by hematology.  Previously received iron infusions.  Follow cbc and iron studies.    Orders: -     CBC with Differential/Platelet -     Ferritin -     Vitamin T03  Lichen sclerosus Assessment & Plan: Has seen gyn.  Has used clobetasol.  Needs continued f/u with gyn.    NSVT (nonsustained ventricular tachycardia) (HCC) Assessment & Plan: Followed by Dr Mina Marble.    Stress Assessment & Plan: Increased stress.  Discussed.  Seeing psychiatry.  Continue to follow up with  her.  Continues on wellbutrin. Follow.    Thrombocytopenia ([place]) Assessment & Plan: Follow cbc.    Vitamin D deficiency Assessment & Plan: Continue  vitamin D supplements.   Orders: -     VITAMIN D 25 Hydroxy (Vit-D Deficiency, Fractures)     Einar Pheasant, MD

## 2022-06-27 ENCOUNTER — Encounter: Payer: Self-pay | Admitting: Internal Medicine

## 2022-06-27 NOTE — Assessment & Plan Note (Signed)
Followed by Dr Mina Marble.

## 2022-06-27 NOTE — Assessment & Plan Note (Signed)
Stable  No change

## 2022-06-27 NOTE — Assessment & Plan Note (Signed)
Being followed by [facility].

## 2022-06-27 NOTE — Assessment & Plan Note (Signed)
Has been evaluated by hematology.  Previously received iron infusions.  Follow cbc and iron studies.

## 2022-06-27 NOTE — Assessment & Plan Note (Signed)
Blood pressure as outlined.  Overall stable.  On no medication. Continue f/u with cardiology.

## 2022-06-27 NOTE — Assessment & Plan Note (Signed)
Follow cbc.  

## 2022-06-27 NOTE — Assessment & Plan Note (Signed)
S/p septal myomectomy.  S/p ICD placement.  Saw Dr Mina Marble. Stable.

## 2022-06-27 NOTE — Assessment & Plan Note (Signed)
Continue vitamin D supplements.  

## 2022-06-27 NOTE — Assessment & Plan Note (Addendum)
Increased stress.  Discussed.  Seeing psychiatry.  Continue to follow up with her.  Continues on wellbutrin. Follow.

## 2022-06-27 NOTE — Assessment & Plan Note (Signed)
Has seen gyn.  Has used clobetasol.  Needs continued f/u with gyn.

## 2022-06-27 NOTE — Assessment & Plan Note (Signed)
On thyroid replacement.  Follow tsh.  

## 2022-06-27 NOTE — Assessment & Plan Note (Signed)
On protonix.  No upper symptoms reported.   

## 2022-06-27 NOTE — Assessment & Plan Note (Signed)
Has been followed by Dr Mina Marble.

## 2022-07-20 ENCOUNTER — Ambulatory Visit: Payer: Managed Care, Other (non HMO) | Admitting: Dermatology

## 2022-08-13 ENCOUNTER — Other Ambulatory Visit: Payer: Self-pay | Admitting: Dermatology

## 2022-08-18 ENCOUNTER — Ambulatory Visit (INDEPENDENT_AMBULATORY_CARE_PROVIDER_SITE_OTHER): Payer: Managed Care, Other (non HMO) | Admitting: Dermatology

## 2022-08-18 VITALS — BP 86/64 | HR 67

## 2022-08-18 DIAGNOSIS — Z79899 Other long term (current) drug therapy: Secondary | ICD-10-CM

## 2022-08-18 DIAGNOSIS — L409 Psoriasis, unspecified: Secondary | ICD-10-CM | POA: Diagnosis not present

## 2022-08-18 DIAGNOSIS — L719 Rosacea, unspecified: Secondary | ICD-10-CM | POA: Diagnosis not present

## 2022-08-18 MED ORDER — IVERMECTIN 1 % EX CREA: TOPICAL_CREAM | CUTANEOUS | 5 refills | Status: DC

## 2022-08-18 MED ORDER — CLOBETASOL PROPIONATE 0.05 % EX SOLN: 1.0000 | Freq: Two times a day (BID) | CUTANEOUS | 1 refills | Status: DC

## 2022-08-18 MED ORDER — CLOBETASOL PROPIONATE 0.05 % EX SHAM: MEDICATED_SHAMPOO | CUTANEOUS | 3 refills | Status: DC

## 2022-08-18 NOTE — Progress Notes (Signed)
Follow-Up Visit   Subjective  Carol Mahoney is a 47 y.o. female who presents for the following: Psoriasis, scalp, mainly controlled with Skyrizi injections q 12 wks and using clobetasol solution prn. She is not using Ciclopirox shampoo as she didn't see much improvement.  Still has itching in scalp.  She also has Rosacea, controlled with Soolantra Cream.  The following portions of the chart were reviewed this encounter and updated as appropriate: medications, allergies, medical history  Review of Systems:  No other skin or systemic complaints except as noted in HPI or Assessment and Plan.  Objective  Well appearing patient in no apparent distress; mood and affect are within normal limits.  Areas Examined: Face, scalp.  Relevant exam findings are noted in the Assessment and Plan.      Assessment & Plan   Psoriasis  Related Procedures Comprehensive metabolic panel CBC with Differential/Platelet QuantiFERON-TB Gold Plus  Related Medications Tapinarof (VTAMA) 1 % CREA Apply 1 application topically daily. Apply to affected areas face, underarms, body once a day for psoriasis.  Ciclopirox 1 % shampoo Massage into scalp and let sit several minutes before rinsing. Use QD until improved, then 2-3x/wk for maintenance.  clobetasol (TEMOVATE) 0.05 % external solution Apply 1 Application topically 2 (two) times daily. Apply 1-2 gtts to aa scalp bid until clear, then prn flares, avoid face, groin, axilla  Clobetasol Propionate 0.05 % shampoo Massage into dry scalp 3x/wk and let sit 5-10 minutes before rinsing.  Rosacea  Related Medications Ivermectin (SOOLANTRA) 1 % CREA Apply to face qhs for rosacea.    PSORIASIS on Systemic Treatment Exam: .Mild scaling on the left posterior scalp, rest of body is clear. 1% BSA.  Chronic condition with duration or expected duration over one year. Currently well-controlled.  No joint pain.  Labs ordered today.  Psoriasis - severe  on systemic treatment.  Psoriasis is a chronic non-curable, but treatable genetic/hereditary disease that may have other systemic features affecting other organ systems such as joints (Psoriatic Arthritis).  It is linked with heart disease, inflammatory bowel disease, non-alcoholic fatty liver disease, and depression. Significant skin psoriasis and/or psoriatic arthritis may have significant symptoms and affects activities of daily activity and often benefits from systemic treatments.  These systemic treatments have some potential side effects including immunosuppression and require pre-treatment laboratory screening and periodic laboratory monitoring and periodic in person evaluation and monitoring by the attending dermatologist physician (long term medication management).    Treatment Plan: Pending labs, continue Skyrizi sq injections q 12 wks. Continue clobetasol solution Apply a few drops to AA scalp QD/BID prn itch. Avoid face, groin, axilla.  Start Clobetasol shampoo qod, apply to dry scalp 20 min prior to shower, and then wash out.   Reviewed risks of biologics including immunosuppression, infections, injection site reaction, and failure to improve condition. Goal is control of skin condition, not cure.  Some older biologics such as Humira and Enbrel may slightly increase risk of malignancy and may worsen congestive heart failure.  Talz and Cosentyx may cause inflammatory bowel disease to flare. The use of biologics requires long term medication management, including periodic office visits and monitoring of blood work.   Topical steroids (such as triamcinolone, fluocinolone, fluocinonide, mometasone, clobetasol, halobetasol, betamethasone, hydrocortisone) can cause thinning and lightening of the skin if they are used for too long in the same area. Your physician has selected the right strength medicine for your problem and area affected on the body. Please use your medication only as  directed by  your physician to prevent side effects.    ROSACEA Exam Mild erythema and telangiectasias on the nose.  Chronic condition with duration or expected duration over one year. Currently well-controlled.   Rosacea is a chronic progressive skin condition usually affecting the face of adults, causing redness and/or acne bumps. It is treatable but not curable. It sometimes affects the eyes (ocular rosacea) as well. It may respond to topical and/or systemic medication and can flare with stress, sun exposure, alcohol, exercise, topical steroids (including hydrocortisone/cortisone 10) and some foods.  Daily application of broad spectrum spf 30+ sunscreen to face is recommended to reduce flares.  Patient denies grittiness of the eyes.  Treatment Plan  Continue Soolantra Cream QHS face.   Return in about 6 months (around 02/18/2023) for Psoriasis.  IJamesetta Orleans, CMA, am acting as scribe for Brendolyn Patty, MD .   Documentation: I have reviewed the above documentation for accuracy and completeness, and I agree with the above.  Brendolyn Patty, MD

## 2022-08-18 NOTE — Patient Instructions (Signed)
Due to recent changes in healthcare laws, you may see results of your pathology and/or laboratory studies on MyChart before the doctors have had a chance to review them. We understand that in some cases there may be results that are confusing or concerning to you. Please understand that not all results are received at the same time and often the doctors may need to interpret multiple results in order to provide you with the best plan of care or course of treatment. Therefore, we ask that you please give us 2 business days to thoroughly review all your results before contacting the office for clarification. Should we see a critical lab result, you will be contacted sooner.   If You Need Anything After Your Visit  If you have any questions or concerns for your doctor, please call our main line at [phone] and press option 4 to reach your doctor's medical assistant. If no one answers, please leave a voicemail as directed and we will return your call as soon as possible. Messages left after 4 pm will be answered the following business day.   You may also send us a message via MyChart. We typically respond to MyChart messages within 1-2 business days.  For prescription refills, please ask your pharmacy to contact our office. Our fax number is [phone].  If you have an urgent issue when the clinic is closed that cannot wait until the next business day, you can page your doctor at the number below.    Please note that while we do our best to be available for urgent issues outside of office hours, we are not available 24/7.   If you have an urgent issue and are unable to reach us, you may choose to seek medical care at your doctor's office, retail clinic, urgent care center, or emergency room.  If you have a medical emergency, please immediately call 911 or go to the emergency department.  Pager Numbers  - Dr. Kowalski: [phone]  - Dr. Moye: [phone]  - Dr. Stewart:  [phone]  In the event of inclement weather, please call our main line at [phone] for an update on the status of any delays or closures.  Dermatology Medication Tips: Please keep the boxes that topical medications come in in order to help keep track of the instructions about where and how to use these. Pharmacies typically print the medication instructions only on the boxes and not directly on the medication tubes.   If your medication is too expensive, please contact our office at [phone] option 4 or send us a message through MyChart.   We are unable to tell what your co-pay for medications will be in advance as this is different depending on your insurance coverage. However, we may be able to find a substitute medication at lower cost or fill out paperwork to get insurance to cover a needed medication.   If a prior authorization is required to get your medication covered by your insurance company, please allow us 1-2 business days to complete this process.  Drug prices often vary depending on where the prescription is filled and some pharmacies may offer cheaper prices.  The website www.goodrx.com contains coupons for medications through different pharmacies. The prices here do not account for what the cost may be with help from insurance (it may be cheaper with your insurance), but the website can give you the price if you did not use any insurance.  - You can print the associated coupon and take it with   your prescription to the pharmacy.  - You may also stop by our office during regular business hours and pick up a GoodRx coupon card.  - If you need your prescription sent electronically to a different pharmacy, notify our office through [facility] MyChart or by phone at [phone] option 4.     Si Usted Necesita Algo Despus de Su Visita  Tambin puede enviarnos un mensaje a travs de MyChart. Por lo general respondemos a los mensajes de MyChart en el transcurso de 1 a 2  das hbiles.  Para renovar recetas, por favor pida a su farmacia que se ponga en contacto con nuestra oficina. Nuestro nmero de fax es el [phone].  Si tiene un asunto urgente cuando la clnica est cerrada y que no puede esperar hasta el siguiente da hbil, puede llamar/localizar a su doctor(a) al nmero que aparece a continuacin.   Por favor, tenga en cuenta que aunque hacemos todo lo posible para estar disponibles para asuntos urgentes fuera del horario de oficina, no estamos disponibles las 24 horas del da, los 7 das de la semana.   Si tiene un problema urgente y no puede comunicarse con nosotros, puede optar por buscar atencin mdica  en el consultorio de su doctor(a), en una clnica privada, en un centro de atencin urgente o en una sala de emergencias.  Si tiene una emergencia mdica, por favor llame inmediatamente al 911 o vaya a la sala de emergencias.  Nmeros de bper  - Dr. Kowalski: [phone]  - Dra. Moye: [phone]  - Dra. Stewart: [phone]  En caso de inclemencias del tiempo, por favor llame a nuestra lnea principal al [phone] para una actualizacin sobre el estado de cualquier retraso o cierre.  Consejos para la medicacin en dermatologa: Por favor, guarde las cajas en las que vienen los medicamentos de uso tpico para ayudarle a seguir las instrucciones sobre dnde y cmo usarlos. Las farmacias generalmente imprimen las instrucciones del medicamento slo en las cajas y no directamente en los tubos del medicamento.   Si su medicamento es muy caro, por favor, pngase en contacto con nuestra oficina llamando al [phone] y presione la opcin 4 o envenos un mensaje a travs de MyChart.   No podemos decirle cul ser su copago por los medicamentos por adelantado ya que esto es diferente dependiendo de la cobertura de su seguro. Sin embargo, es posible que podamos encontrar un medicamento sustituto a menor costo o llenar un formulario para que el  seguro cubra el medicamento que se considera necesario.   Si se requiere una autorizacin previa para que su compaa de seguros cubra su medicamento, por favor permtanos de 1 a 2 das hbiles para completar este proceso.  Los precios de los medicamentos varan con frecuencia dependiendo del lugar de dnde se surte la receta y alguna farmacias pueden ofrecer precios ms baratos.  El sitio web www.goodrx.com tiene cupones para medicamentos de diferentes farmacias. Los precios aqu no tienen en cuenta lo que podra costar con la ayuda del seguro (puede ser ms barato con su seguro), pero el sitio web puede darle el precio si no utiliz ningn seguro.  - Puede imprimir el cupn correspondiente y llevarlo con su receta a la farmacia.  - Tambin puede pasar por nuestra oficina durante el horario de atencin regular y recoger una tarjeta de cupones de GoodRx.  - Si necesita que su receta se enve electrnicamente a una farmacia diferente, informe a nuestra oficina a travs de MyChart de    o por telfono llamando al [phone] y presione la opcin 4.  

## 2022-08-27 LAB — QUANTIFERON-TB GOLD PLUS
QuantiFERON Mitogen Value: >10 IU/mL
QuantiFERON Nil Value: 0.06 IU/mL
QuantiFERON TB1 Ag Value: 0.03 IU/mL
QuantiFERON TB2 Ag Value: 0.05 IU/mL
QuantiFERON-TB Gold Plus: NEGATIVE

## 2022-08-27 LAB — CBC WITH DIFFERENTIAL/PLATELET
Basophils Absolute: 0.1 10*3/uL (ref 0.0–0.2)
Basos: 2 %
EOS (ABSOLUTE): 0.2 10*3/uL (ref 0.0–0.4)
Eos: 5 %
Hematocrit: 39.3 % (ref 34.0–46.6)
Hemoglobin: 13 g/dL (ref 11.1–15.9)
Immature Grans (Abs): 0 10*3/uL (ref 0.0–0.1)
Immature Granulocytes: 0 %
Lymphocytes Absolute: 1.5 10*3/uL (ref 0.7–3.1)
Lymphs: 41 %
MCH: 29.1 pg (ref 26.6–33.0)
MCHC: 33.1 g/dL (ref 31.5–35.7)
MCV: 88 fL (ref 79–97)
Monocytes Absolute: 0.3 10*3/uL (ref 0.1–0.9)
Monocytes: 7 %
Neutrophils Absolute: 1.6 10*3/uL (ref 1.4–7.0)
Neutrophils: 45 %
Platelets: 158 10*3/uL (ref 150–450)
RBC: 4.46 x10E6/uL (ref 3.77–5.28)
RDW: 12.4 % (ref 11.7–15.4)
WBC: 3.6 10*3/uL (ref 3.4–10.8)

## 2022-08-27 LAB — COMPREHENSIVE METABOLIC PANEL
ALT: 41 IU/L — ABNORMAL HIGH (ref 0–32)
AST: 37 IU/L (ref 0–40)
Albumin/Globulin Ratio: 1.7 (ref 1.2–2.2)
Albumin: 4.1 g/dL (ref 3.9–4.9)
Alkaline Phosphatase: 105 IU/L (ref 44–121)
BUN/Creatinine Ratio: 16 (ref 9–23)
BUN: 14 mg/dL (ref 6–24)
Bilirubin Total: 0.5 mg/dL (ref 0.0–1.2)
CO2: 27 mmol/L (ref 20–29)
Calcium: 9.2 mg/dL (ref 8.7–10.2)
Chloride: 103 mmol/L (ref 96–106)
Creatinine, Ser: 0.87 mg/dL (ref 0.57–1.00)
Globulin, Total: 2.4 g/dL (ref 1.5–4.5)
Glucose: 52 mg/dL — ABNORMAL LOW (ref 70–99)
Potassium: 4.2 mmol/L (ref 3.5–5.2)
Sodium: 142 mmol/L (ref 134–144)
Total Protein: 6.5 g/dL (ref 6.0–8.5)
eGFR: 82 mL/min/{1.73_m2} (ref >59)

## 2022-09-01 ENCOUNTER — Telehealth: Payer: Self-pay

## 2022-09-01 MED ORDER — SKYRIZI 150 MG/ML ~~LOC~~ SOSY: PREFILLED_SYRINGE | SUBCUTANEOUS | 1 refills | Status: DC

## 2022-09-01 NOTE — Telephone Encounter (Signed)
Left message advising patient of lab results and refills of Skyrizi sent to pharmacy. Patient to call back with any questions or concerns.

## 2022-09-01 NOTE — Telephone Encounter (Signed)
-----   Message from Willeen Niece, MD sent at 09/01/2022 12:22 PM EDT ----- Labs ok, CBC normal. CMP wnl-  ALT is slightly elevated at 41, but has been slightly elevated in the past. Will observe. TB is negative, continue Syrizi - please call patient

## 2022-09-23 ENCOUNTER — Ambulatory Visit (INDEPENDENT_AMBULATORY_CARE_PROVIDER_SITE_OTHER): Payer: Managed Care, Other (non HMO) | Admitting: Internal Medicine

## 2022-09-23 ENCOUNTER — Encounter: Payer: Self-pay | Admitting: Internal Medicine

## 2022-09-23 VITALS — BP 104/70 | HR 82 | Temp 98.0°F | Resp 16 | Ht 66.0 in | Wt 131.2 lb

## 2022-09-23 DIAGNOSIS — Z95810 Presence of automatic (implantable) cardiac defibrillator: Secondary | ICD-10-CM

## 2022-09-23 DIAGNOSIS — D696 Thrombocytopenia, unspecified: Secondary | ICD-10-CM

## 2022-09-23 DIAGNOSIS — E039 Hypothyroidism, unspecified: Secondary | ICD-10-CM

## 2022-09-23 DIAGNOSIS — F439 Reaction to severe stress, unspecified: Secondary | ICD-10-CM | POA: Diagnosis not present

## 2022-09-23 DIAGNOSIS — L900 Lichen sclerosus et atrophicus: Secondary | ICD-10-CM

## 2022-09-23 DIAGNOSIS — F341 Dysthymic disorder: Secondary | ICD-10-CM

## 2022-09-23 DIAGNOSIS — I421 Obstructive hypertrophic cardiomyopathy: Secondary | ICD-10-CM

## 2022-09-23 DIAGNOSIS — R197 Diarrhea, unspecified: Secondary | ICD-10-CM

## 2022-09-23 DIAGNOSIS — K219 Gastro-esophageal reflux disease without esophagitis: Secondary | ICD-10-CM

## 2022-09-23 DIAGNOSIS — I10 Essential (primary) hypertension: Secondary | ICD-10-CM

## 2022-09-23 DIAGNOSIS — I4729 Other ventricular tachycardia: Secondary | ICD-10-CM

## 2022-09-23 DIAGNOSIS — Z0000 Encounter for general adult medical examination without abnormal findings: Secondary | ICD-10-CM

## 2022-09-23 DIAGNOSIS — K580 Irritable bowel syndrome with diarrhea: Secondary | ICD-10-CM

## 2022-09-23 DIAGNOSIS — D508 Other iron deficiency anemias: Secondary | ICD-10-CM

## 2022-09-23 NOTE — Progress Notes (Signed)
Subjective:    Patient ID: Carol Mahoney, female    DOB: 14-Aug-1975, 47 y.o.   MRN: [id number]  Patient here for  Chief Complaint  Patient presents with   Annual Exam    HPI Here for a physical exam. Still with increased stress.  Family stress with her daughter.  Discussed.  Seeing psychiatry.  Tries to stay active. No significant change in symptoms.  Cardiac - stable.  Breathing stable.  In having increased GI issues.  Has had persistent issues previously.  Describes gas pain.  Taking lamotil and also TUMS.  Persistent diarrhea and bowel issues.  Takes lamotil.  Intermittent gas pain.  Previous CT scan -Prior gastric bypass. The small gastric pouch it is partially herniated into the chest with a suggestion of partially un wrapped fundoplication. Full thickening in the jejunum is nonspecific but could reflect inflammation. There is also some mild smooth wall thickening in the descending and sigmoid colon which could reflect low-grade inflammation. Normal non-contrast appearance of the pancreas common no pseudocyst. Small fluid density structure in the right perirectal space is substantially reduced in size compared to 03/06/2011, and likely benign/incidental. Previous - calprotectin - negative. EGD 08/2021 - hiatal hernia, roux-en-Y.  Colonoscopy diverticulosis and internal hemorrhoids.  She raises the question of eosinophilic esophagitis.  Does report history of some dysphagia.  Reviewed last GI evaluation.  Dr Servando Snare had discussed doing another EGD to check for eosinophilic esophagitis if desired.  Discussed referral back to GI given history.  She request referral to [facility].     Past Medical History:  Diagnosis Date   Anemia    Anxiety    Depression    GERD (gastroesophageal reflux disease)    H/O hiatal hernia    Hypertension    Hypertr obst cardiomyop    mitral regurgitation, LAE - followed at [facility]   Hypothyroidism    Migraine headache    PONV (postoperative nausea and vomiting)     Past Surgical History:  Procedure Laterality Date   BUNIONECTOMY     CHOLECYSTECTOMY     COLONOSCOPY WITH PROPOFOL N/A 09/10/2021   Procedure: COLONOSCOPY WITH PROPOFOL;  Surgeon: Midge Minium, MD;  Location: [facility] ENDOSCOPY;  Service: Endoscopy;  Laterality: N/A;   ESOPHAGOGASTRODUODENOSCOPY N/A 09/10/2021   Procedure: ESOPHAGOGASTRODUODENOSCOPY (EGD);  Surgeon: Midge Minium, MD;  Location: [facility] ENDOSCOPY;  Service: Endoscopy;  Laterality: N/A;   HIATAL HERNIA REPAIR     IMPLANTABLE CARDIOVERTER DEFIBRILLATOR REVISION  05/21/2014   [facility]   PARTIAL GASTRECTOMY  2010   TONSILLECTOMY     Family History  Problem Relation Age of Onset   Anesthesia problems Father    Hypertension Mother    Hashimoto's thyroiditis Maternal Grandmother    Hypertension Maternal Grandmother    Melanoma Maternal Grandmother    Breast cancer Neg Hx    Colon cancer Neg Hx    Social History   Socioeconomic History   Marital status: Single    Spouse name: Not on file   Number of children: 1   Years of education: Not on file   Highest education level: Not on file  Occupational History   Occupation: PROGRAM ANALYST    Employer: [employer]  Tobacco Use   Smoking status: Never   Smokeless tobacco: Never  Vaping Use   Vaping Use: Never used  Substance and Sexual Activity   Alcohol use: No    Alcohol/week: 0.0 standard drinks of alcohol   Drug use: No   Sexual activity: Not  Currently  Other Topics Concern   Not on file  Social History Narrative   Not on file   Social Determinants of Health   Financial Resource Strain: Not on file  Food Insecurity: Not on file  Transportation Needs: Not on file  Physical Activity: Not on file  Stress: Not on file  Social Connections: Not on file     Review of Systems  Constitutional:  Negative for appetite change.       Weight is down.  10 pounds since November - 141 pounds on November.  140 pounds in July.    HENT:  Negative for congestion and sinus  pressure.   Respiratory:  Negative for cough and chest tightness.        Breathing stable.   Cardiovascular:  Negative for chest pain and palpitations.  Gastrointestinal:  Negative for nausea and vomiting.       Persistent bowel issues as outlined.  Diarrhea.  Increased gas.  Some dysphagia.   Genitourinary:  Negative for difficulty urinating and dysuria.  Musculoskeletal:  Negative for joint swelling and myalgias.  Skin:  Negative for color change and rash.  Neurological:  Negative for dizziness and headaches.  Psychiatric/Behavioral:         Increased stress as outlined.         Objective:     BP 104/70   Pulse 82   Temp 98 F (36.7 C)   Resp 16   Ht 5\' 6"  (1.676 m)   Wt 131 lb 3.2 oz (59.5 kg)   LMP 10/08/2011   SpO2 99%   BMI 21.18 kg/m  Wt Readings from Last 3 Encounters:  09/23/22 131 lb 3.2 oz (59.5 kg)  06/27/22 133 lb (60.3 kg)  03/24/22 141 lb 6.4 oz (64.1 kg)    Physical Exam Vitals reviewed.  Constitutional:      General: She is not in acute distress.    Appearance: Normal appearance.  HENT:     Head: Normocephalic and atraumatic.     Right Ear: External ear normal.     Left Ear: External ear normal.  Eyes:     General: No scleral icterus.       Right eye: No discharge.        Left eye: No discharge.     Conjunctiva/sclera: Conjunctivae normal.  Neck:     Thyroid: No thyromegaly.  Cardiovascular:     Rate and Rhythm: Normal rate and regular rhythm.  Pulmonary:     Effort: No respiratory distress.     Breath sounds: Normal breath sounds. No wheezing.  Abdominal:     General: Bowel sounds are normal.     Palpations: Abdomen is soft.     Tenderness: There is no abdominal tenderness.  Musculoskeletal:        General: No swelling or tenderness.     Cervical back: Neck supple. No tenderness.  Lymphadenopathy:     Cervical: No cervical adenopathy.  Skin:    Findings: No erythema or rash.  Neurological:     Mental Status: She is alert.   Psychiatric:        Mood and Affect: Mood normal.        Behavior: Behavior normal.      Outpatient Encounter Medications as of 09/23/2022  Medication Sig   ALPRAZolam (XANAX) 0.5 MG tablet 0.5 mg 3 (three) times daily as needed.    buPROPion (WELLBUTRIN XL) 150 MG 24 hr tablet Take 150 mg by mouth every morning.  Cholecalciferol 25 MCG (1000 UT) capsule Take 1,000 Units by mouth daily.   Ciclopirox 1 % shampoo Massage into scalp and let sit several minutes before rinsing. Use QD until improved, then 2-3x/wk for maintenance. (Patient not taking: Reported on 08/18/2022)   clobetasol (TEMOVATE) 0.05 % external solution Apply 1 Application topically 2 (two) times daily. Apply 1-2 gtts to aa scalp bid until clear, then prn flares, avoid face, groin, axilla   clobetasol cream (TEMOVATE) 0.05 % Apply to itchy rash QD-BID PRN. Avoid applying to face, groin, and axilla. Use as directed. Long-term use can cause thinning of the skin.   Clobetasol Propionate 0.05 % shampoo Massage into dry scalp 3x/wk and let sit 5-10 minutes before rinsing.   diphenoxylate-atropine (LOMOTIL) 2.5-0.025 MG tablet Take 1 tablet by mouth 4 (four) times daily as needed for diarrhea or loose stools.   EPINEPHrine (EPIPEN 2-PAK) 0.3 mg/0.3 mL IJ SOAJ injection USE AS DIRECTED ON PACKAGE   Ivermectin (SOOLANTRA) 1 % CREA Apply to face qhs for rosacea.   levothyroxine (SYNTHROID) 100 MCG tablet TAKE 1 TABLET(100 MCG) BY MOUTH DAILY BEFORE BREAKFAST   Mavacamten (CAMZYOS) 5 MG CAPS Take by mouth.   Mavacamten 5 MG CAPS Take by mouth.   ondansetron (ZOFRAN) 4 MG tablet Take 1 tablet (4 mg total) by mouth 2 (two) times daily as needed for nausea or vomiting.   pantoprazole (PROTONIX) 40 MG tablet TAKE 1 TABLET BY MOUTH DAILY   SKYRIZI 150 MG/ML SOSY INJECT 150MG  SUBCUTANEOUSLY  EVERY 12 WEEKS AS DIRECTED   SUMAtriptan (IMITREX) 50 MG tablet Take 1 tablet by mouth  every 2 hours as needed for migraine(s)   Tapinarof (VTAMA) 1 %  CREA Apply 1 application topically daily. Apply to affected areas face, underarms, body once a day for psoriasis.   vitamin B-12 (CYANOCOBALAMIN) 1000 MCG tablet Take 1 tablet (1,000 mcg total) by mouth daily.   No facility-administered encounter medications on file as of 09/23/2022.     Lab Results  Component Value Date   WBC 3.6 08/18/2022   HGB 13.0 08/18/2022   HCT 39.3 08/18/2022   PLT 158 08/18/2022   GLUCOSE 52 (L) 08/18/2022   CHOL 184 06/24/2021   TRIG 55 06/24/2021   HDL 75 06/24/2021   LDLCALC 98 06/24/2021   ALT 41 (H) 08/18/2022   AST 37 08/18/2022   NA 142 08/18/2022   K 4.2 08/18/2022   CL 103 08/18/2022   CREATININE 0.87 08/18/2022   BUN 14 08/18/2022   CO2 27 08/18/2022   TSH 2.750 01/29/2022   INR 1.0 09/26/2014   HGBA1C 4.9 12/12/2014    MM 3D SCREEN BREAST BILATERAL  Result Date: 05/13/2022 CLINICAL DATA:  Screening. EXAM: DIGITAL SCREENING BILATERAL MAMMOGRAM WITH TOMOSYNTHESIS AND CAD TECHNIQUE: Bilateral screening digital craniocaudal and mediolateral oblique mammograms were obtained. Bilateral screening digital breast tomosynthesis was performed. The images were evaluated with computer-aided detection. COMPARISON:  Previous exam(s). ACR Breast Density Category c: The breast tissue is heterogeneously dense, which may obscure small masses. FINDINGS: There are no findings suspicious for malignancy. IMPRESSION: No mammographic evidence of malignancy. A result letter of this screening mammogram will be mailed directly to the patient. RECOMMENDATION: Screening mammogram in one year. (Code:SM-B-01Y) BI-RADS CATEGORY  1: Negative. Electronically Signed   By: Edwin Cap M.D.   On: 05/13/2022 10:43       Assessment & Plan:  Routine general medical examination at a health care facility  Stress Assessment & Plan: Increased stress.  Discussed.  Seeing psychiatry.  Continue to follow up with her.  Continues on wellbutrin. Follow.    ANXIETY  DEPRESSION Assessment & Plan: Discussed today - increased stress - home/family stress.  Seeing Dr Evelene Croon.  On wellbutrin. Notify me if feels needs any further intervention.    Diarrhea, unspecified type Assessment & Plan: Has previously had GI w/up.  Colonoscopy 08/2021.  Takes lamotil.  Also TUMS.  Has tried adjusting diet, fiber, probiotics, etc.  No real change in symptoms.  Discussed f/u with GI.  Request referral to GI in [place].   Orders: -     Ambulatory referral to Gastroenterology  Gastroesophageal reflux disease, unspecified whether esophagitis present Assessment & Plan: On protonix.  Takes TUMS prn.  Some dysphagia reported.  Raised the question of eosinophilic esophagitis.  Discussed f/u with GI.  Pt requested referral to GI in [place].     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  Colonoscopy 09/10/21 - diverticulosis and internal hemorrhoids.   Recommended f/u in 10 years.  Followed by gyn.    Primary hypertension Assessment & Plan: Blood pressure as outlined.  Overall stable.  On no medication. Continue to monitor.    Hypertrophic obstructive cardiomyopathy (HCC) Assessment & Plan: S/p septal myomectomy.  S/p ICD placement.  Saw Dr Regino Schultze. Stable.    Hypothyroidism, unspecified type Assessment & Plan: On thyroid replacement.  Follow tsh.    Irritable bowel syndrome with diarrhea Assessment & Plan: Has been followed by symptoms - IBS.  W/up as outlined.  Request referral to GI in [place].    Orders: -     Ambulatory referral to Gastroenterology  ICD (implantable cardioverter-defibrillator) in place Assessment & Plan: Has been followed by Dr Regino Schultze.    Iron deficiency anemia refractory to iron therapy Assessment & Plan: Has been evaluated by hematology.  Previously received iron infusions.  Follow cbc and iron studies.    Orders: -     Ambulatory referral to Gastroenterology  Lichen sclerosus Assessment & Plan: Has seen gyn.   Has used clobetasol.  Needs continued f/u with gyn.    NSVT (nonsustained ventricular tachycardia) (HCC) Assessment & Plan: Followed by Dr Regino Schultze.    Thrombocytopenia (HCC) Assessment & Plan: Follow cbc.       Dale Bradford, MD

## 2022-09-26 ENCOUNTER — Encounter: Payer: Self-pay | Admitting: Internal Medicine

## 2022-09-26 NOTE — Assessment & Plan Note (Signed)
Has been followed by symptoms - IBS.  W/up as outlined.  Request referral to GI in [place].

## 2022-09-26 NOTE — Assessment & Plan Note (Signed)
On protonix.  Takes TUMS prn.  Some dysphagia reported.  Raised the question of eosinophilic esophagitis.  Discussed f/u with GI.  Pt requested referral to GI in [place].

## 2022-09-26 NOTE — Assessment & Plan Note (Signed)
Has seen gyn.  Has used clobetasol.  Needs continued f/u with gyn.  

## 2022-09-26 NOTE — Assessment & Plan Note (Signed)
S/p septal myomectomy.  S/p ICD placement.  Saw Dr Wang. Stable.  

## 2022-09-26 NOTE — Assessment & Plan Note (Addendum)
Discussed today - increased stress - home/family stress.  Seeing Dr Evelene Croon.  On wellbutrin. Notify me if feels needs any further intervention.

## 2022-09-26 NOTE — Assessment & Plan Note (Addendum)
Blood pressure as outlined.  Overall stable.  On no medication. Continue to monitor.

## 2022-09-26 NOTE — Assessment & Plan Note (Signed)
On thyroid replacement.  Follow tsh.  

## 2022-09-26 NOTE — Assessment & Plan Note (Signed)
Has been followed by Dr Wang.  

## 2022-09-26 NOTE — Assessment & Plan Note (Signed)
Increased stress.  Discussed.  Seeing psychiatry.  Continue to follow up with her.  Continues on wellbutrin. Follow.  

## 2022-09-26 NOTE — Assessment & Plan Note (Signed)
Has previously had GI w/up.  Colonoscopy 08/2021.  Takes lamotil.  Also TUMS.  Has tried adjusting diet, fiber, probiotics, etc.  No real change in symptoms.  Discussed f/u with GI.  Request referral to GI in [place].

## 2022-09-26 NOTE — Assessment & Plan Note (Signed)
Follow cbc.  

## 2022-09-26 NOTE — Assessment & Plan Note (Signed)
Followed by Dr Wang.  

## 2022-09-26 NOTE — Assessment & Plan Note (Signed)
Has been evaluated by hematology.  Previously received iron infusions.  Follow cbc and iron studies.   

## 2022-09-26 NOTE — Assessment & Plan Note (Addendum)
Mammogram 05/12/22 - Birads I.  Colonoscopy 09/10/21 - diverticulosis and internal hemorrhoids.   Recommended f/u in 10 years.  Followed by gyn.

## 2022-10-07 ENCOUNTER — Other Ambulatory Visit: Payer: Self-pay | Admitting: Internal Medicine

## 2022-10-30 LAB — CBC WITH DIFFERENTIAL/PLATELET
Basophils Absolute: 0.1 10*3/uL (ref 0.0–0.2)
Basos: 2 %
EOS (ABSOLUTE): 0.2 10*3/uL (ref 0.0–0.4)
Eos: 6 %
Hematocrit: 36.2 % (ref 34.0–46.6)
Hemoglobin: 11.8 g/dL (ref 11.1–15.9)
Immature Grans (Abs): 0 10*3/uL (ref 0.0–0.1)
Immature Granulocytes: 0 %
Lymphocytes Absolute: 1.1 10*3/uL (ref 0.7–3.1)
Lymphs: 39 %
MCH: 28.5 pg (ref 26.6–33.0)
MCHC: 32.6 g/dL (ref 31.5–35.7)
MCV: 87 fL (ref 79–97)
Monocytes Absolute: 0.2 10*3/uL (ref 0.1–0.9)
Monocytes: 9 %
Neutrophils Absolute: 1.2 10*3/uL — ABNORMAL LOW (ref 1.4–7.0)
Neutrophils: 44 %
Platelets: 136 10*3/uL — ABNORMAL LOW (ref 150–450)
RBC: 4.14 x10E6/uL (ref 3.77–5.28)
RDW: 12.9 % (ref 11.7–15.4)
WBC: 2.7 10*3/uL — ABNORMAL LOW (ref 3.4–10.8)

## 2022-10-30 LAB — VITAMIN B12: Vitamin B-12: 459 pg/mL (ref 232–1245)

## 2022-10-30 LAB — HEPATIC FUNCTION PANEL
ALT: 39 IU/L — ABNORMAL HIGH (ref 0–32)
AST: 34 IU/L (ref 0–40)
Albumin: 4.1 g/dL (ref 3.9–4.9)
Alkaline Phosphatase: 93 IU/L (ref 44–121)
Bilirubin Total: 0.5 mg/dL (ref 0.0–1.2)
Bilirubin, Direct: 0.15 mg/dL (ref 0.00–0.40)
Total Protein: 6.1 g/dL (ref 6.0–8.5)

## 2022-10-30 LAB — BASIC METABOLIC PANEL
BUN/Creatinine Ratio: 19 (ref 9–23)
BUN: 17 mg/dL (ref 6–24)
CO2: 27 mmol/L (ref 20–29)
Calcium: 9.5 mg/dL (ref 8.7–10.2)
Chloride: 105 mmol/L (ref 96–106)
Creatinine, Ser: 0.88 mg/dL (ref 0.57–1.00)
Glucose: 87 mg/dL (ref 70–99)
Potassium: 4 mmol/L (ref 3.5–5.2)
Sodium: 142 mmol/L (ref 134–144)
eGFR: 81 mL/min/{1.73_m2} (ref >59)

## 2022-10-30 LAB — LIPID PANEL
Chol/HDL Ratio: 2.2 ratio (ref 0.0–4.4)
Cholesterol, Total: 168 mg/dL (ref 100–199)
HDL: 76 mg/dL (ref >39)
LDL Chol Calc (NIH): 81 mg/dL (ref 0–99)
Triglycerides: 53 mg/dL (ref 0–149)
VLDL Cholesterol Cal: 11 mg/dL (ref 5–40)

## 2022-10-30 LAB — TSH: TSH: 7.34 u[IU]/mL — ABNORMAL HIGH (ref 0.450–4.500)

## 2022-10-30 LAB — FERRITIN: Ferritin: 44 ng/mL (ref 15–150)

## 2022-10-30 LAB — VITAMIN D 25 HYDROXY (VIT D DEFICIENCY, FRACTURES): Vit D, 25-Hydroxy: 28.7 ng/mL — ABNORMAL LOW (ref 30.0–100.0)

## 2022-11-01 ENCOUNTER — Telehealth: Payer: Self-pay

## 2022-11-01 NOTE — Telephone Encounter (Signed)
-----   Message from Dale Woodward, MD sent at 10/31/2022 10:40 AM EDT ----- Confirm she is doing ok.  Notify - white blood cell count and platelet count - low.  Has varied previously.  One liver test slightly elevated, but stable.  Remainder of liver panel wnl. TSH is elevated.  Confirm taking synthroid q day and taking correctly.  Given TSH elevated, need to increase dose of synthroid.  Increase to q day.  Recheck tsh, cbc and liver panel in 6 weeks.  Vitamin D level decreased.  Confirm if taking vitamin D supplements. If no, start vitamin D3 1000 units per day.  Cholesterol levels look good.  B12 ok. Kidney function wnl.

## 2022-11-02 ENCOUNTER — Telehealth: Payer: Self-pay

## 2022-11-02 DIAGNOSIS — D696 Thrombocytopenia, unspecified: Secondary | ICD-10-CM

## 2022-11-02 DIAGNOSIS — R7989 Other specified abnormal findings of blood chemistry: Secondary | ICD-10-CM

## 2022-11-02 DIAGNOSIS — E039 Hypothyroidism, unspecified: Secondary | ICD-10-CM

## 2022-11-02 NOTE — Telephone Encounter (Signed)
[facility] labs ordered.

## 2022-11-26 ENCOUNTER — Encounter: Payer: Self-pay | Admitting: Internal Medicine

## 2022-11-26 ENCOUNTER — Ambulatory Visit: Payer: Managed Care, Other (non HMO) | Admitting: Internal Medicine

## 2022-11-26 VITALS — BP 108/70 | HR 73 | Temp 97.9°F | Resp 16 | Ht 66.0 in | Wt 137.2 lb

## 2022-11-26 DIAGNOSIS — K580 Irritable bowel syndrome with diarrhea: Secondary | ICD-10-CM

## 2022-11-26 DIAGNOSIS — F341 Dysthymic disorder: Secondary | ICD-10-CM

## 2022-11-26 DIAGNOSIS — K219 Gastro-esophageal reflux disease without esophagitis: Secondary | ICD-10-CM

## 2022-11-26 DIAGNOSIS — R197 Diarrhea, unspecified: Secondary | ICD-10-CM

## 2022-11-26 DIAGNOSIS — D696 Thrombocytopenia, unspecified: Secondary | ICD-10-CM

## 2022-11-26 DIAGNOSIS — D508 Other iron deficiency anemias: Secondary | ICD-10-CM

## 2022-11-26 DIAGNOSIS — I421 Obstructive hypertrophic cardiomyopathy: Secondary | ICD-10-CM

## 2022-11-26 DIAGNOSIS — E039 Hypothyroidism, unspecified: Secondary | ICD-10-CM | POA: Diagnosis not present

## 2022-11-26 DIAGNOSIS — I4729 Other ventricular tachycardia: Secondary | ICD-10-CM

## 2022-11-26 DIAGNOSIS — I10 Essential (primary) hypertension: Secondary | ICD-10-CM

## 2022-11-26 DIAGNOSIS — Z95810 Presence of automatic (implantable) cardiac defibrillator: Secondary | ICD-10-CM

## 2022-11-26 NOTE — Progress Notes (Unsigned)
Subjective:    Patient ID: Carol Mahoney, female    DOB: 1976-03-04, 47 y.o.   MRN: [id number]  Patient here for  Chief Complaint  Patient presents with   Medical Management of Chronic Issues    HPI Here to follow up regarding HOCM, hypertension and increased stress. Discussed. Seeing psychiatry.  Feels from a cardiac standpoint - stable.  Persistent GI issues.  Persistent diarrhea and bowel issues.  Previous CT scan -Prior gastric bypass. The small gastric pouch it is partially herniated into the chest with a suggestion of partially un wrapped fundoplication. Full thickening in the jejunum is nonspecific but could reflect inflammation. There is also some mild smooth wall thickening in the descending and sigmoid colon which could reflect low-grade inflammation. Normal non-contrast appearance of the pancreas common no pseudocyst. Small fluid density structure in the right perirectal space is substantially reduced in size compared to 03/06/2011, and likely benign/incidental. EGD 08/2021 - hiatal hernia, roux-en-Y. Colonoscopy diverticulosis and internal hemorrhoids. Reports history of some dysphagia.  She raised the question of eosinophilic esophagitis.  Wanted f/u with GI in [place].  Waiting for an appt.  Breathing stable.     Past Medical History:  Diagnosis Date   Anemia    Anxiety    Depression    GERD (gastroesophageal reflux disease)    H/O hiatal hernia    Hypertension    Hypertr obst cardiomyop    mitral regurgitation, LAE - followed at [facility]   Hypothyroidism    Migraine headache    PONV (postoperative nausea and vomiting)    Past Surgical History:  Procedure Laterality Date   BUNIONECTOMY     CHOLECYSTECTOMY     COLONOSCOPY WITH PROPOFOL N/A 09/10/2021   Procedure: COLONOSCOPY WITH PROPOFOL;  Surgeon: Midge Minium, MD;  Location: [facility] ENDOSCOPY;  Service: Endoscopy;  Laterality: N/A;   ESOPHAGOGASTRODUODENOSCOPY N/A 09/10/2021   Procedure: ESOPHAGOGASTRODUODENOSCOPY (EGD);   Surgeon: Midge Minium, MD;  Location: [facility] ENDOSCOPY;  Service: Endoscopy;  Laterality: N/A;   HIATAL HERNIA REPAIR     IMPLANTABLE CARDIOVERTER DEFIBRILLATOR REVISION  05/21/2014   [facility]   PARTIAL GASTRECTOMY  2010   TONSILLECTOMY     Family History  Problem Relation Age of Onset   Anesthesia problems Father    Hypertension Mother    Hashimoto's thyroiditis Maternal Grandmother    Hypertension Maternal Grandmother    Melanoma Maternal Grandmother    Breast cancer Neg Hx    Colon cancer Neg Hx    Social History   Socioeconomic History   Marital status: Single    Spouse name: Not on file   Number of children: 1   Years of education: Not on file   Highest education level: Not on file  Occupational History   Occupation: PROGRAM ANALYST    Employer: [employer]  Tobacco Use   Smoking status: Never   Smokeless tobacco: Never  Vaping Use   Vaping Use: Never used  Substance and Sexual Activity   Alcohol use: No    Alcohol/week: 0.0 standard drinks of alcohol   Drug use: No   Sexual activity: Not Currently  Other Topics Concern   Not on file  Social History Narrative   Not on file   Social Determinants of Health   Financial Resource Strain: Not on file  Food Insecurity: Not on file  Transportation Needs: Not on file  Physical Activity: Not on file  Stress: Not on file  Social Connections: Not on file  Review of Systems  Constitutional:  Negative for appetite change and unexpected weight change.  HENT:  Negative for congestion and sinus pressure.   Respiratory:  Negative for cough, chest tightness and shortness of breath.   Cardiovascular:  Negative for chest pain and palpitations.       Noticed some mild swelling recently.  Has not taken fluid pill.  Swelling ok today. Feels may be related to the heat.   Gastrointestinal:  Negative for abdominal pain, diarrhea, nausea and vomiting.  Genitourinary:  Negative for difficulty urinating and dysuria.  Musculoskeletal:   Negative for joint swelling and myalgias.  Skin:  Negative for color change and rash.  Neurological:  Negative for dizziness and headaches.  Psychiatric/Behavioral:  Negative for agitation and dysphoric mood.        Objective:     BP 108/70   Pulse 73   Temp 97.9 F (36.6 C)   Resp 16   Ht 5\' 6"  (1.676 m)   Wt 137 lb 3.2 oz (62.2 kg)   LMP 10/08/2011   SpO2 99%   BMI 22.14 kg/m  Wt Readings from Last 3 Encounters:  11/26/22 137 lb 3.2 oz (62.2 kg)  09/23/22 131 lb 3.2 oz (59.5 kg)  06/27/22 133 lb (60.3 kg)    Physical Exam Vitals reviewed.  Constitutional:      General: She is not in acute distress.    Appearance: Normal appearance.  HENT:     Head: Normocephalic and atraumatic.     Right Ear: External ear normal.     Left Ear: External ear normal.  Eyes:     General: No scleral icterus.       Right eye: No discharge.        Left eye: No discharge.     Conjunctiva/sclera: Conjunctivae normal.  Neck:     Thyroid: No thyromegaly.  Cardiovascular:     Rate and Rhythm: Normal rate and regular rhythm.  Pulmonary:     Effort: No respiratory distress.     Breath sounds: Normal breath sounds. No wheezing.  Abdominal:     General: Bowel sounds are normal.     Palpations: Abdomen is soft.     Tenderness: There is no abdominal tenderness.  Musculoskeletal:        General: No tenderness.     Cervical back: Neck supple. No tenderness.     Comments: No increased swelling.   Lymphadenopathy:     Cervical: No cervical adenopathy.  Skin:    Findings: No erythema or rash.  Neurological:     Mental Status: She is alert.  Psychiatric:        Mood and Affect: Mood normal.        Behavior: Behavior normal.      Outpatient Encounter Medications as of 11/26/2022  Medication Sig   ALPRAZolam (XANAX) 0.5 MG tablet 0.5 mg 3 (three) times daily as needed.    buPROPion (WELLBUTRIN XL) 150 MG 24 hr tablet Take 150 mg by mouth every morning.   Cholecalciferol 25 MCG (1000 UT)  capsule Take 1,000 Units by mouth daily.   clobetasol (TEMOVATE) 0.05 % external solution Apply 1 Application topically 2 (two) times daily. Apply 1-2 gtts to aa scalp bid until clear, then prn flares, avoid face, groin, axilla   clobetasol cream (TEMOVATE) 0.05 % Apply to itchy rash QD-BID PRN. Avoid applying to face, groin, and axilla. Use as directed. Long-term use can cause thinning of the skin.   Clobetasol Propionate 0.05 %  shampoo Massage into dry scalp 3x/wk and let sit 5-10 minutes before rinsing.   diphenoxylate-atropine (LOMOTIL) 2.5-0.025 MG tablet Take 1 tablet by mouth 4 (four) times daily as needed for diarrhea or loose stools.   EPINEPHrine (EPIPEN 2-PAK) 0.3 mg/0.3 mL IJ SOAJ injection USE AS DIRECTED ON PACKAGE   Ivermectin (SOOLANTRA) 1 % CREA Apply to face qhs for rosacea.   levothyroxine (SYNTHROID) 100 MCG tablet TAKE 1 TABLET(100 MCG) BY MOUTH DAILY BEFORE BREAKFAST   Mavacamten (CAMZYOS) 5 MG CAPS Take by mouth.   Mavacamten 5 MG CAPS Take by mouth.   ondansetron (ZOFRAN) 4 MG tablet Take 1 tablet (4 mg total) by mouth 2 (two) times daily as needed for nausea or vomiting.   pantoprazole (PROTONIX) 40 MG tablet TAKE 1 TABLET BY MOUTH DAILY   SKYRIZI 150 MG/ML SOSY INJECT 150MG  SUBCUTANEOUSLY  EVERY 12 WEEKS AS DIRECTED   SUMAtriptan (IMITREX) 50 MG tablet Take 1 tablet by mouth  every 2 hours as needed for migraine(s)   Tapinarof (VTAMA) 1 % CREA Apply 1 application topically daily. Apply to affected areas face, underarms, body once a day for psoriasis.   vitamin B-12 (CYANOCOBALAMIN) 1000 MCG tablet Take 1 tablet (1,000 mcg total) by mouth daily.   [DISCONTINUED] Ciclopirox 1 % shampoo Massage into scalp and let sit several minutes before rinsing. Use QD until improved, then 2-3x/wk for maintenance. (Patient not taking: Reported on 08/18/2022)   No facility-administered encounter medications on file as of 11/26/2022.     Lab Results  Component Value Date   WBC 2.7 (L)  10/29/2022   HGB 11.8 10/29/2022   HCT 36.2 10/29/2022   PLT 136 (L) 10/29/2022   GLUCOSE 87 10/29/2022   CHOL 168 10/29/2022   TRIG 53 10/29/2022   HDL 76 10/29/2022   LDLCALC 81 10/29/2022   ALT 39 (H) 10/29/2022   AST 34 10/29/2022   NA 142 10/29/2022   K 4.0 10/29/2022   CL 105 10/29/2022   CREATININE 0.88 10/29/2022   BUN 17 10/29/2022   CO2 27 10/29/2022   TSH 7.340 (H) 10/29/2022   INR 1.0 09/26/2014   HGBA1C 4.9 12/12/2014    MM 3D SCREEN BREAST BILATERAL  Result Date: 05/13/2022 CLINICAL DATA:  Screening. EXAM: DIGITAL SCREENING BILATERAL MAMMOGRAM WITH TOMOSYNTHESIS AND CAD TECHNIQUE: Bilateral screening digital craniocaudal and mediolateral oblique mammograms were obtained. Bilateral screening digital breast tomosynthesis was performed. The images were evaluated with computer-aided detection. COMPARISON:  Previous exam(s). ACR Breast Density Category c: The breast tissue is heterogeneously dense, which may obscure small masses. FINDINGS: There are no findings suspicious for malignancy. IMPRESSION: No mammographic evidence of malignancy. A result letter of this screening mammogram will be mailed directly to the patient. RECOMMENDATION: Screening mammogram in one year. (Code:SM-B-01Y) BI-RADS CATEGORY  1: Negative. Electronically Signed   By: Edwin Cap M.D.   On: 05/13/2022 10:43       Assessment & Plan:  Hypothyroidism, unspecified type Assessment & Plan: On thyroid replacement.  Follow tsh.   Orders: -     TSH  ANXIETY DEPRESSION Assessment & Plan: Discussed today - increased stress - home/family stress.  Seeing Dr Evelene Croon.  On wellbutrin. Notify me if feels needs any further intervention.    Diarrhea, unspecified type Assessment & Plan: Has previously had GI w/up.  Colonoscopy 08/2021.  Takes lamotil.  Also TUMS.  Has tried adjusting diet, fiber, probiotics, etc.  No real change in symptoms.  Have discussed f/u with GI.  Requested  referral to GI in [place].  Waiting for appt.    Gastroesophageal reflux disease, unspecified whether esophagitis present Assessment & Plan: On protonix.  Takes TUMS prn.  Some dysphagia reported.  Raised the question of eosinophilic esophagitis.  Discussed f/u with GI.  Pt requested referral to GI in [place].     Primary hypertension Assessment & Plan: Blood pressure as outlined.  Overall stable.  On no medication. Continue to monitor.    Hypertrophic obstructive cardiomyopathy (HCC) Assessment & Plan: S/p septal myomectomy.  S/p ICD placement.  Saw Dr Regino Schultze. Stable.    ICD (implantable cardioverter-defibrillator) in place Assessment & Plan: Has been followed by Dr Regino Schultze.    Irritable bowel syndrome with diarrhea Assessment & Plan: Has been followed by symptoms - IBS.  W/up as outlined.  Request referral to GI in [place].     Iron deficiency anemia refractory to iron therapy Assessment & Plan: Has been evaluated by hematology.  Previously received iron infusions.  Follow cbc and iron studies.     NSVT (nonsustained ventricular tachycardia) (HCC) Assessment & Plan: Followed by Dr Regino Schultze.    Thrombocytopenia (HCC) Assessment & Plan: Follow cbc.       Dale Glencoe, MD

## 2022-11-28 ENCOUNTER — Encounter: Payer: Self-pay | Admitting: Internal Medicine

## 2022-11-28 NOTE — Assessment & Plan Note (Signed)
Followed by Dr Wang.  

## 2022-11-28 NOTE — Assessment & Plan Note (Addendum)
Has previously had GI w/up.  Colonoscopy 08/2021.  Takes lamotil.  Also TUMS.  Has tried adjusting diet, fiber, probiotics, etc.  No real change in symptoms.  Have discussed f/u with GI.  Requested referral to GI in [place]. Waiting for appt.

## 2022-11-28 NOTE — Assessment & Plan Note (Signed)
On thyroid replacement.  Follow tsh.  

## 2022-11-28 NOTE — Assessment & Plan Note (Signed)
Blood pressure as outlined.  Overall stable.  On no medication. Continue to monitor.  

## 2022-11-28 NOTE — Assessment & Plan Note (Signed)
Has been followed by Dr Wang.  

## 2022-11-28 NOTE — Assessment & Plan Note (Signed)
Discussed today - increased stress - home/family stress.  Seeing Dr Kaur.  On wellbutrin. Notify me if feels needs any further intervention.  

## 2022-11-28 NOTE — Assessment & Plan Note (Signed)
S/p septal myomectomy.  S/p ICD placement.  Saw Dr Wang. Stable.  

## 2022-11-28 NOTE — Assessment & Plan Note (Signed)
Has been followed by symptoms - IBS.  W/up as outlined.  Request referral to GI in [place].   

## 2022-11-28 NOTE — Assessment & Plan Note (Signed)
On protonix.  Takes TUMS prn.  Some dysphagia reported.  Raised the question of eosinophilic esophagitis.  Discussed f/u with GI.  Pt requested referral to GI in [place].   

## 2022-11-28 NOTE — Assessment & Plan Note (Signed)
Has been evaluated by hematology.  Previously received iron infusions.  Follow cbc and iron studies.   

## 2022-11-28 NOTE — Assessment & Plan Note (Signed)
Follow cbc.  

## 2022-12-27 ENCOUNTER — Other Ambulatory Visit: Payer: Self-pay | Admitting: Internal Medicine

## 2023-01-13 ENCOUNTER — Other Ambulatory Visit: Payer: Self-pay | Admitting: Internal Medicine

## 2023-01-17 MED ORDER — EPINEPHRINE 0.3 MG/0.3ML IJ SOAJ: INTRAMUSCULAR | 0 refills | Status: AC

## 2023-02-07 ENCOUNTER — Ambulatory Visit: Payer: Managed Care, Other (non HMO) | Admitting: Dermatology

## 2023-02-28 ENCOUNTER — Other Ambulatory Visit: Payer: Self-pay | Admitting: Dermatology

## 2023-02-28 ENCOUNTER — Encounter: Payer: Self-pay | Admitting: Internal Medicine

## 2023-02-28 ENCOUNTER — Ambulatory Visit: Payer: Managed Care, Other (non HMO) | Admitting: Internal Medicine

## 2023-02-28 VITALS — BP 100/60 | HR 60 | Temp 97.7°F | Ht 66.0 in | Wt 131.2 lb

## 2023-02-28 DIAGNOSIS — G43119 Migraine with aura, intractable, without status migrainosus: Secondary | ICD-10-CM

## 2023-02-28 DIAGNOSIS — F439 Reaction to severe stress, unspecified: Secondary | ICD-10-CM

## 2023-02-28 DIAGNOSIS — E039 Hypothyroidism, unspecified: Secondary | ICD-10-CM | POA: Diagnosis not present

## 2023-02-28 DIAGNOSIS — Z95810 Presence of automatic (implantable) cardiac defibrillator: Secondary | ICD-10-CM

## 2023-02-28 DIAGNOSIS — E559 Vitamin D deficiency, unspecified: Secondary | ICD-10-CM

## 2023-02-28 DIAGNOSIS — I4729 Other ventricular tachycardia: Secondary | ICD-10-CM

## 2023-02-28 DIAGNOSIS — D508 Other iron deficiency anemias: Secondary | ICD-10-CM

## 2023-02-28 DIAGNOSIS — R197 Diarrhea, unspecified: Secondary | ICD-10-CM

## 2023-02-28 DIAGNOSIS — L900 Lichen sclerosus et atrophicus: Secondary | ICD-10-CM

## 2023-02-28 DIAGNOSIS — K219 Gastro-esophageal reflux disease without esophagitis: Secondary | ICD-10-CM

## 2023-02-28 DIAGNOSIS — L409 Psoriasis, unspecified: Secondary | ICD-10-CM

## 2023-02-28 DIAGNOSIS — D696 Thrombocytopenia, unspecified: Secondary | ICD-10-CM

## 2023-02-28 DIAGNOSIS — Z23 Encounter for immunization: Secondary | ICD-10-CM | POA: Diagnosis not present

## 2023-02-28 DIAGNOSIS — K580 Irritable bowel syndrome with diarrhea: Secondary | ICD-10-CM

## 2023-02-28 DIAGNOSIS — I10 Essential (primary) hypertension: Secondary | ICD-10-CM

## 2023-02-28 DIAGNOSIS — F341 Dysthymic disorder: Secondary | ICD-10-CM

## 2023-02-28 DIAGNOSIS — I421 Obstructive hypertrophic cardiomyopathy: Secondary | ICD-10-CM

## 2023-02-28 NOTE — Progress Notes (Signed)
Subjective:    Patient ID: Carol Mahoney, female    DOB: 12-08-1975, 47 y.o.   MRN: [id number]  Patient here for  Chief Complaint  Patient presents with   Medical Management of Chronic Issues    HPI Here to follow up regarding HOCM, hypertension and increased stress. Sees Dr Evelene Croon for her anxiety/depresion.  On wellbutrin. Persistent GI issues.  Persistent diarrhea and bowel issues.  Previous CT scan -Prior gastric bypass. The small gastric pouch it is partially herniated into the chest with a suggestion of partially un wrapped fundoplication. Full thickening in the jejunum is nonspecific but could reflect inflammation. There is also some mild smooth wall thickening in the descending and sigmoid colon which could reflect low-grade inflammation. Normal non-contrast appearance of the pancreas common no pseudocyst. Small fluid density structure in the right perirectal space is substantially reduced in size compared to 03/06/2011, and likely benign/incidental. EGD 08/2021 - hiatal hernia, roux-en-Y. Colonoscopy diverticulosis and internal hemorrhoids. She is still having bowel issues.  Does request f/u with GI.  Still having swallowing issues.  Trouble swallowing certain foods - hamburger, etc. Had f/u Dr Sherryll Burger 12/08/22 - f/u migraine.  Recommended continuing imitrex prn.  Had f/u ECHO 12/22/22 - compared to previous - no significant change. Had f/u with cardiology 02/23/23 - f/u implantable cardioverter defibrillator. Per review, has not had any sustained VT episodes detected or treated by the ICD. She has had 3 episodes of NSVT since January of 2024; the longest episode was nine second long and occurred on May 29, 2022. Felt stable.  No changes made. Continue remote monitoring. Breathing overall stable. Increased stress.  Discussed.     Past Medical History:  Diagnosis Date   Anemia    Anxiety    Depression    GERD (gastroesophageal reflux disease)    H/O hiatal hernia    Hypertension    Hypertr  obst cardiomyop    mitral regurgitation, LAE - followed at [facility]   Hypothyroidism    Migraine headache    PONV (postoperative nausea and vomiting)    Past Surgical History:  Procedure Laterality Date   BUNIONECTOMY     CHOLECYSTECTOMY     COLONOSCOPY WITH PROPOFOL N/A 09/10/2021   Procedure: COLONOSCOPY WITH PROPOFOL;  Surgeon: Midge Minium, MD;  Location: [facility] ENDOSCOPY;  Service: Endoscopy;  Laterality: N/A;   ESOPHAGOGASTRODUODENOSCOPY N/A 09/10/2021   Procedure: ESOPHAGOGASTRODUODENOSCOPY (EGD);  Surgeon: Midge Minium, MD;  Location[facility] ENDOSCOPY;  Service: Endoscopy;  Laterality: N/A;   HIATAL HERNIA REPAIR     IMPLANTABLE CARDIOVERTER DEFIBRILLATOR REVISION  05/21/2014   [facility]   PARTIAL GASTRECTOMY  2010   TONSILLECTOMY     Family History  Problem Relation Age of Onset   Anesthesia problems Father    Hypertension Mother    Hashimoto's thyroiditis Maternal Grandmother    Hypertension Maternal Grandmother    Melanoma Maternal Grandmother    Breast cancer Neg Hx    Colon cancer Neg Hx    Social History   Socioeconomic History   Marital status: Single    Spouse name: Not on file   Number of children: 1   Years of education: Not on file   Highest education level: Not on file  Occupational History   Occupation: PROGRAM ANALYST    Employer: [employer]  Tobacco Use   Smoking status: Never   Smokeless tobacco: Never  Vaping Use   Vaping status: Never Used  Substance and Sexual Activity   Alcohol use: No  Alcohol/week: 0.0 standard drinks of alcohol   Drug use: No   Sexual activity: Not Currently  Other Topics Concern   Not on file  Social History Narrative   Not on file   Social Determinants of Health   Financial Resource Strain: Low Risk  (10/25/2018)   Received from [facility], [facility]   Overall Financial Resource Strain (CARDIA)    Difficulty of Paying Living Expenses: Not hard at all  Food Insecurity: No Food  Insecurity (10/25/2018)   Received from [facility], [facility]   Hunger Vital Sign    Worried About Running Out of Food in the Last Year: Never true    Ran Out of Food in the Last Year: Never true  Transportation Needs: No Transportation Needs (10/25/2018)   Received from [facility], [facility]   [facility] - Transportation    In the past 12 months, has lack of transportation kept you from medical appointments or from getting medications?: No    Lack of Transportation (Non-Medical): No  Physical Activity: Insufficiently Active (10/25/2018)   Received from [facility], [facility]   Exercise Vital Sign    Days of Exercise per Week: 2 days    Minutes of Exercise per Session: 10 min  Stress: Not on file  Social Connections: Not on file     Review of Systems  Constitutional:  Negative for appetite change and unexpected weight change.  HENT:  Negative for congestion and sinus pressure.   Respiratory:  Negative for cough and chest tightness.        No increased sob.   Cardiovascular:  Negative for chest pain and palpitations.  Gastrointestinal:  Negative for vomiting.       Problems with swallowing as outlined.  Persistent bowel issues.   Genitourinary:  Negative for difficulty urinating and dysuria.  Musculoskeletal:  Negative for joint swelling and myalgias.  Skin:  Negative for color change and rash.  Neurological:  Negative for dizziness and headaches.  Psychiatric/Behavioral:         Increased stress as outlined.        Objective:     BP 100/60   Pulse 60   Temp 97.7 F (36.5 C) (Oral)   Ht 5\' 6"  (1.676 m)   Wt 131 lb 3.2 oz (59.5 kg)   LMP 10/08/2011   SpO2 97%   BMI 21.18 kg/m  Wt Readings from Last 3 Encounters:  02/28/23 131 lb 3.2 oz (59.5 kg)  11/26/22 137 lb 3.2 oz (62.2 kg)  09/23/22 131 lb 3.2 oz (59.5 kg)    Physical Exam Vitals reviewed.   Constitutional:      General: She is not in acute distress.    Appearance: Normal appearance.  HENT:     Head: Normocephalic and atraumatic.     Right Ear: External ear normal.     Left Ear: External ear normal.  Eyes:     General: No scleral icterus.       Right eye: No discharge.        Left eye: No discharge.     Conjunctiva/sclera: Conjunctivae normal.  Neck:     Thyroid: No thyromegaly.  Cardiovascular:     Rate and Rhythm: Normal rate and regular rhythm.  Pulmonary:     Effort: No respiratory distress.     Breath sounds: Normal breath sounds. No wheezing.  Abdominal:  General: Bowel sounds are normal.     Palpations: Abdomen is soft.     Tenderness: There is no abdominal tenderness.  Musculoskeletal:        General: No swelling or tenderness.     Cervical back: Neck supple. No tenderness.  Lymphadenopathy:     Cervical: No cervical adenopathy.  Skin:    Findings: No erythema or rash.  Neurological:     Mental Status: She is alert.  Psychiatric:        Mood and Affect: Mood normal.        Behavior: Behavior normal.      Outpatient Encounter Medications as of 02/28/2023  Medication Sig   ALPRAZolam (XANAX) 0.5 MG tablet 0.5 mg 3 (three) times daily as needed.    buPROPion (WELLBUTRIN XL) 150 MG 24 hr tablet Take 150 mg by mouth every morning.   Cholecalciferol 25 MCG (1000 UT) capsule Take 1,000 Units by mouth daily.   clobetasol (TEMOVATE) 0.05 % external solution Apply 1 Application topically 2 (two) times daily. Apply 1-2 gtts to aa scalp bid until clear, then prn flares, avoid face, groin, axilla   clobetasol cream (TEMOVATE) 0.05 % Apply to itchy rash QD-BID PRN. Avoid applying to face, groin, and axilla. Use as directed. Long-term use can cause thinning of the skin.   Clobetasol Propionate 0.05 % shampoo Massage into dry scalp 3x/wk and let sit 5-10 minutes before rinsing.   diphenoxylate-atropine (LOMOTIL) 2.5-0.025 MG tablet Take 1 tablet by mouth 4  (four) times daily as needed for diarrhea or loose stools.   EPINEPHrine (EPIPEN 2-PAK) 0.3 mg/0.3 mL IJ SOAJ injection USE AS DIRECTED ON PACKAGE   Ivermectin (SOOLANTRA) 1 % CREA Apply to face qhs for rosacea.   levothyroxine (SYNTHROID) 100 MCG tablet TAKE 1 TABLET(100 MCG) BY MOUTH DAILY BEFORE BREAKFAST   Mavacamten (CAMZYOS) 5 MG CAPS Take by mouth.   Mavacamten 5 MG CAPS Take by mouth.   ondansetron (ZOFRAN) 4 MG tablet Take 1 tablet (4 mg total) by mouth 2 (two) times daily as needed for nausea or vomiting.   pantoprazole (PROTONIX) 40 MG tablet TAKE 1 TABLET BY MOUTH DAILY   SKYRIZI 150 MG/ML SOSY prefilled syringe INJECT 150MG  SUBCUTANEOUSLY  EVERY 12 WEEKS   SUMAtriptan (IMITREX) 50 MG tablet Take 1 tablet by mouth  every 2 hours as needed for migraine(s)   Tapinarof (VTAMA) 1 % CREA Apply 1 application topically daily. Apply to affected areas face, underarms, body once a day for psoriasis.   vitamin B-12 (CYANOCOBALAMIN) 1000 MCG tablet Take 1 tablet (1,000 mcg total) by mouth daily.   [DISCONTINUED] SKYRIZI 150 MG/ML SOSY INJECT 150MG  SUBCUTANEOUSLY  EVERY 12 WEEKS AS DIRECTED   No facility-administered encounter medications on file as of 02/28/2023.     Lab Results  Component Value Date   WBC 2.7 (L) 10/29/2022   HGB 11.8 10/29/2022   HCT 36.2 10/29/2022   PLT 136 (L) 10/29/2022   GLUCOSE 87 10/29/2022   CHOL 168 10/29/2022   TRIG 53 10/29/2022   HDL 76 10/29/2022   LDLCALC 81 10/29/2022   ALT 39 (H) 10/29/2022   AST 34 10/29/2022   NA 142 10/29/2022   K 4.0 10/29/2022   CL 105 10/29/2022   CREATININE 0.88 10/29/2022   BUN 17 10/29/2022   CO2 27 10/29/2022   TSH 1.390 12/20/2022   INR 1.0 09/26/2014   HGBA1C 4.9 12/12/2014    MM 3D SCREEN BREAST BILATERAL  Result Date: 05/13/2022 CLINICAL  DATA:  Screening. EXAM: DIGITAL SCREENING BILATERAL MAMMOGRAM WITH TOMOSYNTHESIS AND CAD TECHNIQUE: Bilateral screening digital craniocaudal and mediolateral oblique  mammograms were obtained. Bilateral screening digital breast tomosynthesis was performed. The images were evaluated with computer-aided detection. COMPARISON:  Previous exam(s). ACR Breast Density Category c: The breast tissue is heterogeneously dense, which may obscure small masses. FINDINGS: There are no findings suspicious for malignancy. IMPRESSION: No mammographic evidence of malignancy. A result letter of this screening mammogram will be mailed directly to the patient. RECOMMENDATION: Screening mammogram in one year. (Code:SM-B-01Y) BI-RADS CATEGORY  1: Negative. Electronically Signed   By: Edwin Cap M.D.   On: 05/13/2022 10:43       Assessment & Plan:  Stress Assessment & Plan: Increased stress.  Discussed.  Seeing psychiatry.  Continue to follow up with her.  Continues on wellbutrin. Follow.    Vitamin D deficiency  NSVT (nonsustained ventricular tachycardia) (HCC) Assessment & Plan: Followed by Dr Regino Schultze. Stable.    Iron deficiency anemia refractory to iron therapy Assessment & Plan: Has been evaluated by hematology.  Previously received iron infusions.  Follow cbc and iron studies.    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  Has imitrex to take prn.    ICD (implantable cardioverter-defibrillator) in place Assessment & Plan: Has been followed by Dr Regino Schultze.    Hypothyroidism, unspecified type Assessment & Plan: On thyroid replacement.  Follow tsh.   Orders: -     TSH  Hypertrophic obstructive cardiomyopathy (HCC) Assessment & Plan: S/p septal myomectomy.  S/p ICD placement.  Followed by cardiology/Dr Regino Schultze. Stable.   Orders: -     Basic metabolic panel -     Lipid panel -     Hepatic function panel  Primary hypertension Assessment & Plan: Blood pressure as outlined.  Overall stable.  On no medication. Continue to monitor.    Encounter for  immunization -     Flu vaccine trivalent PF, 6mos and older(Flulaval,Afluria,Fluarix,Fluzone)  Thrombocytopenia (HCC) Assessment & Plan: Follow cbc.    ANXIETY DEPRESSION Assessment & Plan: Discussed today - increased stress - home/family stress.  Seeing Dr Evelene Croon.  On wellbutrin. Notify me if feels needs any further intervention.    Diarrhea, unspecified type Assessment & Plan: Has previously had GI w/up.  Colonoscopy 08/2021.  Takes lamotil.  Also TUMS.  Has tried adjusting diet, fiber, probiotics, etc.  No real change in symptoms.  Have discussed f/u with GI.  Requested f/u with GI.    Gastroesophageal reflux disease, unspecified whether esophagitis present Assessment & Plan: On protonix.  Takes TUMS prn.  Some dysphagia reported.  She raised the question of eosinophilic esophagitis.  Pt requested f/u with GI to discuss.    Irritable bowel syndrome with diarrhea Assessment & Plan: Has been followed by GI - IBS.  W/up as outlined.  Request referral back to GI.     Lichen sclerosus Assessment & Plan: Has seen gyn.  Has used clobetasol.  Confirm continued f/u with gyn.       Dale Fisher, MD

## 2023-03-06 ENCOUNTER — Encounter: Payer: Self-pay | Admitting: Internal Medicine

## 2023-03-06 NOTE — Assessment & Plan Note (Signed)
Discussed today - increased stress - home/family stress.  Seeing Dr Evelene Croon.  On wellbutrin. Notify me if feels needs any further intervention.

## 2023-03-06 NOTE — Assessment & Plan Note (Signed)
Follow cbc.  

## 2023-03-06 NOTE — Assessment & Plan Note (Signed)
Blood pressure as outlined.  Overall stable.  On no medication. Continue to monitor.

## 2023-03-06 NOTE — Assessment & Plan Note (Signed)
Has been followed by Dr Regino Schultze.

## 2023-03-06 NOTE — Assessment & Plan Note (Addendum)
S/p septal myomectomy.  S/p ICD placement.  Followed by cardiology/Dr Regino Schultze. Stable.

## 2023-03-06 NOTE — Assessment & Plan Note (Signed)
On thyroid replacement.  Follow tsh.  

## 2023-03-06 NOTE — Assessment & Plan Note (Signed)
Increased stress.  Discussed.  Seeing psychiatry.  Continue to follow up with her.  Continues on wellbutrin. Follow.

## 2023-03-06 NOTE — Assessment & Plan Note (Signed)
On protonix.  Takes TUMS prn.  Some dysphagia reported.  She raised the question of eosinophilic esophagitis.  Pt requested f/u with GI to discuss.

## 2023-03-06 NOTE — Assessment & Plan Note (Signed)
Has been followed by GI - IBS.  W/up as outlined.  Request referral back to GI.

## 2023-03-06 NOTE — Assessment & Plan Note (Signed)
Has seen gyn.  Has used clobetasol.  Confirm continued f/u with gyn.

## 2023-03-06 NOTE — Assessment & Plan Note (Signed)
Has been evaluated by hematology.  Previously received iron infusions.  Follow cbc and iron studies.

## 2023-03-06 NOTE — Assessment & Plan Note (Signed)
Seeing neurology.  Has imitrex to take prn.

## 2023-03-06 NOTE — Assessment & Plan Note (Signed)
Followed by Dr Regino Schultze. Stable.

## 2023-03-06 NOTE — Assessment & Plan Note (Signed)
Has previously had GI w/up.  Colonoscopy 08/2021.  Takes lamotil.  Also TUMS.  Has tried adjusting diet, fiber, probiotics, etc.  No real change in symptoms.  Have discussed f/u with GI.  Requested f/u with GI.

## 2023-05-16 ENCOUNTER — Other Ambulatory Visit: Payer: Self-pay | Admitting: Internal Medicine

## 2023-05-27 ENCOUNTER — Ambulatory Visit: Payer: Self-pay | Admitting: Internal Medicine

## 2023-05-27 NOTE — Telephone Encounter (Signed)
  Chief Complaint: Ongoing possible UTI Symptoms: flank pain, mild urgency Frequency: Began 12/29, seen with virtual MD Pertinent Negatives: Patient denies fever, dysuria Disposition: [] ED /[] Urgent Care (no appt availability in office) / [] Appointment(In office/virtual)/ []  [place] [facility]/ [] Home Care/ [] Refused Recommended Disposition /[] [place] [facility]/ [x]  Follow-up with PCP Additional Notes: Pt reports she was seen via virtual visit with [facility] through her employer for possible UTI, no urine sample was tested. She was experiencing back pain and urgency at that time that has since resolved. She was prescribed Macrobid 100 mg BID, she reports she took one the evening of 12/29, 2 tabs on 12/30 and then has only been taking one daily since then. She reports she is taking them with food but she is experiencing nausea, fatigue and feels blah. She reports that she does have IBS and her gut may be irritated by the antibiotics but she has noticed some new onset mild flank pain and continues with very mild urgency. Pt requesting a prescription for Zofran  be sent to her pharmacy at the [facility] in [place] [address]. She would also like to know if she should finish the last 3 tablets of the Macrobid or if another antibiotic should be prescribed. This RN educated pt to call for new or worsening symptoms or concerns. Pt verbalized understanding and agrees to plan. Will forward to provider for review.   Copied from CRM [id number]. Topic: Clinical - Red Word Triage >> May 27, 2023 12:48 PM Robinson DEL wrote: Red Word that prompted transfer to Nurse Triage: "Sunday md live visit for uti prescribed Microbid medication, but making her sick so she's not taking as directed. Pain in side, nausea Reason for Disposition . [1] Reasonable improvement on antibiotics AND [2] no fever  Answer Assessment - Initial Assessment Questions 1. MAIN SYMPTOM: What is the main symptom you are concerned about? (e.g.,  painful urination, urine frequency)     Flank pain, nausea 2. BETTER-SAME-WORSE: Are you getting better, staying the same, or getting worse compared to how you felt at your last visit to the doctor (most recent medical visit)?     Improving 3. PAIN: How bad is the pain?  (e.g., Scale 1-10; mild, moderate, or severe)   - MILD (1-3): complains slightly about urination hurting   - MODERATE (4-7): interferes with normal activities     - SEVERE (8-10): excruciating, unwilling or unable to urinate because of the pain      1/10 4. FEVER: Do you have a fever? If Yes, ask: What is it, how was it measured, and when did it start?     No 5. OTHER SYMPTOMS: Do you have any other symptoms? (e.g., blood in the urine, flank pain, vaginal discharge)     Original symptoms urgency and back pain have resolved 6. DIAGNOSIS: When was the UTI diagnosed? By whom? Was it a kidney infection, bladder infection or both?     MD live appt through employer, virtual 7. ANTIBIOTIC: What antibiotic(s) are you taking? How many times per day?     Macrobid, once daily (ordered twice daily). Patient reports she is experiencing nausea, fatigue, and abd discomfort, she is taking the doses with food. 8. ANTIBIOTIC - START DATE: When did you start taking the antibiotic?     12" /29, only took two on day 2  Protocols used: Urinary Tract Infection on Antibiotic Follow-up Call - [facility]

## 2023-05-27 NOTE — Telephone Encounter (Signed)
 Called patient, scheduled her for appt on Monday and advised urgent care if symptoms worsen over the weekend. She is going to stop the macrobid since she is not taking as directed.

## 2023-05-30 ENCOUNTER — Telehealth: Payer: Self-pay

## 2023-05-30 ENCOUNTER — Ambulatory Visit: Payer: Managed Care, Other (non HMO) | Admitting: Family

## 2023-05-30 NOTE — Telephone Encounter (Signed)
 Pt declined sooner appt and would like to wait until 1/9 to be seen. She says she is doing ok- symptoms better but wanted to keep appt to confirm.

## 2023-05-30 NOTE — Telephone Encounter (Signed)
 LM for patient, she was supposed to see Claris Che for UTI today. Discussed with Dr Lorin Picket. She is ok with patient going to [facility] tomorrow morning and add on at 4:30 for virtual tomorrow at 4:30 to discuss if patient is available/

## 2023-05-30 NOTE — Telephone Encounter (Signed)
 Copied from CRM [id number]. Topic: General - Other >> May 30, 2023  1:11 PM Corin V wrote: Reason for CRM: Patient returning Trisha's call. Please call patient back.

## 2023-06-02 ENCOUNTER — Ambulatory Visit: Payer: Managed Care, Other (non HMO) | Admitting: Family

## 2023-06-02 ENCOUNTER — Encounter: Payer: Self-pay | Admitting: Family

## 2023-06-02 VITALS — BP 110/70 | HR 78 | Temp 97.2°F | Ht 66.0 in | Wt 138.6 lb

## 2023-06-02 DIAGNOSIS — R399 Unspecified symptoms and signs involving the genitourinary system: Secondary | ICD-10-CM

## 2023-06-02 DIAGNOSIS — R3915 Urgency of urination: Secondary | ICD-10-CM | POA: Insufficient documentation

## 2023-06-02 LAB — POCT URINALYSIS DIPSTICK
Bilirubin, UA: NEGATIVE
Blood, UA: NEGATIVE
Glucose, UA: NEGATIVE
Ketones, UA: NEGATIVE
Nitrite, UA: NEGATIVE
Protein, UA: NEGATIVE
Spec Grav, UA: 1.02 (ref 1.010–1.025)
Urobilinogen, UA: 0.2 U/dL
pH, UA: 5.5 (ref 5.0–8.0)

## 2023-06-02 LAB — URINALYSIS, ROUTINE W REFLEX MICROSCOPIC
Bilirubin Urine: NEGATIVE
Hgb urine dipstick: NEGATIVE
Ketones, ur: NEGATIVE
Nitrite: NEGATIVE
Specific Gravity, Urine: 1.02 (ref 1.000–1.030)
Total Protein, Urine: NEGATIVE
Urine Glucose: NEGATIVE
Urobilinogen, UA: 0.2 (ref 0.0–1.0)
pH: 6 (ref 5.0–8.0)

## 2023-06-02 MED ORDER — ONDANSETRON HCL 4 MG PO TABS: 4.0000 mg | ORAL_TABLET | Freq: Two times a day (BID) | ORAL | 0 refills | Status: AC | PRN

## 2023-06-02 NOTE — Patient Instructions (Signed)
 If we needed to use ciprofloxacin, would send him a mychart message in regards to if safe to take .  The other, less frequently used is one dose of monurol.   Would consider macrobid once daily dosing if in a pinch.

## 2023-06-02 NOTE — Progress Notes (Signed)
 Assessment & Plan:  UTI symptoms -     POCT urinalysis dipstick -     Urine Culture -     Urinalysis, Routine w reflex microscopic  Urinary urgency Assessment & Plan: Nontoxic in appearance, afebrile.  Reassuring exam   POC ruine with small leucyctes UA, negative nitrites Urgency symptoms have largely resolved.  Discussed awaiting urinalysis and urine culture for confirmation.  We also discussed at length antibiotic allergies and use of mavacamten.  If we were to use an antibiotic, we would consider macrobid once daily ( d/t GI upset), one time dose of monurol,or ciprofloxacin.  Discussed hesitation with ciprofloxacin due to blackbox warning, irreversible tendon rupture Encouraged plenty of water. Zofran  refilled.    Other orders -     Ondansetron  HCl; Take 1 tablet (4 mg total) by mouth 2 (two) times daily as needed for nausea or vomiting.  Dispense: 20 tablet; Refill: 0     Return precautions given.   Risks, benefits, and alternatives of the medications and treatment plan prescribed today were discussed, and patient expressed understanding.   Education regarding symptom management and diagnosis given to patient on AVS either electronically or printed.  No follow-ups on file.  Rollene Northern, FNP  Subjective:    Patient ID: Carol Mahoney, female    DOB: [birth year], 48 y.o.   MRN: [id number]  CC: Carol Mahoney is a 48 y.o. female who presents today for an acute visit.    HPI: Here today for urine to be rechecked after virtual visit   Some urinary urgency today, improved from prior.   Denies fever, N, flank pain, dysuria.    She used MDLive due to urinary urgency , low back pain x 2 weeks ( 05/22/23)  She had taken Azo     She reports started macrobid for dysuria, since stopped due to intolerance of nausea, GI upset.  She spoke with cardiologist prior to starting Macrobid as she is taking mavacamten in regards to potential drug interaction; per patient macrobid was  okay to take.     No h/o recurrent UTI.    Follows with EP  for HOCM, defibrillator;  compliant with mavacamten.   History of migraine, GERD, hypothyroidism  She requests zofran  refill for periodic use. She usually keeps on 'hand'.   H/o IBS- diarrhea  Of note, she is able to take azithromycin  without side effects.  She takes this ahead of dental procedures  Allergies: Carafate [sucralfate], Chloraprep one step [chlorhexidine gluconate], Shellfish-derived products, Vancomycin, Penicillins, Sulfa antibiotics, Doxycycline , Soap, Trimethoprim, Betadine [povidone iodine], and Iodine Current Outpatient Medications on File Prior to Visit  Medication Sig Dispense Refill   ALPRAZolam  (XANAX ) 0.5 MG tablet 0.5 mg 3 (three) times daily as needed.      buPROPion (WELLBUTRIN XL) 150 MG 24 hr tablet Take 150 mg by mouth every morning.     Cholecalciferol  25 MCG (1000 UT) capsule Take 1,000 Units by mouth daily.     clobetasol  (TEMOVATE ) 0.05 % external solution Apply 1 Application topically 2 (two) times daily. Apply 1-2 gtts to aa scalp bid until clear, then prn flares, avoid face, groin, axilla 50 mL 1   clobetasol  cream (TEMOVATE ) 0.05 % Apply to itchy rash QD-BID PRN. Avoid applying to face, groin, and axilla. Use as directed. Long-term use can cause thinning of the skin. 30 g 0   Clobetasol  Propionate 0.05 % shampoo Massage into dry scalp 3x/wk and let sit 5-10 minutes before rinsing. 118 mL 3  diphenoxylate -atropine  (LOMOTIL ) 2.5-0.025 MG tablet Take 1 tablet by mouth 4 (four) times daily as needed for diarrhea or loose stools. 120 tablet 5   EPINEPHrine  (EPIPEN  2-PAK) 0.3 mg/0.3 mL IJ SOAJ injection USE AS DIRECTED ON PACKAGE 2 each 0   Ivermectin  (SOOLANTRA ) 1 % CREA Apply to face qhs for rosacea. 45 g 5   levothyroxine  (SYNTHROID ) 100 MCG tablet TAKE 1 TABLET(100 MCG) BY MOUTH DAILY BEFORE BREAKFAST 90 tablet 0   Mavacamten (CAMZYOS) 5 MG CAPS Take by mouth.     Mavacamten 5 MG CAPS Take  by mouth.     pantoprazole  (PROTONIX ) 40 MG tablet TAKE 1 TABLET BY MOUTH DAILY 90 tablet 3   SKYRIZI  150 MG/ML SOSY prefilled syringe INJECT 150MG  SUBCUTANEOUSLY  EVERY 12 WEEKS 1 mL 0   SUMAtriptan  (IMITREX ) 50 MG tablet Take 1 tablet by mouth  every 2 hours as needed for migraine(s) 54 tablet 2   Tapinarof  (VTAMA ) 1 % CREA Apply 1 application topically daily. Apply to affected areas face, underarms, body once a day for psoriasis. 60 g 3   vitamin B-12 (CYANOCOBALAMIN ) 1000 MCG tablet Take 1 tablet (1,000 mcg total) by mouth daily. 30 tablet 3   No current facility-administered medications on file prior to visit.    Review of Systems  Constitutional:  Negative for chills and fever.  Respiratory:  Negative for cough.   Cardiovascular:  Negative for chest pain and palpitations.  Gastrointestinal:  Negative for abdominal pain, nausea and vomiting.  Genitourinary:  Positive for urgency. Negative for difficulty urinating and dysuria.      Objective:    BP 110/70   Pulse 78   Temp (!) 97.2 F (36.2 C) (Oral)   Ht 5' 6 (1.676 m)   Wt 138 lb 9.6 oz (62.9 kg)   LMP 10/08/2011   SpO2 96%   BMI 22.37 kg/m   BP Readings from Last 3 Encounters:  06/02/23 110/70  02/28/23 100/60  11/26/22 108/70   Wt Readings from Last 3 Encounters:  06/02/23 138 lb 9.6 oz (62.9 kg)  02/28/23 131 lb 3.2 oz (59.5 kg)  11/26/22 137 lb 3.2 oz (62.2 kg)    Physical Exam Vitals reviewed.  Constitutional:      Appearance: Normal appearance. She is well-developed.  Eyes:     Conjunctiva/sclera: Conjunctivae normal.  Cardiovascular:     Rate and Rhythm: Normal rate and regular rhythm.     Pulses: Normal pulses.     Heart sounds: Normal heart sounds.  Pulmonary:     Effort: Pulmonary effort is normal.     Breath sounds: Normal breath sounds. No wheezing, rhonchi or rales.  Abdominal:     General: Bowel sounds are normal. There is no distension.     Palpations: Abdomen is soft. Abdomen is not  rigid. There is no fluid wave or mass.     Tenderness: There is no abdominal tenderness. There is no guarding or rebound.  Skin:    General: Skin is warm and dry.  Neurological:     Mental Status: She is alert.  Psychiatric:        Speech: Speech normal.        Behavior: Behavior normal.        Thought Content: Thought content normal.

## 2023-06-02 NOTE — Assessment & Plan Note (Addendum)
 Nontoxic in appearance, afebrile.  Reassuring exam   POC ruine with small leucyctes UA, negative nitrites Urgency symptoms have largely resolved.  Discussed awaiting urinalysis and urine culture for confirmation.  We also discussed at length antibiotic allergies and use of mavacamten.  If we were to use an antibiotic, we would consider macrobid once daily ( d/t GI upset), one time dose of monurol,or ciprofloxacin.  Discussed hesitation with ciprofloxacin due to blackbox warning, irreversible tendon rupture Encouraged plenty of water. Zofran  refilled.

## 2023-06-03 ENCOUNTER — Encounter: Payer: Self-pay | Admitting: Family

## 2023-06-04 LAB — URINE CULTURE
MICRO NUMBER:: 15938183
Result:: NO GROWTH
SPECIMEN QUALITY:: ADEQUATE

## 2023-06-06 ENCOUNTER — Other Ambulatory Visit: Payer: Self-pay | Admitting: Dermatology

## 2023-06-06 DIAGNOSIS — L409 Psoriasis, unspecified: Secondary | ICD-10-CM

## 2023-06-08 ENCOUNTER — Encounter: Payer: Managed Care, Other (non HMO) | Admitting: Internal Medicine

## 2023-06-14 ENCOUNTER — Encounter: Payer: Managed Care, Other (non HMO) | Admitting: Internal Medicine

## 2023-06-17 ENCOUNTER — Ambulatory Visit (INDEPENDENT_AMBULATORY_CARE_PROVIDER_SITE_OTHER): Payer: Managed Care, Other (non HMO) | Admitting: Internal Medicine

## 2023-06-17 VITALS — BP 110/68 | HR 78 | Temp 98.0°F | Resp 16 | Ht 66.0 in | Wt 137.2 lb

## 2023-06-17 DIAGNOSIS — R197 Diarrhea, unspecified: Secondary | ICD-10-CM

## 2023-06-17 DIAGNOSIS — D696 Thrombocytopenia, unspecified: Secondary | ICD-10-CM

## 2023-06-17 DIAGNOSIS — I10 Essential (primary) hypertension: Secondary | ICD-10-CM

## 2023-06-17 DIAGNOSIS — Z0000 Encounter for general adult medical examination without abnormal findings: Secondary | ICD-10-CM

## 2023-06-17 DIAGNOSIS — K580 Irritable bowel syndrome with diarrhea: Secondary | ICD-10-CM

## 2023-06-17 DIAGNOSIS — I421 Obstructive hypertrophic cardiomyopathy: Secondary | ICD-10-CM

## 2023-06-17 DIAGNOSIS — I4729 Other ventricular tachycardia: Secondary | ICD-10-CM

## 2023-06-17 DIAGNOSIS — E039 Hypothyroidism, unspecified: Secondary | ICD-10-CM

## 2023-06-17 DIAGNOSIS — Z1231 Encounter for screening mammogram for malignant neoplasm of breast: Secondary | ICD-10-CM | POA: Diagnosis not present

## 2023-06-17 DIAGNOSIS — G43119 Migraine with aura, intractable, without status migrainosus: Secondary | ICD-10-CM

## 2023-06-17 DIAGNOSIS — F341 Dysthymic disorder: Secondary | ICD-10-CM | POA: Diagnosis not present

## 2023-06-17 DIAGNOSIS — K219 Gastro-esophageal reflux disease without esophagitis: Secondary | ICD-10-CM

## 2023-06-17 DIAGNOSIS — D508 Other iron deficiency anemias: Secondary | ICD-10-CM

## 2023-06-17 DIAGNOSIS — F439 Reaction to severe stress, unspecified: Secondary | ICD-10-CM

## 2023-06-17 NOTE — Assessment & Plan Note (Signed)
Mammogram 05/12/22 - Birads I.  Colonoscopy 09/10/21 - diverticulosis and internal hemorrhoids.   Recommended f/u in 10 years.  Followed by gyn. Physical today 06/17/23.

## 2023-06-17 NOTE — Progress Notes (Unsigned)
Subjective:    Patient ID: Carol Mahoney, female    DOB: [birth year], 48 y.o.   MRN: [id number]  Patient here for No chief complaint on file.   HPI Here for a physical exam. Had f/u ECHO 06/08/23 -calculated EF 50% with indeterminate diastolic function, trivial AR, mild MR, trivial PR and mild TR. Sees Dr Evelene Croon for her anxiety/depresion. On wellbutrin. Had f/u Dr Sherryll Burger 12/08/22 - f/u migraine. Recommended continuing imitrex prn.    Past Medical History:  Diagnosis Date   Anemia    Anxiety    Depression    GERD (gastroesophageal reflux disease)    H/O hiatal hernia    Hypertension    Hypertr obst cardiomyop    mitral regurgitation, LAE - followed at [facility]   Hypothyroidism    Migraine headache    PONV (postoperative nausea and vomiting)    Past Surgical History:  Procedure Laterality Date   BUNIONECTOMY     CHOLECYSTECTOMY     COLONOSCOPY WITH PROPOFOL N/A 09/10/2021   Procedure: COLONOSCOPY WITH PROPOFOL;  Surgeon: Midge Minium, MD;  Location: [facility] ENDOSCOPY;  Service: Endoscopy;  Laterality: N/A;   ESOPHAGOGASTRODUODENOSCOPY N/A 09/10/2021   Procedure: ESOPHAGOGASTRODUODENOSCOPY (EGD);  Surgeon: Midge Minium, MD;  Location: [facility] ENDOSCOPY;  Service: Endoscopy;  Laterality: N/A;   HIATAL HERNIA REPAIR     IMPLANTABLE CARDIOVERTER DEFIBRILLATOR REVISION  05/21/2014   [facility]   PARTIAL GASTRECTOMY  2010   TONSILLECTOMY     Family History  Problem Relation Age of Onset   Anesthesia problems Father    Hypertension Mother    Hashimoto's thyroiditis Maternal Grandmother    Hypertension Maternal Grandmother    Melanoma Maternal Grandmother    Breast cancer Neg Hx    Colon cancer Neg Hx    Social History   Socioeconomic History   Marital status: Single    Spouse name: Not on file   Number of children: 1   Years of education: Not on file   Highest education level: Not on file  Occupational History   Occupation: PROGRAM ANALYST    Employer: [employer]  Tobacco Use   Smoking  status: Never   Smokeless tobacco: Never  Vaping Use   Vaping status: Never Used  Substance and Sexual Activity   Alcohol use: No    Alcohol/week: 0.0 standard drinks of alcohol   Drug use: No   Sexual activity: Not Currently  Other Topics Concern   Not on file  Social History Narrative   Not on file   Social Drivers of Health   Financial Resource Strain: Low Risk  (10/25/2018)   Received from [facility], [facility]   Overall Financial Resource Strain (CARDIA)    Difficulty of Paying Living Expenses: Not hard at all  Food Insecurity: No Food Insecurity (10/25/2018)   Received from [facility], [facility]   Hunger Vital Sign    Worried About Running Out of Food in the Last Year: Never true    Ran Out of Food in the Last Year: Never true  Transportation Needs: No Transportation Needs (10/25/2018)   Received from [facility], [facility]   [facility] - Transportation    In the past 12 months, has lack of transportation kept you from medical appointments or from getting medications?: No    Lack of Transportation (Non-Medical): No  Physical Activity: Insufficiently Active (10/25/2018)   Received from [facility], [facility]   Exercise Vital Sign    Days of Exercise per Week: 2 days    Minutes of Exercise per Session: 10 min  Stress: Not on file  Social Connections: Not on file     Review of Systems     Objective:     BP 110/68   Pulse 78   Temp 98 F (36.7 C)   Resp 16   Ht 5\' 6"  (1.676 m)   Wt 137 lb 3.2 oz (62.2 kg)   LMP 10/08/2011   SpO2 99%   BMI 22.14 kg/m  Wt Readings from Last 3 Encounters:  06/17/23 137 lb 3.2 oz (62.2 kg)  06/02/23 138 lb 9.6 oz (62.9 kg)  02/28/23 131 lb 3.2 oz (59.5 kg)    Physical Exam  {Perform Simple Foot Exam  Perform Detailed exam:1} {Insert foot Exam (Optional):30965}   Outpatient  Encounter Medications as of 06/17/2023  Medication Sig   ALPRAZolam (XANAX) 0.5 MG tablet 0.5 mg 3 (three) times daily as needed.    buPROPion (WELLBUTRIN XL) 150 MG 24 hr tablet Take 150 mg by mouth every morning.   Cholecalciferol 25 MCG (1000 UT) capsule Take 1,000 Units by mouth daily.   clobetasol (TEMOVATE) 0.05 % external solution Apply 1 Application topically 2 (two) times daily. Apply 1-2 gtts to aa scalp bid until clear, then prn flares, avoid face, groin, axilla   clobetasol cream (TEMOVATE) 0.05 % Apply to itchy rash QD-BID PRN. Avoid applying to face, groin, and axilla. Use as directed. Long-term use can cause thinning of the skin.   Clobetasol Propionate 0.05 % shampoo Massage into dry scalp 3x/wk and let sit 5-10 minutes before rinsing.   diphenoxylate-atropine (LOMOTIL) 2.5-0.025 MG tablet Take 1 tablet by mouth 4 (four) times daily as needed for diarrhea or loose stools.   EPINEPHrine (EPIPEN 2-PAK) 0.3 mg/0.3 mL IJ SOAJ injection USE AS DIRECTED ON PACKAGE   Ivermectin (SOOLANTRA) 1 % CREA Apply to face qhs for rosacea.   levothyroxine (SYNTHROID) 100 MCG tablet TAKE 1 TABLET(100 MCG) BY MOUTH DAILY BEFORE BREAKFAST   Mavacamten (CAMZYOS) 5 MG CAPS Take by mouth.   Mavacamten 5 MG CAPS Take by mouth.   ondansetron (ZOFRAN) 4 MG tablet Take 1 tablet (4 mg total) by mouth 2 (two) times daily as needed for nausea or vomiting.   pantoprazole (PROTONIX) 40 MG tablet TAKE 1 TABLET BY MOUTH DAILY   SKYRIZI 150 MG/ML SOSY prefilled syringe INJECT 1 SYRINGE SUBCUTANEOUSLY  EVERY 12 WEEKS   SUMAtriptan (IMITREX) 50 MG tablet Take 1 tablet by mouth  every 2 hours as needed for migraine(s)   Tapinarof (VTAMA) 1 % CREA Apply 1 application topically daily. Apply to affected areas face, underarms, body once a day for psoriasis.   vitamin B-12 (CYANOCOBALAMIN) 1000 MCG tablet Take 1 tablet (1,000 mcg total) by mouth daily.   No facility-administered encounter medications on file as of  06/17/2023.     Lab Results  Component Value Date   WBC 2.7 (L) 10/29/2022   HGB 11.8 10/29/2022   HCT 36.2 10/29/2022   PLT 136 (L) 10/29/2022   GLUCOSE 87 10/29/2022   CHOL 168 10/29/2022   TRIG 53 10/29/2022   HDL 76 10/29/2022   LDLCALC 81 10/29/2022   ALT 39 (H) 10/29/2022   AST 34 10/29/2022   NA 142 10/29/2022   K 4.0 10/29/2022   CL 105 10/29/2022   CREATININE 0.88 10/29/2022   BUN 17 10/29/2022   CO2  27 10/29/2022   TSH 1.390 12/20/2022   INR 1.0 09/26/2014   HGBA1C 4.9 12/12/2014    MM 3D SCREEN BREAST BILATERAL Result Date: 05/13/2022 CLINICAL DATA:  Screening. EXAM: DIGITAL SCREENING BILATERAL MAMMOGRAM WITH TOMOSYNTHESIS AND CAD TECHNIQUE: Bilateral screening digital craniocaudal and mediolateral oblique mammograms were obtained. Bilateral screening digital breast tomosynthesis was performed. The images were evaluated with computer-aided detection. COMPARISON:  Previous exam(s). ACR Breast Density Category c: The breast tissue is heterogeneously dense, which may obscure small masses. FINDINGS: There are no findings suspicious for malignancy. IMPRESSION: No mammographic evidence of malignancy. A result letter of this screening mammogram will be mailed directly to the patient. RECOMMENDATION: Screening mammogram in one year. (Code:SM-B-01Y) BI-RADS CATEGORY  1: Negative. Electronically Signed   By: Edwin Cap M.D.   On: 05/13/2022 10:43       Assessment & Plan:  Encounter for screening mammogram for malignant neoplasm of breast -     3D Screening Mammogram, Left and Right; Future  Health care maintenance Assessment & Plan: Mammogram 05/12/22 - Birads I.  Colonoscopy 09/10/21 - diverticulosis and internal hemorrhoids.   Recommended f/u in 10 years.  Followed by gyn. Physical today 06/17/23.       Dale Belville, MD

## 2023-06-18 ENCOUNTER — Encounter: Payer: Self-pay | Admitting: Internal Medicine

## 2023-06-18 NOTE — Assessment & Plan Note (Addendum)
S/p septal myomectomy.  S/p ICD placement.  Followed by cardiology/Dr Regino Schultze. Stable. Had f/u ECHO 06/08/23 -calculated EF 50% with indeterminate diastolic function, trivial AR, mild MR, trivial PR and mild TR. No chest pain or sob reported. Feels breathing stable.

## 2023-06-18 NOTE — Assessment & Plan Note (Signed)
Blood pressure as outlined.  Overall stable.  On no medication. Continue to monitor.

## 2023-06-18 NOTE — Assessment & Plan Note (Signed)
Follow cbc.

## 2023-06-18 NOTE — Assessment & Plan Note (Signed)
On thyroid replacement.  Follow tsh.

## 2023-06-18 NOTE — Assessment & Plan Note (Signed)
On protonix.  Takes TUMS prn.  Some dysphagia reported.  She raised the question of eosinophilic esophagitis.  Pt requested f/u with GI to discuss.

## 2023-06-18 NOTE — Addendum Note (Signed)
Addended by: Charm Barges on: 06/18/2023 08:53 PM   Modules accepted: Level of Service

## 2023-06-18 NOTE — Assessment & Plan Note (Signed)
Increased stress.  Discussed.  Seeing psychiatry.  Continue to follow up with her.  Continues on wellbutrin. Follow.

## 2023-06-18 NOTE — Assessment & Plan Note (Signed)
Has been evaluated by hematology.  Previously received iron infusions.  Follow cbc and iron studies.

## 2023-06-18 NOTE — Assessment & Plan Note (Signed)
Discussed today - increased stress - home/family stress.  Seeing Dr Evelene Croon.  On wellbutrin. Notify me if feels needs any further intervention.

## 2023-06-18 NOTE — Assessment & Plan Note (Signed)
Has been followed by GI - IBS.  W/up as outlined.  Request referral back to GI.

## 2023-06-18 NOTE — Assessment & Plan Note (Signed)
Followed by Dr Regino Schultze. Stable.

## 2023-06-18 NOTE — Assessment & Plan Note (Signed)
Has previously had GI w/up.  Colonoscopy 08/2021.  Has tried lamotil.  Also TUMS.  Has tried adjusting diet, fiber, probiotics, etc.  No real change in symptoms.  Requested f/u with GI.

## 2023-06-18 NOTE — Assessment & Plan Note (Signed)
Seeing neurology.  Has imitrex to take prn.

## 2023-06-21 ENCOUNTER — Ambulatory Visit: Payer: Managed Care, Other (non HMO) | Admitting: Dermatology

## 2023-06-21 DIAGNOSIS — Z79899 Other long term (current) drug therapy: Secondary | ICD-10-CM

## 2023-06-21 DIAGNOSIS — L409 Psoriasis, unspecified: Secondary | ICD-10-CM

## 2023-06-21 DIAGNOSIS — L719 Rosacea, unspecified: Secondary | ICD-10-CM | POA: Diagnosis not present

## 2023-06-21 MED ORDER — IVERMECTIN 1 % EX CREA: TOPICAL_CREAM | CUTANEOUS | 5 refills | Status: AC

## 2023-06-21 MED ORDER — VTAMA 1 % EX CREA: 1.0000 "application " | TOPICAL_CREAM | Freq: Every day | CUTANEOUS | 3 refills | Status: AC

## 2023-06-21 MED ORDER — CLOBETASOL PROPIONATE 0.05 % EX SOLN: 1.0000 | Freq: Two times a day (BID) | CUTANEOUS | 1 refills | Status: AC

## 2023-06-21 MED ORDER — SKYRIZI 150 MG/ML ~~LOC~~ SOSY: PREFILLED_SYRINGE | SUBCUTANEOUS | 0 refills | Status: DC

## 2023-06-21 MED ORDER — CICLOPIROX 1 % EX SHAM: MEDICATED_SHAMPOO | CUTANEOUS | 11 refills | Status: AC

## 2023-06-21 NOTE — Patient Instructions (Signed)

## 2023-06-21 NOTE — Progress Notes (Signed)
Follow-Up Visit   Subjective  Carol Mahoney is a 48 y.o. female who presents for the following: 6 month follow-up psoriasis of the scalp and body. She is much improved with Skyrizi injections, with flares only in the scalp occasionally. No infections, no injection site reactions, no side effects. She uses clobetasol solution as needed. She tried clobetasol shampoo, but it didn't help much. She also has Rosacea, controlled with Soolantra cream. Needs refills.   The following portions of the chart were reviewed this encounter and updated as appropriate: medications, allergies, medical history  Review of Systems:  No other skin or systemic complaints except as noted in HPI or Assessment and Plan.  Objective  Well appearing patient in no apparent distress; mood and affect are within normal limits.  Areas Examined: Face, scalp, extremities, trunk  Relevant exam findings are noted in the Assessment and Plan.      Assessment & Plan   PSORIASIS   Related Procedures CBC with Differential/Platelet QuantiFERON-TB Gold Plus Comprehensive metabolic panel Related Medications clobetasol (TEMOVATE) 0.05 % external solution Apply 1 Application topically 2 (two) times daily. Apply 1-2 gtts to aa scalp bid until clear, then prn flares, avoid face, groin, axilla Tapinarof (VTAMA) 1 % CREA Apply 1 application  topically daily. Apply to affected areas face, underarms, body once a day for psoriasis. SKYRIZI 150 MG/ML SOSY prefilled syringe INJECT 1 SYRINGE SUBCUTANEOUSLY  EVERY 12 WEEKS ROSACEA   Related Medications Ivermectin (SOOLANTRA) 1 % CREA Apply to face qhs for rosacea.  PSORIASIS on systemic treatment  Scaling of the posterior crown.   2% BSA.  Chronic and persistent condition with duration or expected duration over one year. Condition is improving with Skyrizi injections but not currently at goal.   Counseling and coordination of care for severe psoriasis on systemic  treatment  Psoriasis - severe on systemic treatment.  Psoriasis is a chronic non-curable, but treatable genetic/hereditary disease that may have other systemic features affecting other organ systems such as joints (Psoriatic Arthritis).  It is linked with heart disease, inflammatory bowel disease, non-alcoholic fatty liver disease, and depression. Significant skin psoriasis and/or psoriatic arthritis may have significant symptoms and affects activities of daily activity and often benefits from systemic treatments.  These systemic treatments have some potential side effects including immunosuppression and require pre-treatment laboratory screening and periodic laboratory monitoring and periodic in person evaluation and monitoring by the attending dermatologist physician (long term medication management).   Patient denies joint pain.  Treatment Plan: Labs due 07/2023. Order given.  continue Skyrizi sq injections q 12 wks 1Rf sent in. Will send in remaining refills pending labs. Continue clobetasol solution Apply a few drops to AA scalp QD/BID prn itch. Avoid face, groin, axilla.  Continue Vtama cream to AA scalp at bedtime.   Reviewed risks of biologics including immunosuppression, infections, injection site reaction, and failure to improve condition. Goal is control of skin condition, not cure.  Some older biologics such as Humira and Enbrel may slightly increase risk of malignancy and may worsen congestive heart failure.  Taltz and Cosentyx may cause inflammatory bowel disease to flare. The use of biologics requires long term medication management, including periodic office visits and monitoring of blood work.   Topical steroids (such as triamcinolone, fluocinolone, fluocinonide, mometasone, clobetasol, halobetasol, betamethasone, hydrocortisone) can cause thinning and lightening of the skin if they are used for too long in the same area. Your physician has selected the right strength medicine for your  problem and area  affected on the body. Please use your medication only as directed by your physician to prevent side effects.   Long term medication management.  Patient is using long term (months to years) prescription medication  to control their dermatologic condition.  These medications require periodic monitoring to evaluate for efficacy and side effects and may require periodic laboratory monitoring.   ROSACEA Exam Nose and malar cheeks with mild erythema and telangiectasias.   Chronic condition with duration or expected duration over one year. Currently well-controlled.   Rosacea is a chronic progressive skin condition usually affecting the face of adults, causing redness and/or acne bumps. It is treatable but not curable. It sometimes affects the eyes (ocular rosacea) as well. It may respond to topical and/or systemic medication and can flare with stress, sun exposure, alcohol, exercise, topical steroids (including hydrocortisone/cortisone 10) and some foods.  Daily application of broad spectrum spf 30+ sunscreen to face is recommended to reduce flares.  Patient denies grittiness of the eyes.  Treatment Plan Continue Soolantra Cream QHS face.   Return in about 6 months (around 12/19/2023) for Psoriasis.  I, Inez Pilgrim., CMA, am acting as scribe for Willeen Niece, MD .   Documentation: I have reviewed the above documentation for accuracy and completeness, and I agree with the above.  Willeen Niece, MD

## 2023-07-29 LAB — LIPID PANEL
Chol/HDL Ratio: 2.5 ratio (ref 0.0–4.4)
Cholesterol, Total: 179 mg/dL (ref 100–199)
HDL: 72 mg/dL (ref >39)
LDL Chol Calc (NIH): 94 mg/dL (ref 0–99)
Triglycerides: 66 mg/dL (ref 0–149)
VLDL Cholesterol Cal: 13 mg/dL (ref 5–40)

## 2023-07-29 LAB — CBC WITH DIFFERENTIAL/PLATELET
Basophils Absolute: 0.1 10*3/uL (ref 0.0–0.2)
Basos: 2 %
EOS (ABSOLUTE): 0.2 10*3/uL (ref 0.0–0.4)
Eos: 4 %
Hematocrit: 43 % (ref 34.0–46.6)
Hemoglobin: 14.1 g/dL (ref 11.1–15.9)
Immature Grans (Abs): 0 10*3/uL (ref 0.0–0.1)
Immature Granulocytes: 0 %
Lymphocytes Absolute: 1.4 10*3/uL (ref 0.7–3.1)
Lymphs: 30 %
MCH: 29 pg (ref 26.6–33.0)
MCHC: 32.8 g/dL (ref 31.5–35.7)
MCV: 89 fL (ref 79–97)
Monocytes Absolute: 0.4 10*3/uL (ref 0.1–0.9)
Monocytes: 8 %
Neutrophils Absolute: 2.6 10*3/uL (ref 1.4–7.0)
Neutrophils: 56 %
Platelets: 196 10*3/uL (ref 150–450)
RBC: 4.86 x10E6/uL (ref 3.77–5.28)
RDW: 12.5 % (ref 11.7–15.4)
WBC: 4.7 10*3/uL (ref 3.4–10.8)

## 2023-07-29 LAB — BASIC METABOLIC PANEL
BUN/Creatinine Ratio: 16 (ref 9–23)
BUN: 13 mg/dL (ref 6–24)
CO2: 24 mmol/L (ref 20–29)
Calcium: 9.8 mg/dL (ref 8.7–10.2)
Chloride: 100 mmol/L (ref 96–106)
Creatinine, Ser: 0.82 mg/dL (ref 0.57–1.00)
Glucose: 74 mg/dL (ref 70–99)
Potassium: 4.2 mmol/L (ref 3.5–5.2)
Sodium: 141 mmol/L (ref 134–144)
eGFR: 87 mL/min/{1.73_m2} (ref >59)

## 2023-07-29 LAB — HEPATIC FUNCTION PANEL
ALT: 25 IU/L (ref 0–32)
AST: 28 IU/L (ref 0–40)
Albumin: 4.4 g/dL (ref 3.9–4.9)
Alkaline Phosphatase: 142 IU/L — ABNORMAL HIGH (ref 44–121)
Bilirubin Total: 0.6 mg/dL (ref 0.0–1.2)
Bilirubin, Direct: 0.2 mg/dL (ref 0.00–0.40)
Total Protein: 7.1 g/dL (ref 6.0–8.5)

## 2023-07-29 LAB — TSH: TSH: 1.42 u[IU]/mL (ref 0.450–4.500)

## 2023-08-01 ENCOUNTER — Telehealth: Payer: Self-pay

## 2023-08-01 DIAGNOSIS — R7989 Other specified abnormal findings of blood chemistry: Secondary | ICD-10-CM

## 2023-08-01 NOTE — Telephone Encounter (Signed)
 [facility] labs ordered.

## 2023-08-02 ENCOUNTER — Telehealth: Payer: Self-pay

## 2023-08-02 DIAGNOSIS — L409 Psoriasis, unspecified: Secondary | ICD-10-CM

## 2023-08-02 LAB — CBC WITH DIFFERENTIAL/PLATELET
Basophils Absolute: 0.1 10*3/uL (ref 0.0–0.2)
Basos: 2 %
EOS (ABSOLUTE): 0.2 10*3/uL (ref 0.0–0.4)
Eos: 4 %
Hematocrit: 40.9 % (ref 34.0–46.6)
Hemoglobin: 14.1 g/dL (ref 11.1–15.9)
Immature Grans (Abs): 0 10*3/uL (ref 0.0–0.1)
Immature Granulocytes: 0 %
Lymphocytes Absolute: 1.3 10*3/uL (ref 0.7–3.1)
Lymphs: 29 %
MCH: 29.4 pg (ref 26.6–33.0)
MCHC: 34.5 g/dL (ref 31.5–35.7)
MCV: 85 fL (ref 79–97)
Monocytes Absolute: 0.3 10*3/uL (ref 0.1–0.9)
Monocytes: 8 %
Neutrophils Absolute: 2.6 10*3/uL (ref 1.4–7.0)
Neutrophils: 57 %
Platelets: 205 10*3/uL (ref 150–450)
RBC: 4.8 x10E6/uL (ref 3.77–5.28)
RDW: 12.2 % (ref 11.7–15.4)
WBC: 4.5 10*3/uL (ref 3.4–10.8)

## 2023-08-02 LAB — QUANTIFERON-TB GOLD PLUS
QuantiFERON Mitogen Value: >10 [IU]/mL
QuantiFERON Nil Value: 0.12 [IU]/mL
QuantiFERON TB1 Ag Value: 0.08 [IU]/mL
QuantiFERON TB2 Ag Value: 0.11 [IU]/mL
QuantiFERON-TB Gold Plus: NEGATIVE

## 2023-08-02 MED ORDER — SKYRIZI 150 MG/ML ~~LOC~~ SOSY
PREFILLED_SYRINGE | SUBCUTANEOUS | 1 refills | Status: DC
Start: 2023-08-02 — End: 2023-12-07

## 2023-08-02 NOTE — Telephone Encounter (Signed)
 Advised pt of lab results.  Advised refills of Carol Mahoney would be sent to [facility]./sh

## 2023-08-02 NOTE — Telephone Encounter (Signed)
-----   Message from Willeen Niece sent at 08/02/2023  8:30 AM EDT ----- 07/28/23 CBC/diff nl, TB neg, Also reviewed hepatic panel and BMP done at same time- labs okay. please send in 2 rfs Skyrizi - please call patient

## 2023-10-18 ENCOUNTER — Ambulatory Visit: Payer: Managed Care, Other (non HMO) | Admitting: Internal Medicine

## 2023-10-18 VITALS — BP 106/68 | HR 68 | Temp 98.0°F | Resp 16 | Ht 66.0 in | Wt 135.0 lb

## 2023-10-18 DIAGNOSIS — Z95810 Presence of automatic (implantable) cardiac defibrillator: Secondary | ICD-10-CM

## 2023-10-18 DIAGNOSIS — R197 Diarrhea, unspecified: Secondary | ICD-10-CM

## 2023-10-18 DIAGNOSIS — D696 Thrombocytopenia, unspecified: Secondary | ICD-10-CM

## 2023-10-18 DIAGNOSIS — K219 Gastro-esophageal reflux disease without esophagitis: Secondary | ICD-10-CM

## 2023-10-18 DIAGNOSIS — I10 Essential (primary) hypertension: Secondary | ICD-10-CM

## 2023-10-18 DIAGNOSIS — F439 Reaction to severe stress, unspecified: Secondary | ICD-10-CM

## 2023-10-18 DIAGNOSIS — K580 Irritable bowel syndrome with diarrhea: Secondary | ICD-10-CM

## 2023-10-18 DIAGNOSIS — F341 Dysthymic disorder: Secondary | ICD-10-CM | POA: Diagnosis not present

## 2023-10-18 DIAGNOSIS — I421 Obstructive hypertrophic cardiomyopathy: Secondary | ICD-10-CM

## 2023-10-18 DIAGNOSIS — E039 Hypothyroidism, unspecified: Secondary | ICD-10-CM

## 2023-10-18 MED ORDER — PANTOPRAZOLE SODIUM 40 MG PO TBEC: 40.0000 mg | DELAYED_RELEASE_TABLET | Freq: Every day | ORAL | 3 refills | Status: AC

## 2023-10-18 NOTE — Progress Notes (Signed)
 Subjective:    Patient ID: Carol Mahoney, female    DOB: [birth year], 48 y.o.   MRN: [id number]  Patient here for  Chief Complaint  Patient presents with   Medical Management of Chronic Issues    HPI Here for a scheduled follow up - follow up regarding increased stress, hypertension, GERD, hypothyroidism and HOCM. Followed by Dr Donna Fus - cardiology - [facility]. Had f/u ECHO 06/08/23 -calculated EF 50% with indeterminate diastolic function, trivial AR, mild MR, trivial PR and mild TR. Planning for f/u echo in 10/2023. Folowed by Dr Deborra Falter for anxiety/depression. Continues on wellbutrin. Persistent increased stress. Stress with her daughter. Discussed. Breathing overall stable.  Bowels - same. Perisstent issues as outlined previously.    Past Medical History:  Diagnosis Date   Anemia    Anxiety    Depression    GERD (gastroesophageal reflux disease)    H/O hiatal hernia    Hypertension    Hypertr obst cardiomyop    mitral regurgitation, LAE - followed at [facility]   Hypothyroidism    Migraine headache    PONV (postoperative nausea and vomiting)    Past Surgical History:  Procedure Laterality Date   BUNIONECTOMY     CHOLECYSTECTOMY     COLONOSCOPY WITH PROPOFOL  N/A 09/10/2021   Procedure: COLONOSCOPY WITH PROPOFOL ;  Surgeon: Marnee Sink, MD;  Location: [facility] ENDOSCOPY;  Service: Endoscopy;  Laterality: N/A;   ESOPHAGOGASTRODUODENOSCOPY N/A 09/10/2021   Procedure: ESOPHAGOGASTRODUODENOSCOPY (EGD);  Surgeon: Marnee Sink, MD;  Location: [facility] ENDOSCOPY;  Service: Endoscopy;  Laterality: N/A;   HIATAL HERNIA REPAIR     IMPLANTABLE CARDIOVERTER DEFIBRILLATOR REVISION  05/21/2014   [facility]   PARTIAL GASTRECTOMY  2010   TONSILLECTOMY     Family History  Problem Relation Age of Onset   Anesthesia problems Father    Hypertension Mother    Hashimoto's thyroiditis Maternal Grandmother    Hypertension Maternal Grandmother    Melanoma Maternal Grandmother    Breast cancer Neg Hx    Colon cancer Neg Hx     Social History   Socioeconomic History   Marital status: Single    Spouse name: Not on file   Number of children: 1   Years of education: Not on file   Highest education level: Not on file  Occupational History   Occupation: PROGRAM ANALYST    Employer: [employer]  Tobacco Use   Smoking status: Never   Smokeless tobacco: Never  Vaping Use   Vaping status: Never Used  Substance and Sexual Activity   Alcohol use: No    Alcohol/week: 0.0 standard drinks of alcohol   Drug use: No   Sexual activity: Not Currently  Other Topics Concern   Not on file  Social History Narrative   Not on file   Social Drivers of Health   Financial Resource Strain: Low Risk  (10/25/2018)   Received from [facility], [facility]   Overall Financial Resource Strain (CARDIA)    Difficulty of Paying Living Expenses: Not hard at all  Food Insecurity: No Food Insecurity (10/25/2018)   Received from [facility], [facility]   Hunger Vital Sign    Worried About Running Out of Food in the Last Year: Never true    Ran Out of Food in the Last Year: Never true  Transportation Needs: No Transportation Needs (10/25/2018)   Received from [facility], [facility]   [facility] - Transportation  In the past 12 months, has lack of transportation kept you from medical appointments or from getting medications?: No    Lack of Transportation (Non-Medical): No  Physical Activity: Insufficiently Active (10/25/2018)   Received from [facility], [facility]   Exercise Vital Sign    Days of Exercise per Week: 2 days    Minutes of Exercise per Session: 10 min  Stress: Not on file  Social Connections: Not on file     Review of Systems  Constitutional:  Negative for appetite change and unexpected weight change.  HENT:  Negative for congestion and sinus pressure.   Respiratory:   Negative for cough and chest tightness.        Breathing stable.   Cardiovascular:  Negative for chest pain and palpitations.       No increased swelling.   Gastrointestinal:  Negative for vomiting.       Persistent bowel issues.   Genitourinary:  Negative for difficulty urinating and dysuria.  Musculoskeletal:  Negative for joint swelling and myalgias.  Skin:  Negative for color change and rash.  Neurological:  Negative for dizziness and headaches.  Psychiatric/Behavioral:  Negative for agitation.        Increased stress as outlined.        Objective:     BP 106/68   Pulse 68   Temp 98 F (36.7 C)   Resp 16   Ht 5\' 6"  (1.676 m)   Wt 135 lb (61.2 kg)   LMP 10/08/2011   SpO2 99%   BMI 21.79 kg/m  Wt Readings from Last 3 Encounters:  10/18/23 135 lb (61.2 kg)  06/17/23 137 lb 3.2 oz (62.2 kg)  06/02/23 138 lb 9.6 oz (62.9 kg)    Physical Exam Vitals reviewed.  Constitutional:      General: She is not in acute distress.    Appearance: Normal appearance.  HENT:     Head: Normocephalic and atraumatic.     Right Ear: External ear normal.     Left Ear: External ear normal.     Mouth/Throat:     Pharynx: No oropharyngeal exudate or posterior oropharyngeal erythema.  Eyes:     General: No scleral icterus.       Right eye: No discharge.        Left eye: No discharge.     Conjunctiva/sclera: Conjunctivae normal.  Neck:     Thyroid : No thyromegaly.  Cardiovascular:     Rate and Rhythm: Normal rate and regular rhythm.  Pulmonary:     Effort: No respiratory distress.     Breath sounds: Normal breath sounds. No wheezing.  Abdominal:     General: Bowel sounds are normal.     Palpations: Abdomen is soft.     Tenderness: There is no abdominal tenderness.  Musculoskeletal:        General: No swelling or tenderness.     Cervical back: Neck supple. No tenderness.  Lymphadenopathy:     Cervical: No cervical adenopathy.  Skin:    Findings: No erythema or rash.   Neurological:     Mental Status: She is alert.  Psychiatric:        Mood and Affect: Mood normal.        Behavior: Behavior normal.         Outpatient Encounter Medications as of 10/18/2023  Medication Sig   buPROPion (WELLBUTRIN XL) 300 MG 24 hr tablet Take 300 mg by mouth every morning.  mavacamten (CAMZYOS) 2.5 MG CAPS capsule Take 2.5 mg by mouth daily.   ALPRAZolam  (XANAX ) 0.5 MG tablet 0.5 mg 3 (three) times daily as needed.    Cholecalciferol  25 MCG (1000 UT) capsule Take 1,000 Units by mouth daily.   Ciclopirox  1 % shampoo Massage into scalp and let sit several minutes before rinsing. Use 2-3x/wk for maintenance, may increase with flares.   clobetasol  (TEMOVATE ) 0.05 % external solution Apply 1 Application topically 2 (two) times daily. Apply 1-2 gtts to aa scalp bid until clear, then prn flares, avoid face, groin, axilla   clobetasol  cream (TEMOVATE ) 0.05 % Apply to itchy rash QD-BID PRN. Avoid applying to face, groin, and axilla. Use as directed. Long-term use can cause thinning of the skin.   EPINEPHrine  (EPIPEN  2-PAK) 0.3 mg/0.3 mL IJ SOAJ injection USE AS DIRECTED ON PACKAGE   Ivermectin  (SOOLANTRA ) 1 % CREA Apply to face qhs for rosacea.   levothyroxine  (SYNTHROID ) 100 MCG tablet TAKE 1 TABLET(100 MCG) BY MOUTH DAILY BEFORE BREAKFAST   ondansetron  (ZOFRAN ) 4 MG tablet Take 1 tablet (4 mg total) by mouth 2 (two) times daily as needed for nausea or vomiting.   pantoprazole  (PROTONIX ) 40 MG tablet Take 1 tablet (40 mg total) by mouth daily.   SKYRIZI  150 MG/ML SOSY prefilled syringe INJECT 1 SYRINGE SUBCUTANEOUSLY  EVERY 12 WEEKS   SUMAtriptan  (IMITREX ) 50 MG tablet Take 1 tablet by mouth  every 2 hours as needed for migraine(s)   Tapinarof  (VTAMA ) 1 % CREA Apply 1 application  topically daily. Apply to affected areas face, underarms, body once a day for psoriasis.   vitamin B-12 (CYANOCOBALAMIN ) 1000 MCG tablet Take 1 tablet (1,000 mcg total) by mouth daily.    [DISCONTINUED] buPROPion (WELLBUTRIN XL) 150 MG 24 hr tablet Take 150 mg by mouth every morning.   [DISCONTINUED] diphenoxylate -atropine  (LOMOTIL ) 2.5-0.025 MG tablet Take 1 tablet by mouth 4 (four) times daily as needed for diarrhea or loose stools.   [DISCONTINUED] Mavacamten (CAMZYOS) 5 MG CAPS Take by mouth.   [DISCONTINUED] Mavacamten 5 MG CAPS Take by mouth.   [DISCONTINUED] pantoprazole  (PROTONIX ) 40 MG tablet TAKE 1 TABLET BY MOUTH DAILY   No facility-administered encounter medications on file as of 10/18/2023.     Lab Results  Component Value Date   WBC 4.5 07/28/2023   HGB 14.1 07/28/2023   HCT 40.9 07/28/2023   PLT 205 07/28/2023   GLUCOSE 74 07/28/2023   CHOL 179 07/28/2023   TRIG 66 07/28/2023   HDL 72 07/28/2023   LDLCALC 94 07/28/2023   ALT 25 07/28/2023   AST 28 07/28/2023   NA 141 07/28/2023   K 4.2 07/28/2023   CL 100 07/28/2023   CREATININE 0.82 07/28/2023   BUN 13 07/28/2023   CO2 24 07/28/2023   TSH 1.420 07/28/2023   INR 1.0 09/26/2014   HGBA1C 4.9 12/12/2014    MM 3D SCREEN BREAST BILATERAL Result Date: 05/13/2022 CLINICAL DATA:  Screening. EXAM: DIGITAL SCREENING BILATERAL MAMMOGRAM WITH TOMOSYNTHESIS AND CAD TECHNIQUE: Bilateral screening digital craniocaudal and mediolateral oblique mammograms were obtained. Bilateral screening digital breast tomosynthesis was performed. The images were evaluated with computer-aided detection. COMPARISON:  Previous exam(s). ACR Breast Density Category c: The breast tissue is heterogeneously dense, which may obscure small masses. FINDINGS: There are no findings suspicious for malignancy. IMPRESSION: No mammographic evidence of malignancy. A result letter of this screening mammogram will be mailed directly to the patient. RECOMMENDATION: Screening mammogram in one year. (Code:SM-B-01Y) BI-RADS CATEGORY  1:  Negative. Electronically Signed   By: Alger Infield M.D.   On: 05/13/2022 10:43       Assessment & Plan:  ANXIETY  DEPRESSION Assessment & Plan: Discussed today - increased stress - home/family stress.  Seeing Dr Deborra Falter. Continues on wellbutrin. Will notify me if feels needs further intervention.     Diarrhea, unspecified type Assessment & Plan: Has previously had GI w/up.  Colonoscopy 08/2021.  Has tried lamotil.  Also TUMS.  Has tried adjusting diet, fiber, probiotics, etc.  No real change in symptoms.  Requested f/u with GI. Referral has been placed. Plans to f/u.    Gastroesophageal reflux disease, unspecified whether esophagitis present Assessment & Plan: Continues on protonix . Upper symptoms stable.    Primary hypertension Assessment & Plan: Blood pressure as outlined.  Overall stable.  On no medication and doing well. Follow.    Hypertrophic obstructive cardiomyopathy (HCC) Assessment & Plan: S/p septal myomectomy.  S/p ICD placement.  Followed by cardiology/Dr Donna Fus. Stable. Had f/u ECHO 06/08/23 -calculated EF 50% with indeterminate diastolic function, trivial AR, mild MR, trivial PR and mild TR. No chest pain or sob reported. Feels breathing stable. Planning for f/u echo 10/2023.    Hypothyroidism, unspecified type Assessment & Plan: On thyroid  replacement. Follow tsh.    Irritable bowel syndrome with diarrhea Assessment & Plan: Has been followed by GI - IBS.  W/up as outlined.  Request referral back to GI.     ICD (implantable cardioverter-defibrillator) in place Assessment & Plan: Followed by Dr Donna Fus. Stable.    Stress Assessment & Plan: Increased stress.  Discussed.  Seeing psychiatry.  Continue to follow up. Will notify me if feels needs further intervention.    Thrombocytopenia (HCC) Assessment & Plan: Follow [facility].    Other orders -     Pantoprazole  Sodium; Take 1 tablet (40 mg total) by mouth daily.  Dispense: 90 tablet; Refill: 3     Dellar Fenton, MD

## 2023-10-19 ENCOUNTER — Ambulatory Visit: Payer: Self-pay | Admitting: Internal Medicine

## 2023-10-19 LAB — ALKALINE PHOSPHATASE: Alkaline Phosphatase: 105 IU/L (ref 44–121)

## 2023-10-19 LAB — GAMMA GT: GGT: 8 IU/L (ref 0–60)

## 2023-10-23 ENCOUNTER — Encounter: Payer: Self-pay | Admitting: Internal Medicine

## 2023-10-23 NOTE — Assessment & Plan Note (Signed)
 Has previously had GI w/up.  Colonoscopy 08/2021.  Has tried lamotil.  Also TUMS.  Has tried adjusting diet, fiber, probiotics, etc.  No real change in symptoms.  Requested f/u with GI. Referral has been placed. Plans to f/u.

## 2023-10-23 NOTE — Assessment & Plan Note (Signed)
 Continues on protonix . Upper symptoms stable.

## 2023-10-23 NOTE — Assessment & Plan Note (Signed)
 Followed by Dr Regino Schultze. Stable.

## 2023-10-23 NOTE — Assessment & Plan Note (Signed)
 Increased stress.  Discussed.  Seeing psychiatry.  Continue to follow up. Will notify me if feels needs further intervention.

## 2023-10-23 NOTE — Assessment & Plan Note (Signed)
 On thyroid replacement.  Follow tsh.

## 2023-10-23 NOTE — Assessment & Plan Note (Signed)
 Blood pressure as outlined.  Overall stable.  On no medication and doing well. Follow.

## 2023-10-23 NOTE — Assessment & Plan Note (Signed)
 S/p septal myomectomy.  S/p ICD placement.  Followed by cardiology/Dr Donna Fus. Stable. Had f/u ECHO 06/08/23 -calculated EF 50% with indeterminate diastolic function, trivial AR, mild MR, trivial PR and mild TR. No chest pain or sob reported. Feels breathing stable. Planning for f/u echo 10/2023.

## 2023-10-23 NOTE — Assessment & Plan Note (Signed)
 Has been followed by GI - IBS.  W/up as outlined.  Request referral back to GI.

## 2023-10-23 NOTE — Assessment & Plan Note (Signed)
 Discussed today - increased stress - home/family stress.  Seeing Dr Deborra Falter. Continues on wellbutrin. Will notify me if feels needs further intervention.

## 2023-10-23 NOTE — Assessment & Plan Note (Signed)
 Follow [facility].

## 2023-10-28 ENCOUNTER — Ambulatory Visit
Admission: RE | Admit: 2023-10-28 | Discharge: 2023-10-28 | Disposition: A | Discharge status: Home / Self Care | Source: Ambulatory Visit | Attending: Internal Medicine | Admitting: Internal Medicine

## 2023-10-28 DIAGNOSIS — Z1231 Encounter for screening mammogram for malignant neoplasm of breast: Secondary | ICD-10-CM | POA: Diagnosis present

## 2023-11-27 ENCOUNTER — Encounter: Payer: Self-pay | Admitting: Internal Medicine

## 2023-11-28 NOTE — Telephone Encounter (Signed)
 Letter for jury duty printed and placed in box.

## 2023-11-28 NOTE — Telephone Encounter (Signed)
 Pt is aware that letter is complete and ready for pick up. Pt stated that she would be by tomorrow for pick up.

## 2023-12-02 ENCOUNTER — Other Ambulatory Visit: Payer: Self-pay | Admitting: Internal Medicine

## 2023-12-02 ENCOUNTER — Encounter: Payer: Self-pay | Admitting: Internal Medicine

## 2023-12-02 ENCOUNTER — Other Ambulatory Visit: Payer: Self-pay

## 2023-12-02 MED ORDER — LEVOTHYROXINE SODIUM 100 MCG PO TABS: 100.0000 ug | ORAL_TABLET | Freq: Every day | ORAL | 1 refills | Status: AC

## 2023-12-02 NOTE — Telephone Encounter (Signed)
 Previously refilled by Dr. Maribeth

## 2023-12-02 NOTE — Telephone Encounter (Signed)
 Ok to refill her synthroid . This is a regular medication she takes.

## 2023-12-02 NOTE — Telephone Encounter (Signed)
 Medication has been refilled.

## 2023-12-07 ENCOUNTER — Ambulatory Visit: Admitting: Dermatology

## 2023-12-07 DIAGNOSIS — L409 Psoriasis, unspecified: Secondary | ICD-10-CM

## 2023-12-07 DIAGNOSIS — Z7189 Other specified counseling: Secondary | ICD-10-CM

## 2023-12-07 DIAGNOSIS — L408 Other psoriasis: Secondary | ICD-10-CM | POA: Diagnosis not present

## 2023-12-07 DIAGNOSIS — Z79899 Other long term (current) drug therapy: Secondary | ICD-10-CM

## 2023-12-07 MED ORDER — SKYRIZI 150 MG/ML ~~LOC~~ SOSY: PREFILLED_SYRINGE | SUBCUTANEOUS | 1 refills | Status: AC

## 2023-12-07 NOTE — Progress Notes (Signed)
 Follow-Up Visit   Subjective  Carol Mahoney is a 48 y.o. female who presents for the following: Psoriasis of the scalp, face - improved with Skyrizi  injections (started 07/2021), she used to get psoriasis all over her body and severe in her scalp before taking Skyrizi , clobetasol  solution, ciclopirox  shampoo, and Vtama  cream. She has a few spots flare on her face and scalp when it's close to time for next injection. No infections or side effects from medicine.    The following portions of the chart were reviewed this encounter and updated as appropriate: medications, allergies, medical history  Review of Systems:  No other skin or systemic complaints except as noted in HPI or Assessment and Plan.  Objective  Well appearing patient in no apparent distress; mood and affect are within normal limits.  Areas Examined: Face, scalp  Relevant exam findings are noted in the Assessment and Plan.      Assessment & Plan   PSORIASIS   Related Medications clobetasol  (TEMOVATE ) 0.05 % external solution Apply 1 Application topically 2 (two) times daily. Apply 1-2 gtts to aa scalp bid until clear, then prn flares, avoid face, groin, axilla Tapinarof  (VTAMA ) 1 % CREA Apply 1 application  topically daily. Apply to affected areas face, underarms, body once a day for psoriasis. SKYRIZI  150 MG/ML SOSY prefilled syringe INJECT 1 SYRINGE SUBCUTANEOUSLY  EVERY 12 WEEKS  PSORIASIS on systemic treatment Mild erythema and scale at the left posterior scalp,  1% BSA.  Chronic condition with duration or expected duration over one year. Currently well-controlled on Skyrizi  (started 07/2021).  Gets mild flare in scalp a few weeks before next injection due.   Counseling and coordination of care for severe psoriasis on systemic treatment  Psoriasis - severe on systemic treatment.  Psoriasis is a chronic non-curable, but treatable genetic/hereditary disease that may have other systemic features affecting  other organ systems such as joints (Psoriatic Arthritis).  It is linked with heart disease, inflammatory bowel disease, non-alcoholic fatty liver disease, and depression. Significant skin psoriasis and/or psoriatic arthritis may have significant symptoms and affects activities of daily activity and often benefits from systemic treatments.  These systemic treatments have some potential side effects including immunosuppression and require pre-treatment laboratory screening and periodic laboratory monitoring and periodic in person evaluation and monitoring by the attending dermatologist physician (long term medication management).   Patient with some pain in hips.  Treatment Plan: Labs reviewed from 07/28/2023 Continue Skyrizi  sq injections q 12 wks dsp 1mL 1Rf. (Started 08/10/2021) Continue clobetasol  solution Apply a few drops to AA scalp QD/BID prn itch. Avoid face, groin, axilla.  Continue Vtama  cream to AA scalp at bedtime.  Continue Ciclopirox  shampoo Massage into scalp and let sit several minutes before rinsing. Use 2-3x/wk for maintenance, may increase with flares.   Reviewed risks of biologics including immunosuppression, infections, injection site reaction, and failure to improve condition. Goal is control of skin condition, not cure.  Some older biologics such as Humira and Enbrel may slightly increase risk of malignancy and may worsen congestive heart failure.  Taltz and Cosentyx may cause inflammatory bowel disease to flare. The use of biologics requires long term medication management, including periodic office visits and monitoring of blood work.   Long term medication management.  Patient is using long term (months to years) prescription medication  to control their dermatologic condition.  These medications require periodic monitoring to evaluate for efficacy and side effects and may require periodic laboratory monitoring.   Return  in about 6 months (around 06/08/2024) for Psoriasis.  IAndrea Kerns, CMA, am acting as scribe for Rexene Rattler, MD .   Documentation: I have reviewed the above documentation for accuracy and completeness, and I agree with the above.  Rexene Rattler, MD

## 2023-12-07 NOTE — Patient Instructions (Signed)

## 2023-12-16 ENCOUNTER — Ambulatory Visit: Admitting: Internal Medicine

## 2023-12-16 VITALS — BP 100/68 | HR 71 | Resp 16 | Ht 66.0 in | Wt 132.0 lb

## 2023-12-16 DIAGNOSIS — F341 Dysthymic disorder: Secondary | ICD-10-CM

## 2023-12-16 DIAGNOSIS — K219 Gastro-esophageal reflux disease without esophagitis: Secondary | ICD-10-CM

## 2023-12-16 DIAGNOSIS — D696 Thrombocytopenia, unspecified: Secondary | ICD-10-CM

## 2023-12-16 DIAGNOSIS — Z95810 Presence of automatic (implantable) cardiac defibrillator: Secondary | ICD-10-CM

## 2023-12-16 DIAGNOSIS — I421 Obstructive hypertrophic cardiomyopathy: Secondary | ICD-10-CM | POA: Diagnosis not present

## 2023-12-16 DIAGNOSIS — I10 Essential (primary) hypertension: Secondary | ICD-10-CM | POA: Diagnosis not present

## 2023-12-16 DIAGNOSIS — E039 Hypothyroidism, unspecified: Secondary | ICD-10-CM

## 2023-12-16 DIAGNOSIS — E559 Vitamin D deficiency, unspecified: Secondary | ICD-10-CM | POA: Diagnosis not present

## 2023-12-16 DIAGNOSIS — K580 Irritable bowel syndrome with diarrhea: Secondary | ICD-10-CM

## 2023-12-16 DIAGNOSIS — D508 Other iron deficiency anemias: Secondary | ICD-10-CM

## 2023-12-16 DIAGNOSIS — R252 Cramp and spasm: Secondary | ICD-10-CM

## 2023-12-16 NOTE — Assessment & Plan Note (Signed)
 Platelet count 07/28/23 - wnl.

## 2023-12-16 NOTE — Progress Notes (Signed)
 Subjective:    Patient ID: Carol Mahoney, female    DOB: [birth year], 48 y.o.   MRN: [id number]  Patient here for  Chief Complaint  Patient presents with   Medical Management of Chronic Issues    HPI Here for a scheduled follow up - follow up regarding increased stress, hypertension, GERD, hypothyroidism and HOCM. Followed by Dr Cesario - cardiology - [facility]. Had f/u ECHO 06/08/23 -calculated EF 50% with indeterminate diastolic function, trivial AR, mild MR, trivial PR and mild TR. Planning for f/u echo in 10/2023. Folowed by Dr Vincente for anxiety/depression. Continues on wellbutrin. Persistent increased stress. Discussed. Discussed counseling. Breathing stable. Discussed recent wellness labs - total cholesterol 153, HDL 74, LDL 70 and triglycerides 41. A1c 4.7. glucose 84. Muscle cramps. Discussed staying hydrated. Stretches. Check labs. Discussed magnesium glycinate - before bed.    Past Medical History:  Diagnosis Date   Anemia    Anxiety    Depression    GERD (gastroesophageal reflux disease)    H/O hiatal hernia    Hypertension    Hypertr obst cardiomyop    mitral regurgitation, LAE - followed at [facility]   Hypothyroidism    Migraine headache    PONV (postoperative nausea and vomiting)    Past Surgical History:  Procedure Laterality Date   BUNIONECTOMY     CHOLECYSTECTOMY     COLONOSCOPY WITH PROPOFOL  N/A 09/10/2021   Procedure: COLONOSCOPY WITH PROPOFOL ;  Surgeon: Jinny Carmine, MD;  Location: [facility] ENDOSCOPY;  Service: Endoscopy;  Laterality: N/A;   ESOPHAGOGASTRODUODENOSCOPY N/A 09/10/2021   Procedure: ESOPHAGOGASTRODUODENOSCOPY (EGD);  Surgeon: Jinny Carmine, MD;  Location: [facility] - Inpatient ENDOSCOPY;  Service: Endoscopy;  Laterality: N/A;   HIATAL HERNIA REPAIR     IMPLANTABLE CARDIOVERTER DEFIBRILLATOR REVISION  05/21/2014   [facility]   PARTIAL GASTRECTOMY  2010   TONSILLECTOMY     Family History  Problem Relation Age of Onset   Anesthesia problems Father    Hypertension Mother    Hashimoto's  thyroiditis Maternal Grandmother    Hypertension Maternal Grandmother    Melanoma Maternal Grandmother    Breast cancer Neg Hx    Colon cancer Neg Hx    Social History   Socioeconomic History   Marital status: Single    Spouse name: Not on file   Number of children: 1   Years of education: Not on file   Highest education level: Not on file  Occupational History   Occupation: PROGRAM ANALYST    Employer: [employer]  Tobacco Use   Smoking status: Never   Smokeless tobacco: Never  Vaping Use   Vaping status: Never Used  Substance and Sexual Activity   Alcohol use: No    Alcohol/week: 0.0 standard drinks of alcohol   Drug use: No   Sexual activity: Not Currently  Other Topics Concern   Not on file  Social History Narrative   Not on file   Social Drivers of Health   Financial Resource Strain: Low Risk  (10/25/2018)   Received from [facility]   Overall Financial Resource Strain (CARDIA)    Difficulty of Paying Living Expenses: Not hard at all  Food Insecurity: No Food Insecurity (10/25/2018)   Received from [facility]   Hunger Vital Sign    Within the past 12 months, you worried that your food would run out before you got the money to buy more.: Never true    Within the past 12 months, the food you bought just didn't  last and you didn't have money to get more.: Never true  Transportation Needs: No Transportation Needs (10/25/2018)   Received from [facility] - Transportation    In the past 12 months, has lack of transportation kept you from medical appointments or from getting medications?: No    Lack of Transportation (Non-Medical): No  Physical Activity: Insufficiently Active (10/25/2018)   Received from [facility]   Exercise Vital Sign    On average, how many days per week do you engage in moderate to strenuous exercise (like a brisk walk)?: 2 days    On average, how many minutes do you engage  in exercise at this level?: 10 min  Stress: Not on file  Social Connections: Not on file     Review of Systems  Constitutional:  Negative for appetite change and unexpected weight change.  HENT:  Negative for congestion and sinus pressure.   Respiratory:  Negative for cough, chest tightness and shortness of breath.   Cardiovascular:  Negative for chest pain, palpitations and leg swelling.  Gastrointestinal:  Negative for abdominal pain, diarrhea, nausea and vomiting.  Genitourinary:  Negative for difficulty urinating and dysuria.  Musculoskeletal:  Positive for arthralgias. Negative for joint swelling and myalgias.  Skin:  Negative for color change and rash.  Neurological:  Negative for dizziness and headaches.  Psychiatric/Behavioral:  Negative for agitation.        Increased stress as outlined.        Objective:     BP 100/68   Pulse 71   Resp 16   Ht 5' 6 (1.676 m)   Wt 132 lb (59.9 kg)   LMP 10/08/2011   SpO2 98%   BMI 21.31 kg/m  Wt Readings from Last 3 Encounters:  12/16/23 132 lb (59.9 kg)  10/18/23 135 lb (61.2 kg)  06/17/23 137 lb 3.2 oz (62.2 kg)    Physical Exam Vitals reviewed.  Constitutional:      General: She is not in acute distress.    Appearance: Normal appearance.  HENT:     Head: Normocephalic and atraumatic.     Right Ear: External ear normal.     Left Ear: External ear normal.     Mouth/Throat:     Pharynx: No oropharyngeal exudate or posterior oropharyngeal erythema.  Eyes:     General: No scleral icterus.       Right eye: No discharge.        Left eye: No discharge.     Conjunctiva/sclera: Conjunctivae normal.  Neck:     Thyroid : No thyromegaly.  Cardiovascular:     Rate and Rhythm: Normal rate and regular rhythm.  Pulmonary:     Effort: No respiratory distress.     Breath sounds: Normal breath sounds. No wheezing.  Abdominal:     General: Bowel sounds are normal.     Palpations: Abdomen is soft.     Tenderness: There is no  abdominal tenderness.  Musculoskeletal:        General: No swelling or tenderness.     Cervical back: Neck supple. No tenderness.  Lymphadenopathy:     Cervical: No cervical adenopathy.  Skin:    Findings: No erythema or rash.  Neurological:     Mental Status: She is alert.  Psychiatric:        Mood and Affect: Mood normal.        Behavior: Behavior normal.         Outpatient  Encounter Medications as of 12/16/2023  Medication Sig   traZODone  (DESYREL ) 50 MG tablet Take 25 mg by mouth at bedtime.   ALPRAZolam  (XANAX ) 0.5 MG tablet 0.5 mg 3 (three) times daily as needed.    buPROPion (WELLBUTRIN XL) 300 MG 24 hr tablet Take 300 mg by mouth every morning.   Cholecalciferol  25 MCG (1000 UT) capsule Take 1,000 Units by mouth daily.   Ciclopirox  1 % shampoo Massage into scalp and let sit several minutes before rinsing. Use 2-3x/wk for maintenance, may increase with flares.   clobetasol  (TEMOVATE ) 0.05 % external solution Apply 1 Application topically 2 (two) times daily. Apply 1-2 gtts to aa scalp bid until clear, then prn flares, avoid face, groin, axilla   clobetasol  cream (TEMOVATE ) 0.05 % Apply to itchy rash QD-BID PRN. Avoid applying to face, groin, and axilla. Use as directed. Long-term use can cause thinning of the skin.   EPINEPHrine  (EPIPEN  2-PAK) 0.3 mg/0.3 mL IJ SOAJ injection USE AS DIRECTED ON PACKAGE   Ivermectin  (SOOLANTRA ) 1 % CREA Apply to face qhs for rosacea.   levothyroxine  (SYNTHROID ) 100 MCG tablet Take 1 tablet (100 mcg total) by mouth daily before breakfast.   mavacamten (CAMZYOS) 2.5 MG CAPS capsule Take 2.5 mg by mouth daily.   ondansetron  (ZOFRAN ) 4 MG tablet Take 1 tablet (4 mg total) by mouth 2 (two) times daily as needed for nausea or vomiting.   pantoprazole  (PROTONIX ) 40 MG tablet Take 1 tablet (40 mg total) by mouth daily.   SKYRIZI  150 MG/ML SOSY prefilled syringe INJECT 1 SYRINGE SUBCUTANEOUSLY  EVERY 12 WEEKS   SUMAtriptan  (IMITREX ) 50 MG tablet Take 1  tablet by mouth  every 2 hours as needed for migraine(s)   Tapinarof  (VTAMA ) 1 % CREA Apply 1 application  topically daily. Apply to affected areas face, underarms, body once a day for psoriasis.   [DISCONTINUED] vitamin B-12 (CYANOCOBALAMIN ) 1000 MCG tablet Take 1 tablet (1,000 mcg total) by mouth daily.   No facility-administered encounter medications on file as of 12/16/2023.     Lab Results  Component Value Date   WBC 4.5 07/28/2023   HGB 14.1 07/28/2023   HCT 40.9 07/28/2023   PLT 205 07/28/2023   GLUCOSE 74 07/28/2023   CHOL 179 07/28/2023   TRIG 66 07/28/2023   HDL 72 07/28/2023   LDLCALC 94 07/28/2023   ALT 25 07/28/2023   AST 28 07/28/2023   NA 141 07/28/2023   K 4.2 07/28/2023   CL 100 07/28/2023   CREATININE 0.82 07/28/2023   BUN 13 07/28/2023   CO2 24 07/28/2023   TSH 1.420 07/28/2023   INR 1.0 09/26/2014   HGBA1C 4.9 12/12/2014    MM 3D SCREENING MAMMOGRAM BILATERAL BREAST Result Date: 11/02/2023 CLINICAL DATA:  Screening. EXAM: DIGITAL SCREENING BILATERAL MAMMOGRAM WITH TOMOSYNTHESIS AND CAD TECHNIQUE: Bilateral screening digital craniocaudal and mediolateral oblique mammograms were obtained. Bilateral screening digital breast tomosynthesis was performed. The images were evaluated with computer-aided detection. COMPARISON:  Previous exam(s). ACR Breast Density Category c: The breasts are heterogeneously dense, which may obscure small masses. FINDINGS: There are no findings suspicious for malignancy. IMPRESSION: No mammographic evidence of malignancy. A result letter of this screening mammogram will be mailed directly to the patient. RECOMMENDATION: Screening mammogram in one year. (Code:SM-B-01Y) BI-RADS CATEGORY  1: Negative. Electronically Signed   By: Toribio Agreste M.D.   On: 11/02/2023 12:55       Assessment & Plan:  Primary hypertension Assessment & Plan: Blood pressure as  outlined. On no medication. Follow pressure. Check metabolic panel.   Orders: -      Basic metabolic panel with GFR  Hypertrophic obstructive cardiomyopathy (HCC) Assessment & Plan: S/p septal myomectomy.  S/p ICD placement.  Followed by cardiology/Dr Cesario. Stable. Had f/u ECHO 06/08/23 -calculated EF 50% with indeterminate diastolic function, trivial AR, mild MR, trivial PR and mild TR. Breathing stable. No chest pain. No changes. Follow.    Hypothyroidism, unspecified type Assessment & Plan: On thyroid  replacement. Follow tsh.   Orders: -     TSH  Vitamin D  deficiency Assessment & Plan: Check vitamin D  level with next labs.   Orders: -     VITAMIN D  25 Hydroxy (Vit-D Deficiency, Fractures)  Thrombocytopenia (HCC) Assessment & Plan: Platelet count 07/28/23 - wnl.    Muscle cramps Assessment & Plan: Cramps as outlined. Stay hydrated. Gentle stretches. Check electrolytes, magnesium and cbc. Trial of magnesium.   Orders: -     CBC with Differential/Platelet -     Magnesium  ANXIETY DEPRESSION Assessment & Plan: Discussed today - increased stress - home/family stress.  Seeing Dr Vincente. Continues on wellbutrin. Notify if feels needs further intervention.    Gastroesophageal reflux disease, unspecified whether esophagitis present Assessment & Plan: Continue protonix . No upper symptoms reported.    Iron deficiency anemia refractory to iron therapy Assessment & Plan: Has been evaluated by hematology.  Previously received iron infusions. Follow cbc and iron studies.    ICD (implantable cardioverter-defibrillator) in place Assessment & Plan: Followed by Dr Cesario. Stable.    Irritable bowel syndrome with diarrhea Assessment & Plan: Persistent issues with her bowels. Has seen GI.       Allena Hamilton, MD

## 2023-12-17 ENCOUNTER — Encounter: Payer: Self-pay | Admitting: Internal Medicine

## 2023-12-17 NOTE — Assessment & Plan Note (Signed)
 Cramps as outlined. Stay hydrated. Gentle stretches. Check electrolytes, magnesium and cbc. Trial of magnesium.

## 2023-12-17 NOTE — Assessment & Plan Note (Addendum)
 On thyroid replacement.  Follow tsh.

## 2023-12-17 NOTE — Assessment & Plan Note (Signed)
 Persistent issues with her bowels. Has seen GI.

## 2023-12-17 NOTE — Assessment & Plan Note (Signed)
 Check vitamin D level with next labs.  ?

## 2023-12-17 NOTE — Assessment & Plan Note (Signed)
Continue protonix.  No upper symptoms reported.

## 2023-12-17 NOTE — Assessment & Plan Note (Signed)
 Blood pressure as outlined. On no medication. Follow pressure. Check metabolic panel.

## 2023-12-17 NOTE — Assessment & Plan Note (Signed)
 S/p septal myomectomy.  S/p ICD placement.  Followed by cardiology/Dr Cesario. Stable. Had f/u ECHO 06/08/23 -calculated EF 50% with indeterminate diastolic function, trivial AR, mild MR, trivial PR and mild TR. Breathing stable. No chest pain. No changes. Follow.

## 2023-12-17 NOTE — Assessment & Plan Note (Signed)
 Discussed today - increased stress - home/family stress.  Seeing Dr Vincente. Continues on wellbutrin. Notify if feels needs further intervention.

## 2023-12-17 NOTE — Assessment & Plan Note (Signed)
 Has been evaluated by hematology.  Previously received iron infusions.  Follow cbc and iron studies.

## 2023-12-17 NOTE — Assessment & Plan Note (Signed)
 Followed by Dr Regino Schultze. Stable.

## 2023-12-22 ENCOUNTER — Encounter: Payer: Self-pay | Admitting: Internal Medicine

## 2023-12-22 ENCOUNTER — Other Ambulatory Visit: Payer: Self-pay

## 2023-12-22 DIAGNOSIS — R197 Diarrhea, unspecified: Secondary | ICD-10-CM

## 2023-12-22 DIAGNOSIS — D508 Other iron deficiency anemias: Secondary | ICD-10-CM

## 2023-12-22 DIAGNOSIS — K580 Irritable bowel syndrome with diarrhea: Secondary | ICD-10-CM

## 2023-12-26 ENCOUNTER — Ambulatory Visit: Payer: Managed Care, Other (non HMO) | Admitting: Dermatology

## 2023-12-26 ENCOUNTER — Encounter: Payer: Self-pay | Admitting: Internal Medicine

## 2023-12-26 NOTE — Telephone Encounter (Signed)
 Called pt and she is agreeable with going to UC today

## 2023-12-26 NOTE — Telephone Encounter (Signed)
 Called pt and it is a little raised. Pt stated that is does not hurt, but it itches like Crazy. She has not changed soaps, diet, or new medications. Advised pt to go to UC to get it checked out.

## 2023-12-27 NOTE — Telephone Encounter (Signed)
 Called pt and let her know the doc of the day agreed the she be seen in UC.

## 2024-01-02 ENCOUNTER — Encounter: Payer: Self-pay | Admitting: Internal Medicine

## 2024-01-02 ENCOUNTER — Ambulatory Visit: Payer: Self-pay

## 2024-01-02 NOTE — Telephone Encounter (Signed)
 FYI Only or Action Required?: FYI only for provider.  Patient was last seen in primary care on 12/16/2023 by Glendia Shad, MD.  Called Nurse Triage reporting No chief complaint on file..  Symptoms began yesterday.  Interventions attempted: Rest, hydration, or home remedies.  Symptoms are: gradually worsening.  Triage Disposition: No disposition on file.  Patient/caregiver understands and will follow disposition?:  Reason for Disposition  [1] MODERATE pain (e.g., interferes with normal activities) AND [2] pain comes and goes (cramps) AND [3] present > 24 hours  (Exception: Pain with Vomiting or Diarrhea - see that Guideline.)  Answer Assessment - Initial Assessment Questions Scheduled next available and pt was added to wait list. Does not wish to go to UC at this time but agrees to go if symptoms worsen. ED precautions reviewed, pt verbalized understanding.   1. LOCATION: Where does it hurt?      LLQ discomfort  2. RADIATION: Does the pain shoot anywhere else? (e.g., chest, back)     Denies  3. ONSET: When did the pain begin? (e.g., minutes, hours or days ago)   5. PATTERN Does the pain come and go, or is it constant?     Constant, changes with movement  6. SEVERITY: How bad is the pain?  (e.g., Scale 1-10; mild, moderate, or severe)     <5  7. RECURRENT SYMPTOM: Have you ever had this type of stomach pain before? If Yes, ask: When was the last time? and What happened that time?      States feels similar to IBS flare up  Protocols used: Abdominal Pain - [facility] Copied from CRM [id number]. Topic: Clinical - Medical Advice >> Jan 02, 2024  5:00 PM Leah C wrote: Reason for CRM: Pain in lower left quadrant- not where she feels like she needs to go to the ER. But she is going on vacation next week and would like to have it checked out. Appointments were showing for this Friday as earliest. Patient said that she would put a message in to Trish to respond to see  if she can be seen earlier.  [phone] (M)

## 2024-01-03 NOTE — Telephone Encounter (Signed)
 Called patient for more information. She is not having unbearable, excruciating abdominal pain. No nausea, vomiting, diarrhea, fever. Describes more as a discomfort and would only rate at a 3 localized to LLQ. Gave urgent care/ED precautions. She did agree to be seen more urgently if symptoms change or worsen. Patient is going out of town and would just like to be examined before leaving town. Patient has been scheduled with Dr Onesimo tomorrow.

## 2024-01-03 NOTE — Telephone Encounter (Signed)
 See my chart for triage.

## 2024-01-04 ENCOUNTER — Other Ambulatory Visit: Payer: Self-pay | Admitting: Internal Medicine

## 2024-01-04 ENCOUNTER — Ambulatory Visit: Admitting: Internal Medicine

## 2024-01-04 ENCOUNTER — Encounter: Payer: Self-pay | Admitting: Internal Medicine

## 2024-01-04 VITALS — BP 116/76 | HR 78 | Temp 97.4°F | Ht 66.0 in | Wt 128.0 lb

## 2024-01-04 DIAGNOSIS — I10 Essential (primary) hypertension: Secondary | ICD-10-CM

## 2024-01-04 DIAGNOSIS — R1032 Left lower quadrant pain: Secondary | ICD-10-CM

## 2024-01-04 DIAGNOSIS — F341 Dysthymic disorder: Secondary | ICD-10-CM | POA: Diagnosis not present

## 2024-01-04 MED ORDER — METHOCARBAMOL 500 MG PO TABS: 500.0000 mg | ORAL_TABLET | Freq: Three times a day (TID) | ORAL | 0 refills | Status: DC | PRN

## 2024-01-04 NOTE — Progress Notes (Signed)
 Acute Office Visit  Subjective:     Patient ID: Carol Mahoney, female    DOB: 1975/07/04, 48 y.o.   MRN: [id number]  Chief Complaint  Patient presents with   Abdominal Pain    Left lower quandrant    Abdominal Pain Pertinent negatives include no fever, nausea or vomiting.   Patient is in today for left lower quadrant abdominal pain.  Patient states that approximately 4 days ago she developed sudden onset left lower quadrant abdominal pain.  Patient states that pain was dull in nature, localized to her left lower quadrant, 4 out of 10 at its max.  Patient states that she has not had any change in her bowel movements.  No fevers or chills.  No nausea or vomiting.  Patient states that her pain is occasionally exacerbated by moving a certain direction.  She does take care of her grandson and has refrained from lifting him up the last 2 to 3 days.  Patient does have a history of hypertension not on any medication but her blood pressure is well-controlled today (116/76).  Patient was noted to have an elevated PHQ-9 and GAD scores (12 and 14 respectively).  Patient states that these are similar to her prior scores and that she see a psychiatrist as an outpatient for this.  Review of Systems  Constitutional: Negative.  Negative for chills and fever.  HENT: Negative.    Respiratory: Negative.    Cardiovascular: Negative.   Gastrointestinal:  Positive for abdominal pain. Negative for blood in stool, nausea and vomiting.  Genitourinary: Negative.   Musculoskeletal: Negative.   Neurological: Negative.   Psychiatric/Behavioral:  The patient is nervous/anxious.         Objective:    BP 116/76   Pulse 78   Temp (!) 97.4 F (36.3 C)   Ht 5' 6 (1.676 m)   Wt 128 lb (58.1 kg)   LMP 10/08/2011   SpO2 99%   BMI 20.66 kg/m    Physical Exam Constitutional:      Appearance: She is well-developed.  HENT:     Head: Normocephalic and atraumatic.  Cardiovascular:     Rate and Rhythm:  Normal rate and regular rhythm.  Pulmonary:     Breath sounds: Normal breath sounds.  Abdominal:     General: Abdomen is flat. Bowel sounds are normal. There is no distension.     Tenderness: There is abdominal tenderness in the left lower quadrant. There is no guarding or rebound.     Hernia: No hernia is present.     Comments: Patient with mild left lower quadrant tenderness to palpation.  No rebound noted.  Pain is exacerbated with flexing and internal rotation of her left lower extremity  Neurological:     Mental Status: She is alert and oriented to person, place, and time.  Psychiatric:        Mood and Affect: Mood normal.        Behavior: Behavior normal.     No results found for any visits on 01/04/24.      Assessment & Plan:   Problem List Items Addressed This Visit       Cardiovascular and Mediastinum   Hypertension   - This problem is chronic and stable -Patient is not on any medications currently -Blood pressure today is 116/76 -No further workup at this time      Relevant Orders   Comprehensive metabolic panel with GFR     Other   Abdominal  pain   - Patient with left lower quadrant abdominal pain for the last 4 days.  Pain is intermittent and dull in nature with occasional sharp exacerbations.  No fevers or chills.  No nausea or vomiting.  No change in her bowel movements. -Pain can be worsened with certain movements -Patient does take care of her grandson at home but has refrained from lifting him the last few days.  - She states that her pain is improved today but she still has some residual pain in her left lower quadrant -On exam, she does have some mild left lower quadrant tenderness to palpation with no guarding or rigidity -I suspect the pain is likely musculoskeletal in nature -Will treat the patient with Robaxin  as needed.  Patient asked to refrain from lifting up her grandson for now -Given that her pain is improving would not recommend further  workup at this time -Will check a CBC and CMP today -If pain persists or worsens would consider a CT abdomen/pelvis for further evaluation -No further workup for now      Relevant Medications   methocarbamol  (ROBAXIN ) 500 MG tablet   Other Relevant Orders   Comprehensive metabolic panel with GFR   CBC with Differential/Platelet   ANXIETY DEPRESSION - Primary   - This problem is chronic and uncontrolled -Patient with elevated PHQ-9 and GAD-7 scores today of 12 and 14 respectively.  Patient states this is her baseline and her previous scores have been around the same -Will continue with Wellbutrin and trazodone  for now -Patient has follow-up with psychiatry as an outpatient -No further workup at this time       Meds ordered this encounter  Medications   methocarbamol  (ROBAXIN ) 500 MG tablet    Sig: Take 1 tablet (500 mg total) by mouth every 8 (eight) hours as needed for muscle spasms.    Dispense:  20 tablet    Refill:  0    No follow-ups on file.  Shiesha Jahn, MD

## 2024-01-04 NOTE — Patient Instructions (Signed)
-   It was a pleasure meeting you today -I suspect you likely have a musculoskeletal type of pain since the pain appears to be getting better and worsens with certain movements -If the pain is worsening or persists please follow back with us  and you will need imaging of your abdomen (a CT of your abdomen/pelvis) -We will check some blood work on you today including your blood counts as well as your liver and kidney function. -I have prescribed Robaxin  which is a muscle relaxer to see if this will help with your pain.  You can take it up to 3 times a day as needed.  It can cause increased sedation.  Please do not take this and drive or operate heavy machinery -Anxiety and depression scores remain elevated.  Please follow-up with your psychiatrist as needed for further management -Please contact us  with any questions or concerns or if you need any refills

## 2024-01-04 NOTE — Addendum Note (Signed)
 Addended by: Dontrelle Mazon on: 01/04/2024 02:41 PM   Modules accepted: Orders

## 2024-01-04 NOTE — Assessment & Plan Note (Signed)
-   This problem is chronic and stable -Patient is not on any medications currently -Blood pressure today is 116/76 -No further workup at this time

## 2024-01-04 NOTE — Assessment & Plan Note (Signed)
-   This problem is chronic and uncontrolled -Patient with elevated PHQ-9 and GAD-7 scores today of 12 and 14 respectively.  Patient states this is her baseline and her previous scores have been around the same -Will continue with Wellbutrin and trazodone  for now -Patient has follow-up with psychiatry as an outpatient -No further workup at this time

## 2024-01-04 NOTE — Assessment & Plan Note (Signed)
-   Patient with left lower quadrant abdominal pain for the last 4 days.  Pain is intermittent and dull in nature with occasional sharp exacerbations.  No fevers or chills.  No nausea or vomiting.  No change in her bowel movements. -Pain can be worsened with certain movements -Patient does take care of her grandson at home but has refrained from lifting him the last few days.  - She states that her pain is improved today but she still has some residual pain in her left lower quadrant -On exam, she does have some mild left lower quadrant tenderness to palpation with no guarding or rigidity -I suspect the pain is likely musculoskeletal in nature -Will treat the patient with Robaxin  as needed.  Patient asked to refrain from lifting up her grandson for now -Given that her pain is improving would not recommend further workup at this time -Will check a CBC and CMP today -If pain persists or worsens would consider a CT abdomen/pelvis for further evaluation -No further workup for now

## 2024-01-05 LAB — CBC WITH DIFFERENTIAL/PLATELET
Basophils Absolute: 0.1 x10E3/uL (ref 0.0–0.2)
Basos: 3 %
EOS (ABSOLUTE): 0.2 x10E3/uL (ref 0.0–0.4)
Eos: 6 %
Hematocrit: 41.1 % (ref 34.0–46.6)
Hemoglobin: 12.9 g/dL (ref 11.1–15.9)
Immature Grans (Abs): 0 x10E3/uL (ref 0.0–0.1)
Immature Granulocytes: 0 %
Lymphocytes Absolute: 1.1 x10E3/uL (ref 0.7–3.1)
Lymphs: 34 %
MCH: 28.5 pg (ref 26.6–33.0)
MCHC: 31.4 g/dL — ABNORMAL LOW (ref 31.5–35.7)
MCV: 91 fL (ref 79–97)
Monocytes Absolute: 0.3 x10E3/uL (ref 0.1–0.9)
Monocytes: 10 %
Neutrophils Absolute: 1.5 x10E3/uL (ref 1.4–7.0)
Neutrophils: 47 %
Platelets: 151 x10E3/uL (ref 150–450)
RBC: 4.53 x10E6/uL (ref 3.77–5.28)
RDW: 12.7 % (ref 11.7–15.4)
WBC: 3.1 x10E3/uL — ABNORMAL LOW (ref 3.4–10.8)

## 2024-01-05 LAB — COMPREHENSIVE METABOLIC PANEL WITH GFR
ALT: 19 IU/L (ref 0–32)
AST: 17 IU/L (ref 0–40)
Albumin: 4.2 g/dL (ref 3.9–4.9)
Alkaline Phosphatase: 146 IU/L — ABNORMAL HIGH (ref 44–121)
BUN/Creatinine Ratio: 15 (ref 9–23)
BUN: 14 mg/dL (ref 6–24)
Bilirubin Total: 0.6 mg/dL (ref 0.0–1.2)
CO2: 25 mmol/L (ref 20–29)
Calcium: 9.4 mg/dL (ref 8.7–10.2)
Chloride: 101 mmol/L (ref 96–106)
Creatinine, Ser: 0.91 mg/dL (ref 0.57–1.00)
Globulin, Total: 2.5 g/dL (ref 1.5–4.5)
Glucose: 79 mg/dL (ref 70–99)
Potassium: 3.9 mmol/L (ref 3.5–5.2)
Sodium: 141 mmol/L (ref 134–144)
Total Protein: 6.7 g/dL (ref 6.0–8.5)
eGFR: 77 mL/min/1.73 (ref >59)

## 2024-01-06 ENCOUNTER — Ambulatory Visit: Payer: Self-pay | Admitting: Internal Medicine

## 2024-01-06 ENCOUNTER — Ambulatory Visit: Admitting: Nurse Practitioner

## 2024-01-20 ENCOUNTER — Encounter: Payer: Self-pay | Admitting: Family Medicine

## 2024-01-20 ENCOUNTER — Ambulatory Visit: Admitting: Family Medicine

## 2024-01-20 VITALS — BP 113/73 | HR 89 | Temp 98.6°F | Ht 66.0 in | Wt 131.8 lb

## 2024-01-20 DIAGNOSIS — J029 Acute pharyngitis, unspecified: Secondary | ICD-10-CM

## 2024-01-20 LAB — POCT RAPID STREP A (OFFICE): Rapid Strep A Screen: NEGATIVE

## 2024-01-20 LAB — POC COVID19 BINAXNOW: SARS Coronavirus 2 Ag: NEGATIVE

## 2024-01-20 NOTE — Progress Notes (Signed)
 Established patient visit   Patient: Carol Mahoney   DOB: 05/26/1975   48 y.o. Female  MRN: [id number] Visit Date: 01/20/2024  Today's healthcare provider: Nancyann Perry, MD   Chief Complaint  Patient presents with   Acute Visit    Patient reports that she has had a sore throat for the past few days.  Last night when she swallowed she stated that it was hurting in her ears as well.  Not any other symptoms.   Subjective    Discussed the use of AI scribe software for clinical note transcription with the patient, who gave verbal consent to proceed.  History of Present Illness   Carol Mahoney is a 48 year old female who presents with a sore throat for three days.  She has been experiencing a sore throat for the past three days, describing it as feeling like a 'razor blade throat.' The symptoms have not improved during this time. No fever, chills, sweats, runny nose, stuffy nose, or cough. She has not taken a COVID test recently and has not received a recent COVID vaccine.  She works from home and is responsible for raising her four-year-old grandson.       Medications: Outpatient Medications Prior to Visit  Medication Sig   ALPRAZolam  (XANAX ) 0.5 MG tablet 0.5 mg 3 (three) times daily as needed.    buPROPion (WELLBUTRIN XL) 300 MG 24 hr tablet Take 300 mg by mouth every morning.   Cholecalciferol  25 MCG (1000 UT) capsule Take 1,000 Units by mouth daily.   Ciclopirox  1 % shampoo Massage into scalp and let sit several minutes before rinsing. Use 2-3x/wk for maintenance, may increase with flares.   clobetasol  (TEMOVATE ) 0.05 % external solution Apply 1 Application topically 2 (two) times daily. Apply 1-2 gtts to aa scalp bid until clear, then prn flares, avoid face, groin, axilla   clobetasol  cream (TEMOVATE ) 0.05 % Apply to itchy rash QD-BID PRN. Avoid applying to face, groin, and axilla. Use as directed. Long-term use can cause thinning of the skin.   EPINEPHrine  (EPIPEN  2-PAK)  0.3 mg/0.3 mL IJ SOAJ injection USE AS DIRECTED ON PACKAGE   Ivermectin  (SOOLANTRA ) 1 % CREA Apply to face qhs for rosacea.   levothyroxine  (SYNTHROID ) 100 MCG tablet Take 1 tablet (100 mcg total) by mouth daily before breakfast.   mavacamten (CAMZYOS) 2.5 MG CAPS capsule Take 2.5 mg by mouth daily.   ondansetron  (ZOFRAN ) 4 MG tablet Take 1 tablet (4 mg total) by mouth 2 (two) times daily as needed for nausea or vomiting.   pantoprazole  (PROTONIX ) 40 MG tablet Take 1 tablet (40 mg total) by mouth daily.   SKYRIZI  150 MG/ML SOSY prefilled syringe INJECT 1 SYRINGE SUBCUTANEOUSLY  EVERY 12 WEEKS   SUMAtriptan  (IMITREX ) 50 MG tablet Take 1 tablet by mouth  every 2 hours as needed for migraine(s)   Tapinarof  (VTAMA ) 1 % CREA Apply 1 application  topically daily. Apply to affected areas face, underarms, body once a day for psoriasis.   traZODone  (DESYREL ) 50 MG tablet Take 25 mg by mouth at bedtime.   [DISCONTINUED] tiZANidine (ZANAFLEX) 2 MG tablet Take 1 tablet (2 mg total) by mouth every 8 (eight) hours as needed for muscle spasms.   No facility-administered medications prior to visit.   Review of Systems     Objective    BP 113/73 (BP Location: Left Arm, Patient Position: Sitting, Cuff Size: Normal)   Pulse 89   Temp 98.6 F (  37 C) (Oral)   Ht 5' 6 (1.676 m)   Wt 131 lb 12.8 oz (59.8 kg)   LMP 10/08/2011   SpO2 98%   BMI 21.27 kg/m   Physical Exam  General Appearance:    Well developed, well nourished female, alert, cooperative, in no acute distress  HENT:   bilateral TM normal without fluid or infection, neck without nodes, pharynx erythematous without exudate, and sinuses nontender  Eyes:    PERRL, conjunctiva/corneas clear, EOM's intact       Lungs:     Clear to auscultation bilaterally, respirations unlabored  Heart:    Normal heart rate. Normal rhythm.  2/6 blowing, holosystolic murmur at apex   Neurologic:   Awake, alert, oriented x 3. No apparent focal neurological            defect.         Results for orders placed or performed in visit on 01/20/24  POCT rapid strep A  Result Value Ref Range   Rapid Strep A Screen Negative Negative  POC COVID-19  Result Value Ref Range   SARS Coronavirus 2 Ag Negative Negative     Assessment & Plan        Acute viral pharyngitis Diagnosed with acute viral pharyngitis, likely due to a cold virus. COVID-19, strep throat, and influenza were ruled out. Expected resolution in 7-10 days. - Advised to maintain distance from others, especially her four-year-old grandson, to prevent spread. - Reassured symptoms should improve in 7-10 days.          Nancyann Perry, MD  [facility] [phone] (phone) [phone] (fax)  [facility]

## 2024-01-25 ENCOUNTER — Encounter: Payer: Self-pay | Admitting: Internal Medicine

## 2024-01-26 ENCOUNTER — Ambulatory Visit: Admitting: Nurse Practitioner

## 2024-01-26 ENCOUNTER — Encounter: Payer: Self-pay | Admitting: Nurse Practitioner

## 2024-01-26 ENCOUNTER — Telehealth: Payer: Self-pay | Admitting: Gastroenterology

## 2024-01-26 VITALS — BP 104/80 | HR 99 | Temp 98.2°F | Ht 66.0 in | Wt 122.6 lb

## 2024-01-26 DIAGNOSIS — J029 Acute pharyngitis, unspecified: Secondary | ICD-10-CM | POA: Diagnosis not present

## 2024-01-26 MED ORDER — AZITHROMYCIN 250 MG PO TABS: ORAL_TABLET | ORAL | 0 refills | Status: AC

## 2024-01-26 MED ORDER — PREDNISONE 20 MG PO TABS: 20.0000 mg | ORAL_TABLET | Freq: Every day | ORAL | 0 refills | Status: AC

## 2024-01-26 NOTE — Progress Notes (Signed)
 Established Patient Office Visit  Subjective:  Patient ID: Carol Mahoney, female    DOB: March 08, 1976  Age: 48 y.o. MRN: [id number]  CC:  Chief Complaint  Patient presents with   Sore Throat    Sore throat x 8-9 days Pain 6/10 Tested for covid & strep on 01/20/24 both were Negative Tested for covid on 01/25/24 Negative   Discussed the use of a AI scribe software for clinical note transcription with the patient, who gave verbal consent to proceed.  HPI  Carol Mahoney is a 48 year old female who presents with a persistent sore throat.  She has experienced a sore throat since January 18, 2024, described as feeling like 'swallowing razor blades.'  She was seen at [facility] on 8/29/245 and negative for COVID-19 and strep.  Over nine days, symptoms persist with pain on swallowing and throat drainage, causing vomiting after eating. No fever is present, but she experiences otalgia when swallowing. Over-the-counter medications, including Zyrtec, Tylenol , and ibuprofen, have not provided relief.  HPI   Past Medical History:  Diagnosis Date   Anemia    Anxiety    CHF (congestive heart failure) (HCC) 2014   Depression    GERD (gastroesophageal reflux disease)    H/O hiatal hernia    Heart murmur 2005?   HOCM   Hypertension    Hypertr obst cardiomyop    mitral regurgitation, LAE - followed at [facility]   Hypothyroidism    Migraine headache    PONV (postoperative nausea and vomiting)     Past Surgical History:  Procedure Laterality Date   ABDOMINAL HYSTERECTOMY  2011?   BUNIONECTOMY     CHOLECYSTECTOMY     COLONOSCOPY WITH PROPOFOL  N/A 09/10/2021   Procedure: COLONOSCOPY WITH PROPOFOL ;  Surgeon: Jinny Carmine, MD;  Location: [facility] ENDOSCOPY;  Service: Endoscopy;  Laterality: N/A;   ESOPHAGOGASTRODUODENOSCOPY N/A 09/10/2021   Procedure: ESOPHAGOGASTRODUODENOSCOPY (EGD);  Surgeon: Jinny Carmine, MD;  Location: [facility] ENDOSCOPY;  Service: Endoscopy;  Laterality: N/A;   HERNIA REPAIR  2010   HIATAL  HERNIA REPAIR     IMPLANTABLE CARDIOVERTER DEFIBRILLATOR REVISION  05/21/2014   [facility]   PARTIAL GASTRECTOMY  2010   SMALL INTESTINE SURGERY  2010?   TONSILLECTOMY      Family History  Problem Relation Age of Onset   Anesthesia problems Father    Asthma Father    Diabetes Father    Obesity Father    Hypertension Mother    Obesity Mother    Hashimoto's thyroiditis Maternal Grandmother    Hypertension Maternal Grandmother    Melanoma Maternal Grandmother    Varicose Veins Maternal Grandmother    ADD / ADHD Daughter    Anxiety disorder Daughter    Asthma Daughter    Depression Daughter    Cancer Maternal Aunt    Depression Sister    Obesity Sister    Breast cancer Neg Hx    Colon cancer Neg Hx     Social History   Socioeconomic History   Marital status: Single    Spouse name: Not on file   Number of children: 1   Years of education: Not on file   Highest education level: Not on file  Occupational History   Occupation: PROGRAM ANALYST    Employer: [employer]  Tobacco Use   Smoking status: Never   Smokeless tobacco: Never  Vaping Use   Vaping status: Never Used  Substance and Sexual Activity   Alcohol use: No    Alcohol/week:  0.0 standard drinks of alcohol   Drug use: No   Sexual activity: Not Currently  Other Topics Concern   Not on file  Social History Narrative   Not on file   Social Drivers of Health   Financial Resource Strain: Low Risk  (10/25/2018)   Received from [facility]   Overall Financial Resource Strain (CARDIA)    Difficulty of Paying Living Expenses: Not hard at all  Food Insecurity: No Food Insecurity (10/25/2018)   Received from [facility]   Hunger Vital Sign    Within the past 12 months, you worried that your food would run out before you got the money to buy more.: Never true    Within the past 12 months, the food you bought just didn't last and you didn't have money to get more.: Never true   Transportation Needs: No Transportation Needs (10/25/2018)   Received from [facility] - Transportation    In the past 12 months, has lack of transportation kept you from medical appointments or from getting medications?: No    Lack of Transportation (Non-Medical): No  Physical Activity: Insufficiently Active (10/25/2018)   Received from [facility]   Exercise Vital Sign    On average, how many days per week do you engage in moderate to strenuous exercise (like a brisk walk)?: 2 days    On average, how many minutes do you engage in exercise at this level?: 10 min  Stress: Not on file  Social Connections: Not on file  Intimate Partner Violence: Not on file     Outpatient Medications Prior to Visit  Medication Sig Dispense Refill   ALPRAZolam  (XANAX ) 0.5 MG tablet 0.5 mg 3 (three) times daily as needed.      buPROPion (WELLBUTRIN XL) 300 MG 24 hr tablet Take 300 mg by mouth every morning.     Cholecalciferol  25 MCG (1000 UT) capsule Take 1,000 Units by mouth daily.     Ciclopirox  1 % shampoo Massage into scalp and let sit several minutes before rinsing. Use 2-3x/wk for maintenance, may increase with flares. 120 mL 11   clobetasol  (TEMOVATE ) 0.05 % external solution Apply 1 Application topically 2 (two) times daily. Apply 1-2 gtts to aa scalp bid until clear, then prn flares, avoid face, groin, axilla 50 mL 1   clobetasol  cream (TEMOVATE ) 0.05 % Apply to itchy rash QD-BID PRN. Avoid applying to face, groin, and axilla. Use as directed. Long-term use can cause thinning of the skin. 30 g 0   EPINEPHrine  (EPIPEN  2-PAK) 0.3 mg/0.3 mL IJ SOAJ injection USE AS DIRECTED ON PACKAGE 2 each 0   Ivermectin  (SOOLANTRA ) 1 % CREA Apply to face qhs for rosacea. 45 g 5   levothyroxine  (SYNTHROID ) 100 MCG tablet Take 1 tablet (100 mcg total) by mouth daily before breakfast. 90 tablet 1   mavacamten (CAMZYOS) 2.5 MG CAPS capsule Take 2.5 mg by mouth daily.      ondansetron  (ZOFRAN ) 4 MG tablet Take 1 tablet (4 mg total) by mouth 2 (two) times daily as needed for nausea or vomiting. 20 tablet 0   pantoprazole  (PROTONIX ) 40 MG tablet Take 1 tablet (40 mg total) by mouth daily. 90 tablet 3   SKYRIZI  150 MG/ML SOSY prefilled syringe INJECT 1 SYRINGE SUBCUTANEOUSLY  EVERY 12 WEEKS 1 mL 1   SUMAtriptan  (IMITREX ) 50 MG tablet Take 1 tablet by mouth  every 2 hours as needed for migraine(s) 54  tablet 2   Tapinarof  (VTAMA ) 1 % CREA Apply 1 application  topically daily. Apply to affected areas face, underarms, body once a day for psoriasis. 60 g 3   traZODone  (DESYREL ) 50 MG tablet Take 25 mg by mouth at bedtime.     No facility-administered medications prior to visit.    Allergies  Allergen Reactions   Carafate [Sucralfate] Rash and Hives   Chloraprep One Step [Chlorhexidine Gluconate] Itching and Hives   Shellfish-Derived Products Swelling    Other reaction(s): Angioedema Esophageal and lip swelling   Vancomycin Itching and Dermatitis   Penicillins Hives and Rash   Sulfa Antibiotics Hives and Rash   Doxycycline  Other (See Comments)    Swallowing - Per dermatology   Soap    Trimethoprim     Other reaction(s): Unknown   Betadine [Povidone Iodine] Hives and Rash   Iodine Rash    ROS Review of Systems Negative unless indicated in HPI.    Objective:    Physical Exam  BP 104/80   Pulse 99   Temp 98.2 F (36.8 C)   Ht 5' 6 (1.676 m)   Wt 122 lb 9.6 oz (55.6 kg)   LMP 10/08/2011   SpO2 96%   BMI 19.79 kg/m  Wt Readings from Last 3 Encounters:  01/26/24 122 lb 9.6 oz (55.6 kg)  01/20/24 131 lb 12.8 oz (59.8 kg)  01/04/24 128 lb (58.1 kg)     Health Maintenance  Topic Date Due   Pneumococcal Vaccine: 50+ Years (1 of 2 - PCV) Never done   Zoster Vaccines- Shingrix (1 of 2) Never done   Cervical Cancer Screening (HPV/Pap Cotest)  Never done   COVID-19 Vaccine (5 - 2025-26 season) 01/23/2024   Influenza Vaccine  08/21/2024  (Originally 12/23/2023)   Mammogram  10/27/2024   DTaP/Tdap/Td (2 - Td or Tdap) 07/18/2031   Colonoscopy  09/11/2031   Hepatitis B Vaccines 19-59 Average Risk  Completed   Hepatitis C Screening  Completed   HIV Screening  Completed   HPV VACCINES  Aged Out   Meningococcal B Vaccine  Aged Out    There are no preventive care reminders to display for this patient.  Lab Results  Component Value Date   TSH 1.420 07/28/2023   Lab Results  Component Value Date   WBC 3.1 (L) 01/04/2024   HGB 12.9 01/04/2024   HCT 41.1 01/04/2024   MCV 91 01/04/2024   PLT 151 01/04/2024   Lab Results  Component Value Date   NA 141 01/04/2024   K 3.9 01/04/2024   CO2 25 01/04/2024   GLUCOSE 79 01/04/2024   BUN 14 01/04/2024   CREATININE 0.91 01/04/2024   BILITOT 0.6 01/04/2024   ALKPHOS 146 (H) 01/04/2024   AST 17 01/04/2024   ALT 19 01/04/2024   PROT 6.7 01/04/2024   ALBUMIN 4.2 01/04/2024   CALCIUM 9.4 01/04/2024   ANIONGAP 8 10/24/2018   EGFR 77 01/04/2024   Lab Results  Component Value Date   CHOL 179 07/28/2023   Lab Results  Component Value Date   HDL 72 07/28/2023   Lab Results  Component Value Date   LDLCALC 94 07/28/2023   Lab Results  Component Value Date   TRIG 66 07/28/2023   Lab Results  Component Value Date   CHOLHDL 2.5 07/28/2023   Lab Results  Component Value Date   HGBA1C 4.9 12/12/2014      Assessment & Plan:  Pharyngitis, unspecified etiology Assessment & Plan: Symptoms  persist over a week with severe sore throat and difficulty swallowing. Negative COVID-19 and strep tests on 01/20/24. - Prescribed Z pack and prednisone . - Advised salt water gargles for throat pain. - Recommended chloraseptic spray for pain relief. - No repeat COVID-19 or strep tests. - Instructed to contact clinic if no improvement.   Other orders -     Azithromycin ; Take 2 tablets on day 1, then 1 tablet daily on days 2 through 5  Dispense: 6 tablet; Refill: 0 -      predniSONE ; Take 1 tablet (20 mg total) by mouth daily with breakfast for 5 days.  Dispense: 5 tablet; Refill: 0    Follow-up: No follow-ups on file.   Alisa Stjames, NP

## 2024-01-26 NOTE — Telephone Encounter (Signed)
 Called and spoke to pt. Scheduled her with Carol Mahoney today 01/26/2024 @ 4 pm

## 2024-01-26 NOTE — Telephone Encounter (Signed)
 Good Afternoon Dr. Albertus,   I received a call from this patient requesting to transfer her care of to you. 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 Patient stated that she has not like the way the doctors over at [facility] have cared for her and is looking to be able to be seen with you. Patient has had a colonoscopy and EGD back in April of 2023. Patient records can be viewed in EPIC. Patient would also like to discuss further with you regarding having esophageal dysphagia. Would you please advise on scheduling this patient.    Thank you.

## 2024-01-27 NOTE — Telephone Encounter (Signed)
 Ok for appt with me, please let pt know new pt appt wait times can be lengthy JMP

## 2024-02-19 DIAGNOSIS — J029 Acute pharyngitis, unspecified: Secondary | ICD-10-CM | POA: Insufficient documentation

## 2024-02-19 NOTE — Assessment & Plan Note (Signed)
 Symptoms persist over a week with severe sore throat and difficulty swallowing. Negative COVID-19 and strep tests on 01/20/24. - Prescribed Z pack and prednisone . - Advised salt water gargles for throat pain. - Recommended chloraseptic spray for pain relief. - No repeat COVID-19 or strep tests. - Instructed to contact clinic if no improvement.

## 2024-03-16 LAB — CBC WITH DIFFERENTIAL/PLATELET
Basophils Absolute: 0.1 x10E3/uL (ref 0.0–0.2)
Basos: 2 %
EOS (ABSOLUTE): 0.1 x10E3/uL (ref 0.0–0.4)
Eos: 5 %
Hematocrit: 35.3 % (ref 34.0–46.6)
Hemoglobin: 11.9 g/dL (ref 11.1–15.9)
Immature Grans (Abs): 0 x10E3/uL (ref 0.0–0.1)
Immature Granulocytes: 0 %
Lymphocytes Absolute: 0.9 x10E3/uL (ref 0.7–3.1)
Lymphs: 38 %
MCH: 29.4 pg (ref 26.6–33.0)
MCHC: 33.7 g/dL (ref 31.5–35.7)
MCV: 87 fL (ref 79–97)
Monocytes Absolute: 0.2 x10E3/uL (ref 0.1–0.9)
Monocytes: 9 %
Neutrophils Absolute: 1.1 x10E3/uL — ABNORMAL LOW (ref 1.4–7.0)
Neutrophils: 46 %
Platelets: 135 x10E3/uL — ABNORMAL LOW (ref 150–450)
RBC: 4.05 x10E6/uL (ref 3.77–5.28)
RDW: 12.6 % (ref 11.7–15.4)
WBC: 2.3 x10E3/uL — CL (ref 3.4–10.8)

## 2024-03-16 LAB — VITAMIN D 25 HYDROXY (VIT D DEFICIENCY, FRACTURES): Vit D, 25-Hydroxy: 38.3 ng/mL (ref 30.0–100.0)

## 2024-03-16 LAB — BASIC METABOLIC PANEL WITH GFR
BUN/Creatinine Ratio: 14 (ref 9–23)
BUN: 12 mg/dL (ref 6–24)
CO2: 24 mmol/L (ref 20–29)
Calcium: 9.1 mg/dL (ref 8.7–10.2)
Chloride: 105 mmol/L (ref 96–106)
Creatinine, Ser: 0.88 mg/dL (ref 0.57–1.00)
Glucose: 92 mg/dL (ref 70–99)
Potassium: 4 mmol/L (ref 3.5–5.2)
Sodium: 141 mmol/L (ref 134–144)
eGFR: 80 mL/min/1.73 (ref >59)

## 2024-03-16 LAB — TSH: TSH: 5.46 u[IU]/mL — ABNORMAL HIGH (ref 0.450–4.500)

## 2024-03-16 LAB — MAGNESIUM: Magnesium: 1.9 mg/dL (ref 1.6–2.3)

## 2024-03-18 ENCOUNTER — Ambulatory Visit: Payer: Self-pay | Admitting: Internal Medicine

## 2024-03-19 ENCOUNTER — Encounter: Payer: Self-pay | Admitting: Internal Medicine

## 2024-03-19 ENCOUNTER — Ambulatory Visit: Admitting: Internal Medicine

## 2024-03-19 VITALS — BP 102/70 | HR 89 | Temp 98.0°F | Ht 66.0 in | Wt 128.4 lb

## 2024-03-19 DIAGNOSIS — R079 Chest pain, unspecified: Secondary | ICD-10-CM

## 2024-03-19 DIAGNOSIS — K219 Gastro-esophageal reflux disease without esophagitis: Secondary | ICD-10-CM

## 2024-03-19 DIAGNOSIS — Z1231 Encounter for screening mammogram for malignant neoplasm of breast: Secondary | ICD-10-CM

## 2024-03-19 DIAGNOSIS — D508 Other iron deficiency anemias: Secondary | ICD-10-CM

## 2024-03-19 DIAGNOSIS — G43119 Migraine with aura, intractable, without status migrainosus: Secondary | ICD-10-CM

## 2024-03-19 DIAGNOSIS — E039 Hypothyroidism, unspecified: Secondary | ICD-10-CM | POA: Diagnosis not present

## 2024-03-19 DIAGNOSIS — I4729 Other ventricular tachycardia: Secondary | ICD-10-CM

## 2024-03-19 DIAGNOSIS — D72819 Decreased white blood cell count, unspecified: Secondary | ICD-10-CM

## 2024-03-19 DIAGNOSIS — I10 Essential (primary) hypertension: Secondary | ICD-10-CM | POA: Diagnosis not present

## 2024-03-19 DIAGNOSIS — F439 Reaction to severe stress, unspecified: Secondary | ICD-10-CM

## 2024-03-19 DIAGNOSIS — R252 Cramp and spasm: Secondary | ICD-10-CM

## 2024-03-19 DIAGNOSIS — I421 Obstructive hypertrophic cardiomyopathy: Secondary | ICD-10-CM

## 2024-03-19 DIAGNOSIS — R1032 Left lower quadrant pain: Secondary | ICD-10-CM

## 2024-03-19 DIAGNOSIS — Z95810 Presence of automatic (implantable) cardiac defibrillator: Secondary | ICD-10-CM

## 2024-03-19 NOTE — Progress Notes (Signed)
 Subjective:    Patient ID: Carol Mahoney, female    DOB: Jan 28, 1976, 48 y.o.   MRN: [id number]  Patient here for  Chief Complaint  Patient presents with   Medical Management of Chronic Issues    HPI Here for a scheduled follow up - follow up regarding increased stress, hypertension, GERD, hypothyroidism and HOCM. Followed by Dr Cesario - cardiology - [facility]. Had f/u ECHO 06/08/23 -calculated EF 50% with indeterminate diastolic function, trivial AR, mild MR, trivial PR and mild TR. F/u echo - 10/2023 no significant change. Followed by Dr Vincente for anxiety/depression. Continues on wellbutrin. Had f/u with cardiology/EP 02/23/24 - f/u of her implantable cardioverter defibrillator. ICD is functioning normally. Saw neurology 03/08/24 - f/u migraine - refilled imitrex . Per note order placed for ST for cognitive training. Continue trazodone . Was seen yesterday for evaluation of rib pain (seen at [facility]). Reports that her daughter's friend had poured wax down her kithen in the drain and she went under the sink to take apart the PVC piping. She felt like her left side of her ribs had slipped. Tender to palpation and certain movements. Diagnosed with chest wall/rib - strain. Was given lidoderm  patches. Also reported some intermittent chest pressure. Will have some increased belching as well. Some arm aching. No chest pain or sob with increased activity or exertion. Symptoms do not worsen with exercise. Is scheduled for f/u echo next month. Increased stress. Persistent. Discussed.    Past Medical History:  Diagnosis Date   Anemia    Anxiety    CHF (congestive heart failure) (HCC) 2014   Depression    GERD (gastroesophageal reflux disease)    H/O hiatal hernia    Heart murmur 2005?   HOCM   Hypertension    Hypertr obst cardiomyop    mitral regurgitation, LAE - followed at [facility]   Hypothyroidism    Migraine headache    PONV (postoperative nausea and vomiting)    Past Surgical History:  Procedure Laterality  Date   ABDOMINAL HYSTERECTOMY  2011?   BUNIONECTOMY     CHOLECYSTECTOMY     COLONOSCOPY WITH PROPOFOL  N/A 09/10/2021   Procedure: COLONOSCOPY WITH PROPOFOL ;  Surgeon: Jinny Carmine, MD;  Location: [facility] ENDOSCOPY;  Service: Endoscopy;  Laterality: N/A;   ESOPHAGOGASTRODUODENOSCOPY N/A 09/10/2021   Procedure: ESOPHAGOGASTRODUODENOSCOPY (EGD);  Surgeon: Jinny Carmine, MD;  Location: [facility] ENDOSCOPY;  Service: Endoscopy;  Laterality: N/A;   HERNIA REPAIR  2010   HIATAL HERNIA REPAIR     IMPLANTABLE CARDIOVERTER DEFIBRILLATOR REVISION  05/21/2014   [facility]   PARTIAL GASTRECTOMY  2010   SMALL INTESTINE SURGERY  2010?   TONSILLECTOMY     Family History  Problem Relation Age of Onset   Anesthesia problems Father    Asthma Father    Diabetes Father    Obesity Father    Hypertension Mother    Obesity Mother    Hashimoto's thyroiditis Maternal Grandmother    Hypertension Maternal Grandmother    Melanoma Maternal Grandmother    Varicose Veins Maternal Grandmother    ADD / ADHD Daughter    Anxiety disorder Daughter    Asthma Daughter    Depression Daughter    Cancer Maternal Aunt    Depression Sister    Obesity Sister    Breast cancer Neg Hx    Colon cancer Neg Hx    Social History   Socioeconomic History   Marital status: Single    Spouse name: Not on file   Number of  children: 1   Years of education: Not on file   Highest education level: Not on file  Occupational History   Occupation: PROGRAM ANALYST    Employer: [employer]  Tobacco Use   Smoking status: Never   Smokeless tobacco: Never  Vaping Use   Vaping status: Never Used  Substance and Sexual Activity   Alcohol use: No    Alcohol/week: 0.0 standard drinks of alcohol   Drug use: No   Sexual activity: Not Currently  Other Topics Concern   Not on file  Social History Narrative   Not on file   Social Drivers of Health   Financial Resource Strain: Low Risk  (10/25/2018)   Received from [facility]    Overall Financial Resource Strain (CARDIA)    Difficulty of Paying Living Expenses: Not hard at all  Food Insecurity: No Food Insecurity (10/25/2018)   Received from [facility]   Hunger Vital Sign    Within the past 12 months, you worried that your food would run out before you got the money to buy more.: Never true    Within the past 12 months, the food you bought just didn't last and you didn't have money to get more.: Never true  Transportation Needs: No Transportation Needs (10/25/2018)   Received from [facility] - Transportation    In the past 12 months, has lack of transportation kept you from medical appointments or from getting medications?: No    Lack of Transportation (Non-Medical): No  Physical Activity: Insufficiently Active (10/25/2018)   Received from [facility]   Exercise Vital Sign    On average, how many days per week do you engage in moderate to strenuous exercise (like a brisk walk)?: 2 days    On average, how many minutes do you engage in exercise at this level?: 10 min  Stress: Not on file  Social Connections: Not on file     Review of Systems  Constitutional:  Negative for appetite change and unexpected weight change.  HENT:  Negative for congestion and sinus pressure.   Respiratory:  Negative for cough and shortness of breath.   Cardiovascular:  Negative for palpitations and leg swelling.       Intermittent chest pressure as outlined. Increased gas.   Gastrointestinal:  Negative for abdominal pain, nausea and vomiting.  Genitourinary:  Negative for difficulty urinating and dysuria.  Musculoskeletal:  Negative for joint swelling and myalgias.  Skin:  Negative for color change and rash.  Neurological:  Negative for dizziness and headaches.  Psychiatric/Behavioral:         Increased stress as outlined.        Objective:     BP 102/70   Pulse 89   Temp 98 F (36.7 C) (Oral)   Ht 5' 6 (1.676  m)   Wt 128 lb 6.4 oz (58.2 kg)   LMP 10/08/2011   SpO2 98%   BMI 20.72 kg/m  Wt Readings from Last 3 Encounters:  03/19/24 128 lb 6.4 oz (58.2 kg)  01/26/24 122 lb 9.6 oz (55.6 kg)  01/20/24 131 lb 12.8 oz (59.8 kg)    Physical Exam Vitals reviewed.  Constitutional:      General: She is not in acute distress.    Appearance: Normal appearance.  HENT:     Head: Normocephalic and atraumatic.     Right Ear: External ear normal.     Left Ear: External ear  normal.     Mouth/Throat:     Pharynx: No oropharyngeal exudate or posterior oropharyngeal erythema.  Eyes:     General: No scleral icterus.       Right eye: No discharge.        Left eye: No discharge.     Conjunctiva/sclera: Conjunctivae normal.  Neck:     Thyroid : No thyromegaly.  Cardiovascular:     Rate and Rhythm: Normal rate and regular rhythm.     Comments: 2/6 systolic murmur Pulmonary:     Effort: No respiratory distress.     Breath sounds: Normal breath sounds. No wheezing.  Abdominal:     General: Bowel sounds are normal.     Palpations: Abdomen is soft.     Tenderness: There is no abdominal tenderness.  Musculoskeletal:        General: No swelling or tenderness.     Cervical back: Neck supple. No tenderness.  Lymphadenopathy:     Cervical: No cervical adenopathy.  Skin:    Findings: No erythema or rash.  Neurological:     Mental Status: She is alert.  Psychiatric:        Mood and Affect: Mood normal.        Behavior: Behavior normal.         Outpatient Encounter Medications as of 03/19/2024  Medication Sig   ALPRAZolam  (XANAX ) 0.5 MG tablet 0.5 mg 3 (three) times daily as needed.    buPROPion (WELLBUTRIN XL) 300 MG 24 hr tablet Take 300 mg by mouth every morning.   Cholecalciferol  25 MCG (1000 UT) capsule Take 1,000 Units by mouth daily.   Ciclopirox  1 % shampoo Massage into scalp and let sit several minutes before rinsing. Use 2-3x/wk for maintenance, may increase with flares.   clobetasol   (TEMOVATE ) 0.05 % external solution Apply 1 Application topically 2 (two) times daily. Apply 1-2 gtts to aa scalp bid until clear, then prn flares, avoid face, groin, axilla   clobetasol  cream (TEMOVATE ) 0.05 % Apply to itchy rash QD-BID PRN. Avoid applying to face, groin, and axilla. Use as directed. Long-term use can cause thinning of the skin.   EPINEPHrine  (EPIPEN  2-PAK) 0.3 mg/0.3 mL IJ SOAJ injection USE AS DIRECTED ON PACKAGE   Ivermectin  (SOOLANTRA ) 1 % CREA Apply to face qhs for rosacea.   levothyroxine  (SYNTHROID ) 100 MCG tablet Take 1 tablet (100 mcg total) by mouth daily before breakfast.   mavacamten (CAMZYOS) 2.5 MG CAPS capsule Take 2.5 mg by mouth daily.   ondansetron  (ZOFRAN ) 4 MG tablet Take 1 tablet (4 mg total) by mouth 2 (two) times daily as needed for nausea or vomiting.   pantoprazole  (PROTONIX ) 40 MG tablet Take 1 tablet (40 mg total) by mouth daily.   SKYRIZI  150 MG/ML SOSY prefilled syringe INJECT 1 SYRINGE SUBCUTANEOUSLY  EVERY 12 WEEKS   SUMAtriptan  (IMITREX ) 50 MG tablet Take 1 tablet by mouth  every 2 hours as needed for migraine(s)   Tapinarof  (VTAMA ) 1 % CREA Apply 1 application  topically daily. Apply to affected areas face, underarms, body once a day for psoriasis.   traZODone  (DESYREL ) 50 MG tablet Take 25 mg by mouth at bedtime.   No facility-administered encounter medications on file as of 03/19/2024.     Lab Results  Component Value Date   WBC 2.3 (LL) 03/15/2024   HGB 11.9 03/15/2024   HCT 35.3 03/15/2024   PLT 135 (L) 03/15/2024   GLUCOSE 92 03/15/2024   CHOL 179 07/28/2023   TRIG  66 07/28/2023   HDL 72 07/28/2023   LDLCALC 94 07/28/2023   ALT 19 01/04/2024   AST 17 01/04/2024   NA 141 03/15/2024   K 4.0 03/15/2024   CL 105 03/15/2024   CREATININE 0.88 03/15/2024   BUN 12 03/15/2024   CO2 24 03/15/2024   TSH 5.460 (H) 03/15/2024   INR 1.0 09/26/2014   HGBA1C 4.9 12/12/2014    MM 3D SCREENING MAMMOGRAM BILATERAL BREAST Result Date:  11/02/2023 CLINICAL DATA:  Screening. EXAM: DIGITAL SCREENING BILATERAL MAMMOGRAM WITH TOMOSYNTHESIS AND CAD TECHNIQUE: Bilateral screening digital craniocaudal and mediolateral oblique mammograms were obtained. Bilateral screening digital breast tomosynthesis was performed. The images were evaluated with computer-aided detection. COMPARISON:  Previous exam(s). ACR Breast Density Category c: The breasts are heterogeneously dense, which may obscure small masses. FINDINGS: There are no findings suspicious for malignancy. IMPRESSION: No mammographic evidence of malignancy. A result letter of this screening mammogram will be mailed directly to the patient. RECOMMENDATION: Screening mammogram in one year. (Code:SM-B-01Y) BI-RADS CATEGORY  1: Negative. Electronically Signed   By: Toribio Agreste M.D.   On: 11/02/2023 12:55       Assessment & Plan:  Chest pain, unspecified type -     EKG 12-Lead  Hypothyroidism, unspecified type Assessment & Plan: On thyroid  replacement. Follow tsh.    Primary hypertension Assessment & Plan: Blood pressure doing well on no medication. Follow.    Encounter for screening mammogram for malignant neoplasm of breast -     3D Screening Mammogram, Left and Right; Future  Leukopenia, unspecified type Assessment & Plan: Recheck cbc to confirm stable.   Orders: -     CBC with Differential/Platelet  Gastroesophageal reflux disease, unspecified whether esophagitis present Assessment & Plan: Continues on protonix . Some increased belching as outlined. Monitor for triggers.    Hypertrophic obstructive cardiomyopathy (HCC) Assessment & Plan: S/p septal myomectomy.  S/p ICD placement.  Followed by cardiology/Dr Cesario. Stable. Had ECHO 06/08/23 -calculated EF 50% with indeterminate diastolic function, trivial AR, mild MR, trivial PR and mild TR. ECHO 10/2023 - stable. Reports the chest pressure and increased belching as outlined. EKG - LBBB. She has appt for f/u ECHO next  month. No symptoms with increased activity or exertion. Continued protonix . Monitor for triggers. Keep f/u with cardiology.    ICD (implantable cardioverter-defibrillator) in place Assessment & Plan: Followed by Dr Cesario. Stable.    Intractable migraine with aura without status migrainosus Assessment & Plan: Seeing neurology.  Has imitrex  to take prn.    Iron deficiency anemia refractory to iron therapy Assessment & Plan: Has been evaluated by hematology.  Previously received iron infusions. Follow cbc and iron studies.    NSVT (nonsustained ventricular tachycardia) (HCC) Assessment & Plan: Followed by Dr Cesario. Stable.    Stress Assessment & Plan: Increased stress.  Discussed.  Seeing psychiatry. Notify me if feels needs further intervention. Follow.       Allena Hamilton, MD

## 2024-03-20 ENCOUNTER — Ambulatory Visit: Payer: Self-pay | Admitting: Internal Medicine

## 2024-03-24 ENCOUNTER — Encounter: Payer: Self-pay | Admitting: Internal Medicine

## 2024-03-24 DIAGNOSIS — D72819 Decreased white blood cell count, unspecified: Secondary | ICD-10-CM | POA: Insufficient documentation

## 2024-03-24 NOTE — Assessment & Plan Note (Signed)
 Followed by Dr Regino Schultze. Stable.

## 2024-03-24 NOTE — Assessment & Plan Note (Signed)
Blood pressure doing well on no medication.  Follow.   

## 2024-03-24 NOTE — Assessment & Plan Note (Signed)
Recheck cbc to confirm stable.

## 2024-03-24 NOTE — Assessment & Plan Note (Signed)
 On thyroid replacement.  Follow tsh.

## 2024-03-24 NOTE — Assessment & Plan Note (Signed)
 Seeing neurology.  Has imitrex to take prn.

## 2024-03-24 NOTE — Assessment & Plan Note (Signed)
 Increased stress.  Discussed.  Seeing psychiatry. Notify me if feels needs further intervention. Follow.

## 2024-03-24 NOTE — Assessment & Plan Note (Signed)
 S/p septal myomectomy.  S/p ICD placement.  Followed by cardiology/Dr Cesario. Stable. Had ECHO 06/08/23 -calculated EF 50% with indeterminate diastolic function, trivial AR, mild MR, trivial PR and mild TR. ECHO 10/2023 - stable. Reports the chest pressure and increased belching as outlined. EKG - LBBB. She has appt for f/u ECHO next month. No symptoms with increased activity or exertion. Continued protonix . Monitor for triggers. Keep f/u with cardiology.

## 2024-03-24 NOTE — Assessment & Plan Note (Signed)
 Has been evaluated by hematology.  Previously received iron infusions.  Follow cbc and iron studies.

## 2024-03-24 NOTE — Assessment & Plan Note (Signed)
 Continues on protonix . Some increased belching as outlined. Monitor for triggers.

## 2024-04-05 ENCOUNTER — Encounter: Payer: Self-pay | Admitting: Internal Medicine

## 2024-04-05 DIAGNOSIS — D72819 Decreased white blood cell count, unspecified: Secondary | ICD-10-CM

## 2024-04-30 ENCOUNTER — Other Ambulatory Visit: Payer: Self-pay | Admitting: Internal Medicine

## 2024-04-30 NOTE — Telephone Encounter (Signed)
Pt picked this up today

## 2024-05-01 ENCOUNTER — Ambulatory Visit: Payer: Self-pay | Admitting: Internal Medicine

## 2024-05-01 LAB — CBC WITH DIFFERENTIAL/PLATELET
Basophils Absolute: 0.1 x10E3/uL (ref 0.0–0.2)
Basos: 2 %
EOS (ABSOLUTE): 0.2 x10E3/uL (ref 0.0–0.4)
Eos: 6 %
Hematocrit: 39.7 % (ref 34.0–46.6)
Hemoglobin: 13.3 g/dL (ref 11.1–15.9)
Immature Grans (Abs): 0 x10E3/uL (ref 0.0–0.1)
Immature Granulocytes: 0 %
Lymphocytes Absolute: 1.1 x10E3/uL (ref 0.7–3.1)
Lymphs: 36 %
MCH: 29 pg (ref 26.6–33.0)
MCHC: 33.5 g/dL (ref 31.5–35.7)
MCV: 87 fL (ref 79–97)
Monocytes Absolute: 0.3 x10E3/uL (ref 0.1–0.9)
Monocytes: 9 %
Neutrophils Absolute: 1.4 x10E3/uL (ref 1.4–7.0)
Neutrophils: 47 %
Platelets: 165 x10E3/uL (ref 150–450)
RBC: 4.58 x10E6/uL (ref 3.77–5.28)
RDW: 12.7 % (ref 11.7–15.4)
WBC: 3 x10E3/uL — ABNORMAL LOW (ref 3.4–10.8)

## 2024-05-14 ENCOUNTER — Ambulatory Visit: Admitting: Internal Medicine

## 2024-05-14 ENCOUNTER — Encounter: Payer: Self-pay | Admitting: Internal Medicine

## 2024-05-14 VITALS — BP 112/70 | HR 93 | Temp 99.4°F | Ht 66.0 in | Wt 125.0 lb

## 2024-05-14 DIAGNOSIS — R252 Cramp and spasm: Secondary | ICD-10-CM

## 2024-05-14 DIAGNOSIS — K219 Gastro-esophageal reflux disease without esophagitis: Secondary | ICD-10-CM

## 2024-05-14 DIAGNOSIS — D696 Thrombocytopenia, unspecified: Secondary | ICD-10-CM | POA: Diagnosis not present

## 2024-05-14 DIAGNOSIS — F439 Reaction to severe stress, unspecified: Secondary | ICD-10-CM | POA: Diagnosis not present

## 2024-05-14 DIAGNOSIS — F341 Dysthymic disorder: Secondary | ICD-10-CM | POA: Diagnosis not present

## 2024-05-14 DIAGNOSIS — R0981 Nasal congestion: Secondary | ICD-10-CM

## 2024-05-14 DIAGNOSIS — I10 Essential (primary) hypertension: Secondary | ICD-10-CM

## 2024-05-14 DIAGNOSIS — Z95810 Presence of automatic (implantable) cardiac defibrillator: Secondary | ICD-10-CM

## 2024-05-14 DIAGNOSIS — E039 Hypothyroidism, unspecified: Secondary | ICD-10-CM

## 2024-05-14 DIAGNOSIS — I4729 Other ventricular tachycardia: Secondary | ICD-10-CM | POA: Diagnosis not present

## 2024-05-14 DIAGNOSIS — I421 Obstructive hypertrophic cardiomyopathy: Secondary | ICD-10-CM | POA: Diagnosis not present

## 2024-05-14 DIAGNOSIS — D508 Other iron deficiency anemias: Secondary | ICD-10-CM | POA: Diagnosis not present

## 2024-05-14 DIAGNOSIS — E559 Vitamin D deficiency, unspecified: Secondary | ICD-10-CM

## 2024-05-14 LAB — POCT INFLUENZA A/B
Influenza A, POC: POSITIVE — AB
Influenza B, POC: NEGATIVE

## 2024-05-14 MED ORDER — OSELTAMIVIR PHOSPHATE 75 MG PO CAPS: 75.0000 mg | ORAL_CAPSULE | Freq: Two times a day (BID) | ORAL | 0 refills | Status: AC

## 2024-05-14 MED ORDER — AZITHROMYCIN 250 MG PO TABS: ORAL_TABLET | ORAL | 0 refills | Status: AC

## 2024-05-14 NOTE — Progress Notes (Signed)
 "  Subjective:    Patient ID: Carol Mahoney, female    DOB: Oct 29, 1975, 48 y.o.   MRN: [id number]  Patient here for  Chief Complaint  Patient presents with   Medical Management of Chronic Issues   Sore Throat    HPI Here for a scheduled follow up -  follow up regarding increased stress, hypertension, GERD, hypothyroidism and HOCM. Followed by Dr Cesario - cardiology - [facility]. Followed by Dr Vincente for anxiety/depression. Continues on wellbutrin. Had f/u with cardiology/EP 02/23/24 - f/u of her implantable cardioverter defibrillator. ICD is functioning normally. Saw neurology 03/08/24 - f/u migraine - refilled imitrex . Per note order placed for ST for cognitive training. Continue trazodone . Had f/u with Dr Cesario 04/02/24 - NYHA I symptoms. She has had good response to mavacamten therapy with significant reduction in her left ventricular outflow tract gradient by last TTE with normal left ventricular ejection fraction. No changes in medication. Follow up one year. Still with increased stress. Family stress. Discussed. Reports that starting last night - scratchy throat. No fever or chills. Temp - 99. No body aches. Took zyrtec yesterday. Some sinus pressure and ears stopped up. Nasal stuffiness. Tickle in her throat - some cough associated. No vomiting. Breathing overall stable.    Past Medical History:  Diagnosis Date   Anemia    Anxiety    CHF (congestive heart failure) (HCC) 2014   Depression    GERD (gastroesophageal reflux disease)    H/O hiatal hernia    Heart murmur 2005?   HOCM   Hypertension    Hypertr obst cardiomyop    mitral regurgitation, LAE - followed at [facility]   Hypothyroidism    Migraine headache    PONV (postoperative nausea and vomiting)    Past Surgical History:  Procedure Laterality Date   ABDOMINAL HYSTERECTOMY  2011?   BUNIONECTOMY     CHOLECYSTECTOMY     COLONOSCOPY WITH PROPOFOL  N/A 09/10/2021   Procedure: COLONOSCOPY WITH PROPOFOL ;  Surgeon: Jinny Carmine, MD;   Location: [facility] ENDOSCOPY;  Service: Endoscopy;  Laterality: N/A;   ESOPHAGOGASTRODUODENOSCOPY N/A 09/10/2021   Procedure: ESOPHAGOGASTRODUODENOSCOPY (EGD);  Surgeon: Jinny Carmine, MD;  Location: [facility] ENDOSCOPY;  Service: Endoscopy;  Laterality: N/A;   HERNIA REPAIR  2010   HIATAL HERNIA REPAIR     IMPLANTABLE CARDIOVERTER DEFIBRILLATOR REVISION  05/21/2014   [facility]   PARTIAL GASTRECTOMY  2010   SMALL INTESTINE SURGERY  2010?   TONSILLECTOMY     Family History  Problem Relation Age of Onset   Anesthesia problems Father    Asthma Father    Diabetes Father    Obesity Father    Hypertension Mother    Obesity Mother    Hashimoto's thyroiditis Maternal Grandmother    Hypertension Maternal Grandmother    Melanoma Maternal Grandmother    Varicose Veins Maternal Grandmother    ADD / ADHD Daughter    Anxiety disorder Daughter    Asthma Daughter    Depression Daughter    Cancer Maternal Aunt    Depression Sister    Obesity Sister    Breast cancer Neg Hx    Colon cancer Neg Hx    Social History   Socioeconomic History   Marital status: Single    Spouse name: Not on file   Number of children: 1   Years of education: Not on file   Highest education level: Not on file  Occupational History   Occupation: PROGRAM ANALYST    Employer: [employer]  Tobacco Use   Smoking status: Never   Smokeless tobacco: Never  Vaping Use   Vaping status: Never Used  Substance and Sexual Activity   Alcohol use: No    Alcohol/week: 0.0 standard drinks of alcohol   Drug use: No   Sexual activity: Not Currently  Other Topics Concern   Not on file  Social History Narrative   Not on file   Social Drivers of Health   Tobacco Use: Low Risk (05/20/2024)   Patient History    Smoking Tobacco Use: Never    Smokeless Tobacco Use: Never    Passive Exposure: Not on file  Financial Resource Strain: Not on file  Food Insecurity: Not on file  Transportation Needs: Not on file  Physical Activity: Not on  file  Stress: Not on file  Social Connections: Not on file  Depression (PHQ2-9): High Risk (05/14/2024)   Depression (PHQ2-9)    PHQ-2 Score: 14  Alcohol Screen: Not on file  Housing: Not on file  Utilities: Not on file  Health Literacy: Not on file     Review of Systems  Constitutional:  Negative for appetite change and unexpected weight change.  HENT:  Positive for congestion.        Scratchy throat. Nasal stuffiness. Tickle in her throat.   Respiratory:  Negative for chest tightness and shortness of breath.        Some cough.   Cardiovascular:  Negative for chest pain and palpitations.       No swelling currently.   Gastrointestinal:  Negative for abdominal pain, nausea and vomiting.  Genitourinary:  Negative for difficulty urinating and dysuria.  Musculoskeletal:  Negative for joint swelling and myalgias.  Skin:  Negative for color change and rash.  Neurological:  Negative for dizziness and headaches.  Psychiatric/Behavioral:         Increased stress.        Objective:     BP 112/70   Pulse 93   Temp 99.4 F (37.4 C) (Oral)   Ht 5' 6 (1.676 m)   Wt 125 lb (56.7 kg)   LMP 10/08/2011   SpO2 98%   BMI 20.18 kg/m  Wt Readings from Last 3 Encounters:  05/14/24 125 lb (56.7 kg)  03/19/24 128 lb 6.4 oz (58.2 kg)  01/26/24 122 lb 9.6 oz (55.6 kg)    Physical Exam Vitals reviewed.  Constitutional:      General: She is not in acute distress.    Appearance: Normal appearance.  HENT:     Head: Normocephalic and atraumatic.     Right Ear: External ear normal.     Left Ear: External ear normal.     Mouth/Throat:     Pharynx: No oropharyngeal exudate or posterior oropharyngeal erythema.  Eyes:     General: No scleral icterus.       Right eye: No discharge.        Left eye: No discharge.     Conjunctiva/sclera: Conjunctivae normal.  Neck:     Thyroid : No thyromegaly.  Cardiovascular:     Rate and Rhythm: Normal rate and regular rhythm.  Pulmonary:      Effort: No respiratory distress.     Breath sounds: Normal breath sounds. No wheezing.  Abdominal:     General: Bowel sounds are normal.     Palpations: Abdomen is soft.     Tenderness: There is no abdominal tenderness.  Musculoskeletal:        General: No swelling or tenderness.  Cervical back: Neck supple. No tenderness.  Lymphadenopathy:     Cervical: No cervical adenopathy.  Skin:    Findings: No erythema or rash.  Neurological:     Mental Status: She is alert.  Psychiatric:        Mood and Affect: Mood normal.        Behavior: Behavior normal.         Outpatient Encounter Medications as of 05/14/2024  Medication Sig   ALPRAZolam (XANAX) 0.5 MG tablet 0.5 mg 3 (three) times daily as needed.    [EXPIRED] azithromycin  (ZITHROMAX ) 250 MG tablet Take 2 tablets on day 1, then 1 tablet daily on days 2 through 5   buPROPion (WELLBUTRIN XL) 300 MG 24 hr tablet Take 300 mg by mouth every morning. (Patient taking differently: Take 450 mg by mouth every morning.)   Cholecalciferol 25 MCG (1000 UT) capsule Take 1,000 Units by mouth daily.   Ciclopirox  1 % shampoo Massage into scalp and let sit several minutes before rinsing. Use 2-3x/wk for maintenance, may increase with flares.   clobetasol  (TEMOVATE ) 0.05 % external solution Apply 1 Application topically 2 (two) times daily. Apply 1-2 gtts to aa scalp bid until clear, then prn flares, avoid face, groin, axilla   clobetasol  cream (TEMOVATE ) 0.05 % Apply to itchy rash QD-BID PRN. Avoid applying to face, groin, and axilla. Use as directed. Long-term use can cause thinning of the skin.   EPINEPHrine  (EPIPEN  2-PAK) 0.3 mg/0.3 mL IJ SOAJ injection USE AS DIRECTED ON PACKAGE   Ivermectin  (SOOLANTRA ) 1 % CREA Apply to face qhs for rosacea.   levothyroxine  (SYNTHROID ) 100 MCG tablet Take 1 tablet (100 mcg total) by mouth daily before breakfast.   mavacamten (CAMZYOS) 2.5 MG CAPS capsule Take 2.5 mg by mouth daily.   ondansetron  (ZOFRAN ) 4 MG  tablet Take 1 tablet (4 mg total) by mouth 2 (two) times daily as needed for nausea or vomiting.   oseltamivir  (TAMIFLU ) 75 MG capsule Take 1 capsule (75 mg total) by mouth 2 (two) times daily.   pantoprazole  (PROTONIX ) 40 MG tablet Take 1 tablet (40 mg total) by mouth daily.   SKYRIZI  150 MG/ML SOSY prefilled syringe INJECT 1 SYRINGE SUBCUTANEOUSLY  EVERY 12 WEEKS   SUMAtriptan  (IMITREX ) 50 MG tablet Take 1 tablet by mouth  every 2 hours as needed for migraine(s)   Tapinarof  (VTAMA ) 1 % CREA Apply 1 application  topically daily. Apply to affected areas face, underarms, body once a day for psoriasis.   traZODone  (DESYREL ) 50 MG tablet Take 25 mg by mouth at bedtime.   No facility-administered encounter medications on file as of 05/14/2024.     Lab Results  Component Value Date   WBC 3.0 (L) 04/30/2024   HGB 13.3 04/30/2024   HCT 39.7 04/30/2024   PLT 165 04/30/2024   GLUCOSE 92 03/15/2024   CHOL 179 07/28/2023   TRIG 66 07/28/2023   HDL 72 07/28/2023   LDLCALC 94 07/28/2023   ALT 19 01/04/2024   AST 17 01/04/2024   NA 141 03/15/2024   K 4.0 03/15/2024   CL 105 03/15/2024   CREATININE 0.88 03/15/2024   BUN 12 03/15/2024   CO2 24 03/15/2024   TSH 5.460 (H) 03/15/2024   INR 1.0 09/26/2014   HGBA1C 4.9 12/12/2014    MM 3D SCREENING MAMMOGRAM BILATERAL BREAST Result Date: 11/02/2023 CLINICAL DATA:  Screening. EXAM: DIGITAL SCREENING BILATERAL MAMMOGRAM WITH TOMOSYNTHESIS AND CAD TECHNIQUE: Bilateral screening digital craniocaudal and mediolateral oblique mammograms were  obtained. Bilateral screening digital breast tomosynthesis was performed. The images were evaluated with computer-aided detection. COMPARISON:  Previous exam(s). ACR Breast Density Category c: The breasts are heterogeneously dense, which may obscure small masses. FINDINGS: There are no findings suspicious for malignancy. IMPRESSION: No mammographic evidence of malignancy. A result letter of this screening mammogram will  be mailed directly to the patient. RECOMMENDATION: Screening mammogram in one year. (Code:SM-B-01Y) BI-RADS CATEGORY  1: Negative. Electronically Signed   By: Toribio Agreste M.D.   On: 11/02/2023 12:55       Assessment & Plan:  Nasal congestion -     POC COVID-19 BinaxNow -     POCT Influenza A/B  Primary hypertension Assessment & Plan: Blood pressure doing well on no medication. Follow.   Orders: -     Basic metabolic panel with GFR  Vitamin D  deficiency Assessment & Plan: Vitamin D  level 02/2024 - wnl.    Thrombocytopenia Assessment & Plan: Follow cbc. Last check wnl.   Orders: -     CBC with Differential/Platelet  Stress Assessment & Plan: Increased stress.  Discussed.  Seeing psychiatry. Continues on wellbutrin and trazodone . Continue f/u with psychiatry.    NSVT (nonsustained ventricular tachycardia) (HCC) Assessment & Plan: Followed by Dr Cesario.  Had f/u with Dr Cesario 04/02/24 - NYHA I symptoms. She has had good response to mavacamten therapy with significant reduction in her left ventricular outflow tract gradient by last TTE with normal left ventricular ejection fraction. No changes in medication. Follow up one year.    Iron deficiency anemia refractory to iron therapy Assessment & Plan: Has been evaluated by hematology.  Previously received iron infusions. Follow cbc and iron studies.    ICD (implantable cardioverter-defibrillator) in place Assessment & Plan: Followed by Dr Cesario. Stable.    Hypothyroidism, unspecified type Assessment & Plan: On thyroid  replacement. Follow tsh.    Hypertrophic obstructive cardiomyopathy (HCC) Assessment & Plan: S/p septal myomectomy.  S/p ICD placement.  Followed by cardiology/Dr Cesario. Stable. Had ECHO 06/08/23 -calculated EF 50% with indeterminate diastolic function, trivial AR, mild MR, trivial PR and mild TR. ECHO 10/2023 - stable.  Had f/u with Dr Cesario 04/02/24 - NYHA I symptoms. She has had good response to mavacamten  therapy with significant reduction in her left ventricular outflow tract gradient by last TTE with normal left ventricular ejection fraction. No changes in medication. Follow up one year.    Gastroesophageal reflux disease, unspecified whether esophagitis present Assessment & Plan: Continues on protonix . No upper symptoms reported.    ANXIETY DEPRESSION Assessment & Plan: Discussed today - increased stress - home/family stress.  Seeing Dr Vincente. Continues on wellbutrin. Continue f/u with psychiatry. Notify me if feels needs further intervention.    Other orders -     Azithromycin ; Take 2 tablets on day 1, then 1 tablet daily on days 2 through 5  Dispense: 6 tablet; Refill: 0 -     Oseltamivir  Phosphate; Take 1 capsule (75 mg total) by mouth 2 (two) times daily.  Dispense: 10 capsule; Refill: 0     Allena Hamilton, MD "

## 2024-05-14 NOTE — Telephone Encounter (Signed)
 If she does not have a doctor, then yes should would need to be seen to get treatment.

## 2024-05-14 NOTE — Patient Instructions (Signed)
 Saline nasal spray - flush nose 1-2x/day  Nasacort nasal spray - 2 sprays each nostril one time per day.

## 2024-05-15 ENCOUNTER — Ambulatory Visit: Payer: Self-pay | Admitting: Internal Medicine

## 2024-05-20 ENCOUNTER — Encounter: Payer: Self-pay | Admitting: Internal Medicine

## 2024-05-20 DIAGNOSIS — R0981 Nasal congestion: Secondary | ICD-10-CM | POA: Insufficient documentation

## 2024-05-20 NOTE — Assessment & Plan Note (Signed)
 Followed by Dr Cesario.  Had f/u with Dr Cesario 04/02/24 - NYHA I symptoms. She has had good response to mavacamten therapy with significant reduction in her left ventricular outflow tract gradient by last TTE with normal left ventricular ejection fraction. No changes in medication. Follow up one year.

## 2024-05-20 NOTE — Assessment & Plan Note (Signed)
 S/p septal myomectomy.  S/p ICD placement.  Followed by cardiology/Dr Cesario. Stable. Had ECHO 06/08/23 -calculated EF 50% with indeterminate diastolic function, trivial AR, mild MR, trivial PR and mild TR. ECHO 10/2023 - stable.  Had f/u with Dr Cesario 04/02/24 - NYHA I symptoms. She has had good response to mavacamten therapy with significant reduction in her left ventricular outflow tract gradient by last TTE with normal left ventricular ejection fraction. No changes in medication. Follow up one year.

## 2024-05-20 NOTE — Assessment & Plan Note (Signed)
 Continues on protonix. No upper symptoms reported.

## 2024-05-20 NOTE — Assessment & Plan Note (Signed)
 Followed by Dr Regino Schultze. Stable.

## 2024-05-20 NOTE — Assessment & Plan Note (Signed)
 Increased stress.  Discussed.  Seeing psychiatry. Continues on wellbutrin and trazodone . Continue f/u with psychiatry.

## 2024-05-20 NOTE — Assessment & Plan Note (Signed)
 On thyroid replacement.  Follow tsh.

## 2024-05-20 NOTE — Assessment & Plan Note (Signed)
 Has been evaluated by hematology.  Previously received iron infusions.  Follow cbc and iron studies.

## 2024-05-20 NOTE — Assessment & Plan Note (Signed)
 Vitamin D  level 02/2024 - wnl.

## 2024-05-20 NOTE — Assessment & Plan Note (Signed)
 Discussed today - increased stress - home/family stress.  Seeing Dr Vincente. Continues on wellbutrin. Continue f/u with psychiatry. Notify me if feels needs further intervention.

## 2024-05-20 NOTE — Assessment & Plan Note (Signed)
Follow cbc.  Last check wnl.  

## 2024-05-20 NOTE — Assessment & Plan Note (Signed)
Blood pressure doing well on no medication.  Follow.   

## 2024-05-20 NOTE — Assessment & Plan Note (Signed)
 Nasal congestion and sore throat as outlined. Some cough. Flu swab positive for influenza A. Treat with tamiflu . Stay hydrated. Saline nasal spray and steroid nasal spray as directed. Rx for azithromycin  given if needed for worsening symptoms. Will hold on refilling. Follow.  Call with update.

## 2024-06-06 ENCOUNTER — Encounter: Payer: Self-pay | Admitting: Oncology

## 2024-06-11 ENCOUNTER — Ambulatory Visit: Admitting: Dermatology

## 2024-06-13 ENCOUNTER — Ambulatory Visit: Admitting: Dermatology

## 2024-07-04 ENCOUNTER — Ambulatory Visit: Admitting: Dermatology

## 2024-07-16 ENCOUNTER — Ambulatory Visit: Admitting: Internal Medicine
